# Patient Record
Sex: Male | Born: 1935 | Race: White | Hispanic: No | Smoking: Former smoker
Health system: Southern US, Community
[De-identification: ages and names within clinical notes are randomized; demographics above are authoritative.]

## PROBLEM LIST (undated history)

## (undated) DIAGNOSIS — R0600 Dyspnea, unspecified: Secondary | ICD-10-CM

## (undated) DIAGNOSIS — R234 Changes in skin texture: Secondary | ICD-10-CM

## (undated) DIAGNOSIS — E7800 Pure hypercholesterolemia, unspecified: Secondary | ICD-10-CM

## (undated) DIAGNOSIS — F40240 Claustrophobia: Secondary | ICD-10-CM

## (undated) DIAGNOSIS — I219 Acute myocardial infarction, unspecified: Secondary | ICD-10-CM

## (undated) DIAGNOSIS — H269 Unspecified cataract: Secondary | ICD-10-CM

## (undated) DIAGNOSIS — I10 Essential (primary) hypertension: Secondary | ICD-10-CM

## (undated) DIAGNOSIS — H9319 Tinnitus, unspecified ear: Secondary | ICD-10-CM

## (undated) DIAGNOSIS — K219 Gastro-esophageal reflux disease without esophagitis: Secondary | ICD-10-CM

## (undated) DIAGNOSIS — F32A Depression, unspecified: Secondary | ICD-10-CM

## (undated) DIAGNOSIS — M1990 Unspecified osteoarthritis, unspecified site: Secondary | ICD-10-CM

## (undated) DIAGNOSIS — I351 Nonrheumatic aortic (valve) insufficiency: Secondary | ICD-10-CM

## (undated) DIAGNOSIS — I2510 Atherosclerotic heart disease of native coronary artery without angina pectoris: Secondary | ICD-10-CM

## (undated) DIAGNOSIS — F329 Major depressive disorder, single episode, unspecified: Secondary | ICD-10-CM

## (undated) DIAGNOSIS — E210 Primary hyperparathyroidism: Secondary | ICD-10-CM

## (undated) DIAGNOSIS — E039 Hypothyroidism, unspecified: Secondary | ICD-10-CM

## (undated) DIAGNOSIS — Z87442 Personal history of urinary calculi: Secondary | ICD-10-CM

## (undated) DIAGNOSIS — K8020 Calculus of gallbladder without cholecystitis without obstruction: Secondary | ICD-10-CM

## (undated) DIAGNOSIS — F419 Anxiety disorder, unspecified: Secondary | ICD-10-CM

## (undated) DIAGNOSIS — N400 Enlarged prostate without lower urinary tract symptoms: Secondary | ICD-10-CM

## (undated) HISTORY — PX: OTHER SURGICAL HISTORY: SHX169

## (undated) HISTORY — PX: CORONARY ANGIOPLASTY: SHX604

## (undated) HISTORY — PX: TONSILLECTOMY: SUR1361

## (undated) HISTORY — PX: EYE SURGERY: SHX253

---

## 2015-08-29 DIAGNOSIS — E039 Hypothyroidism, unspecified: Secondary | ICD-10-CM | POA: Diagnosis not present

## 2015-08-29 DIAGNOSIS — Z9181 History of falling: Secondary | ICD-10-CM | POA: Diagnosis not present

## 2015-08-29 DIAGNOSIS — R079 Chest pain, unspecified: Secondary | ICD-10-CM | POA: Diagnosis not present

## 2015-08-29 DIAGNOSIS — I10 Essential (primary) hypertension: Secondary | ICD-10-CM | POA: Diagnosis not present

## 2015-08-29 DIAGNOSIS — Z0000 Encounter for general adult medical examination without abnormal findings: Secondary | ICD-10-CM | POA: Diagnosis not present

## 2015-09-10 DIAGNOSIS — R079 Chest pain, unspecified: Secondary | ICD-10-CM | POA: Diagnosis not present

## 2015-09-16 DIAGNOSIS — Z23 Encounter for immunization: Secondary | ICD-10-CM | POA: Diagnosis not present

## 2015-09-16 DIAGNOSIS — R141 Gas pain: Secondary | ICD-10-CM | POA: Diagnosis not present

## 2016-01-07 DIAGNOSIS — M1712 Unilateral primary osteoarthritis, left knee: Secondary | ICD-10-CM | POA: Diagnosis not present

## 2016-02-05 DIAGNOSIS — M1712 Unilateral primary osteoarthritis, left knee: Secondary | ICD-10-CM | POA: Diagnosis not present

## 2016-02-10 DIAGNOSIS — H04203 Unspecified epiphora, bilateral lacrimal glands: Secondary | ICD-10-CM | POA: Diagnosis not present

## 2016-02-10 DIAGNOSIS — H35373 Puckering of macula, bilateral: Secondary | ICD-10-CM | POA: Diagnosis not present

## 2016-03-04 DIAGNOSIS — H04123 Dry eye syndrome of bilateral lacrimal glands: Secondary | ICD-10-CM | POA: Diagnosis not present

## 2016-03-17 DIAGNOSIS — L309 Dermatitis, unspecified: Secondary | ICD-10-CM | POA: Diagnosis not present

## 2016-03-25 DIAGNOSIS — L309 Dermatitis, unspecified: Secondary | ICD-10-CM | POA: Diagnosis not present

## 2016-04-14 DIAGNOSIS — H0289 Other specified disorders of eyelid: Secondary | ICD-10-CM | POA: Diagnosis not present

## 2016-04-14 DIAGNOSIS — L309 Dermatitis, unspecified: Secondary | ICD-10-CM | POA: Diagnosis not present

## 2016-04-14 DIAGNOSIS — H04223 Epiphora due to insufficient drainage, bilateral lacrimal glands: Secondary | ICD-10-CM | POA: Diagnosis not present

## 2016-04-14 DIAGNOSIS — H02105 Unspecified ectropion of left lower eyelid: Secondary | ICD-10-CM | POA: Diagnosis not present

## 2016-04-14 DIAGNOSIS — H02102 Unspecified ectropion of right lower eyelid: Secondary | ICD-10-CM | POA: Diagnosis not present

## 2016-04-14 DIAGNOSIS — H02223 Mechanical lagophthalmos right eye, unspecified eyelid: Secondary | ICD-10-CM | POA: Diagnosis not present

## 2016-04-30 DIAGNOSIS — E039 Hypothyroidism, unspecified: Secondary | ICD-10-CM | POA: Diagnosis not present

## 2016-04-30 DIAGNOSIS — R5383 Other fatigue: Secondary | ICD-10-CM | POA: Diagnosis not present

## 2016-04-30 DIAGNOSIS — E785 Hyperlipidemia, unspecified: Secondary | ICD-10-CM | POA: Diagnosis not present

## 2016-04-30 DIAGNOSIS — Z79899 Other long term (current) drug therapy: Secondary | ICD-10-CM | POA: Diagnosis not present

## 2016-04-30 DIAGNOSIS — Z0000 Encounter for general adult medical examination without abnormal findings: Secondary | ICD-10-CM | POA: Diagnosis not present

## 2016-04-30 DIAGNOSIS — D510 Vitamin B12 deficiency anemia due to intrinsic factor deficiency: Secondary | ICD-10-CM | POA: Diagnosis not present

## 2016-05-08 DIAGNOSIS — H02102 Unspecified ectropion of right lower eyelid: Secondary | ICD-10-CM | POA: Diagnosis not present

## 2016-05-08 DIAGNOSIS — H02105 Unspecified ectropion of left lower eyelid: Secondary | ICD-10-CM | POA: Diagnosis not present

## 2016-05-14 DIAGNOSIS — I10 Essential (primary) hypertension: Secondary | ICD-10-CM | POA: Diagnosis not present

## 2016-05-21 DIAGNOSIS — I10 Essential (primary) hypertension: Secondary | ICD-10-CM | POA: Diagnosis not present

## 2016-05-28 DIAGNOSIS — I10 Essential (primary) hypertension: Secondary | ICD-10-CM | POA: Diagnosis not present

## 2016-05-28 DIAGNOSIS — N451 Epididymitis: Secondary | ICD-10-CM | POA: Diagnosis not present

## 2016-05-28 DIAGNOSIS — N289 Disorder of kidney and ureter, unspecified: Secondary | ICD-10-CM | POA: Diagnosis not present

## 2016-05-29 DIAGNOSIS — R1011 Right upper quadrant pain: Secondary | ICD-10-CM | POA: Diagnosis not present

## 2016-05-29 DIAGNOSIS — K760 Fatty (change of) liver, not elsewhere classified: Secondary | ICD-10-CM | POA: Diagnosis not present

## 2016-05-29 DIAGNOSIS — R1031 Right lower quadrant pain: Secondary | ICD-10-CM | POA: Diagnosis not present

## 2016-05-29 DIAGNOSIS — R1013 Epigastric pain: Secondary | ICD-10-CM | POA: Diagnosis not present

## 2016-05-31 DIAGNOSIS — Z87891 Personal history of nicotine dependence: Secondary | ICD-10-CM | POA: Diagnosis not present

## 2016-05-31 DIAGNOSIS — K219 Gastro-esophageal reflux disease without esophagitis: Secondary | ICD-10-CM

## 2016-05-31 DIAGNOSIS — R0602 Shortness of breath: Secondary | ICD-10-CM | POA: Diagnosis not present

## 2016-05-31 DIAGNOSIS — R109 Unspecified abdominal pain: Secondary | ICD-10-CM | POA: Diagnosis not present

## 2016-05-31 DIAGNOSIS — I10 Essential (primary) hypertension: Secondary | ICD-10-CM | POA: Diagnosis not present

## 2016-05-31 DIAGNOSIS — R072 Precordial pain: Secondary | ICD-10-CM | POA: Diagnosis not present

## 2016-05-31 DIAGNOSIS — Z2821 Immunization not carried out because of patient refusal: Secondary | ICD-10-CM | POA: Diagnosis not present

## 2016-05-31 DIAGNOSIS — R7989 Other specified abnormal findings of blood chemistry: Secondary | ICD-10-CM | POA: Diagnosis not present

## 2016-05-31 DIAGNOSIS — E7800 Pure hypercholesterolemia, unspecified: Secondary | ICD-10-CM | POA: Diagnosis not present

## 2016-05-31 DIAGNOSIS — R079 Chest pain, unspecified: Secondary | ICD-10-CM | POA: Diagnosis not present

## 2016-05-31 DIAGNOSIS — Z7982 Long term (current) use of aspirin: Secondary | ICD-10-CM | POA: Diagnosis not present

## 2016-05-31 DIAGNOSIS — Z8679 Personal history of other diseases of the circulatory system: Secondary | ICD-10-CM | POA: Diagnosis not present

## 2016-05-31 DIAGNOSIS — K760 Fatty (change of) liver, not elsewhere classified: Secondary | ICD-10-CM | POA: Diagnosis not present

## 2016-05-31 DIAGNOSIS — R001 Bradycardia, unspecified: Secondary | ICD-10-CM | POA: Diagnosis not present

## 2016-05-31 DIAGNOSIS — E039 Hypothyroidism, unspecified: Secondary | ICD-10-CM | POA: Diagnosis not present

## 2016-05-31 DIAGNOSIS — Z79899 Other long term (current) drug therapy: Secondary | ICD-10-CM | POA: Diagnosis not present

## 2016-06-01 DIAGNOSIS — Z79899 Other long term (current) drug therapy: Secondary | ICD-10-CM | POA: Diagnosis not present

## 2016-06-01 DIAGNOSIS — M1990 Unspecified osteoarthritis, unspecified site: Secondary | ICD-10-CM | POA: Diagnosis not present

## 2016-06-01 DIAGNOSIS — Z87891 Personal history of nicotine dependence: Secondary | ICD-10-CM | POA: Diagnosis not present

## 2016-06-01 DIAGNOSIS — E7800 Pure hypercholesterolemia, unspecified: Secondary | ICD-10-CM | POA: Diagnosis not present

## 2016-06-01 DIAGNOSIS — I25118 Atherosclerotic heart disease of native coronary artery with other forms of angina pectoris: Secondary | ICD-10-CM | POA: Diagnosis not present

## 2016-06-01 DIAGNOSIS — I10 Essential (primary) hypertension: Secondary | ICD-10-CM | POA: Diagnosis not present

## 2016-06-01 DIAGNOSIS — R0602 Shortness of breath: Secondary | ICD-10-CM | POA: Diagnosis not present

## 2016-06-01 DIAGNOSIS — R001 Bradycardia, unspecified: Secondary | ICD-10-CM | POA: Diagnosis not present

## 2016-06-01 DIAGNOSIS — E039 Hypothyroidism, unspecified: Secondary | ICD-10-CM | POA: Diagnosis not present

## 2016-06-01 DIAGNOSIS — K219 Gastro-esophageal reflux disease without esophagitis: Secondary | ICD-10-CM | POA: Diagnosis not present

## 2016-06-01 DIAGNOSIS — I200 Unstable angina: Secondary | ICD-10-CM | POA: Diagnosis not present

## 2016-06-01 DIAGNOSIS — Z2821 Immunization not carried out because of patient refusal: Secondary | ICD-10-CM | POA: Diagnosis not present

## 2016-06-01 DIAGNOSIS — I25119 Atherosclerotic heart disease of native coronary artery with unspecified angina pectoris: Secondary | ICD-10-CM | POA: Diagnosis not present

## 2016-06-01 DIAGNOSIS — Z8679 Personal history of other diseases of the circulatory system: Secondary | ICD-10-CM | POA: Diagnosis not present

## 2016-06-01 DIAGNOSIS — R079 Chest pain, unspecified: Secondary | ICD-10-CM | POA: Diagnosis not present

## 2016-06-01 DIAGNOSIS — K760 Fatty (change of) liver, not elsewhere classified: Secondary | ICD-10-CM | POA: Diagnosis not present

## 2016-06-01 DIAGNOSIS — R7989 Other specified abnormal findings of blood chemistry: Secondary | ICD-10-CM | POA: Diagnosis not present

## 2016-06-01 DIAGNOSIS — R109 Unspecified abdominal pain: Secondary | ICD-10-CM | POA: Diagnosis not present

## 2016-06-01 DIAGNOSIS — Z7982 Long term (current) use of aspirin: Secondary | ICD-10-CM | POA: Diagnosis not present

## 2016-06-05 DIAGNOSIS — G4700 Insomnia, unspecified: Secondary | ICD-10-CM | POA: Diagnosis not present

## 2016-06-05 DIAGNOSIS — I259 Chronic ischemic heart disease, unspecified: Secondary | ICD-10-CM | POA: Diagnosis not present

## 2016-06-05 DIAGNOSIS — N289 Disorder of kidney and ureter, unspecified: Secondary | ICD-10-CM | POA: Diagnosis not present

## 2016-06-05 DIAGNOSIS — K760 Fatty (change of) liver, not elsewhere classified: Secondary | ICD-10-CM | POA: Diagnosis not present

## 2016-06-13 HISTORY — PX: OTHER SURGICAL HISTORY: SHX169

## 2016-06-17 DIAGNOSIS — K219 Gastro-esophageal reflux disease without esophagitis: Secondary | ICD-10-CM | POA: Diagnosis not present

## 2016-06-18 DIAGNOSIS — H25811 Combined forms of age-related cataract, right eye: Secondary | ICD-10-CM | POA: Diagnosis not present

## 2016-06-19 DIAGNOSIS — R001 Bradycardia, unspecified: Secondary | ICD-10-CM | POA: Diagnosis not present

## 2016-06-25 DIAGNOSIS — I209 Angina pectoris, unspecified: Secondary | ICD-10-CM | POA: Diagnosis not present

## 2016-06-25 DIAGNOSIS — E782 Mixed hyperlipidemia: Secondary | ICD-10-CM | POA: Diagnosis not present

## 2016-06-25 DIAGNOSIS — I25119 Atherosclerotic heart disease of native coronary artery with unspecified angina pectoris: Secondary | ICD-10-CM | POA: Diagnosis not present

## 2016-06-25 DIAGNOSIS — I10 Essential (primary) hypertension: Secondary | ICD-10-CM | POA: Diagnosis not present

## 2016-06-25 DIAGNOSIS — I351 Nonrheumatic aortic (valve) insufficiency: Secondary | ICD-10-CM | POA: Diagnosis not present

## 2016-06-30 DIAGNOSIS — R001 Bradycardia, unspecified: Secondary | ICD-10-CM | POA: Diagnosis not present

## 2016-07-01 DIAGNOSIS — R001 Bradycardia, unspecified: Secondary | ICD-10-CM | POA: Diagnosis not present

## 2016-07-06 DIAGNOSIS — I10 Essential (primary) hypertension: Secondary | ICD-10-CM | POA: Diagnosis not present

## 2016-07-06 DIAGNOSIS — M47812 Spondylosis without myelopathy or radiculopathy, cervical region: Secondary | ICD-10-CM | POA: Diagnosis not present

## 2016-08-06 DIAGNOSIS — I10 Essential (primary) hypertension: Secondary | ICD-10-CM | POA: Diagnosis not present

## 2016-08-06 DIAGNOSIS — I25119 Atherosclerotic heart disease of native coronary artery with unspecified angina pectoris: Secondary | ICD-10-CM | POA: Diagnosis not present

## 2016-08-06 DIAGNOSIS — G4733 Obstructive sleep apnea (adult) (pediatric): Secondary | ICD-10-CM | POA: Diagnosis not present

## 2016-08-06 DIAGNOSIS — E782 Mixed hyperlipidemia: Secondary | ICD-10-CM | POA: Diagnosis not present

## 2016-08-06 DIAGNOSIS — I351 Nonrheumatic aortic (valve) insufficiency: Secondary | ICD-10-CM | POA: Diagnosis not present

## 2016-08-10 DIAGNOSIS — H2511 Age-related nuclear cataract, right eye: Secondary | ICD-10-CM | POA: Diagnosis not present

## 2016-08-10 DIAGNOSIS — H25811 Combined forms of age-related cataract, right eye: Secondary | ICD-10-CM | POA: Diagnosis not present

## 2016-08-26 DIAGNOSIS — K760 Fatty (change of) liver, not elsewhere classified: Secondary | ICD-10-CM | POA: Diagnosis not present

## 2016-08-31 DIAGNOSIS — Z23 Encounter for immunization: Secondary | ICD-10-CM | POA: Diagnosis not present

## 2016-08-31 DIAGNOSIS — E785 Hyperlipidemia, unspecified: Secondary | ICD-10-CM | POA: Diagnosis not present

## 2016-08-31 DIAGNOSIS — Z0000 Encounter for general adult medical examination without abnormal findings: Secondary | ICD-10-CM | POA: Diagnosis not present

## 2016-08-31 DIAGNOSIS — N289 Disorder of kidney and ureter, unspecified: Secondary | ICD-10-CM | POA: Diagnosis not present

## 2016-09-08 DIAGNOSIS — R011 Cardiac murmur, unspecified: Secondary | ICD-10-CM | POA: Diagnosis not present

## 2016-09-11 DIAGNOSIS — H04123 Dry eye syndrome of bilateral lacrimal glands: Secondary | ICD-10-CM | POA: Diagnosis not present

## 2016-09-15 DIAGNOSIS — I351 Nonrheumatic aortic (valve) insufficiency: Secondary | ICD-10-CM | POA: Diagnosis not present

## 2016-09-15 DIAGNOSIS — G4733 Obstructive sleep apnea (adult) (pediatric): Secondary | ICD-10-CM | POA: Diagnosis not present

## 2016-09-15 DIAGNOSIS — I25119 Atherosclerotic heart disease of native coronary artery with unspecified angina pectoris: Secondary | ICD-10-CM | POA: Diagnosis not present

## 2016-09-15 DIAGNOSIS — E782 Mixed hyperlipidemia: Secondary | ICD-10-CM | POA: Diagnosis not present

## 2016-09-15 DIAGNOSIS — I10 Essential (primary) hypertension: Secondary | ICD-10-CM | POA: Diagnosis not present

## 2016-09-16 DIAGNOSIS — K219 Gastro-esophageal reflux disease without esophagitis: Secondary | ICD-10-CM | POA: Diagnosis not present

## 2016-09-18 DIAGNOSIS — I10 Essential (primary) hypertension: Secondary | ICD-10-CM | POA: Diagnosis not present

## 2016-09-18 DIAGNOSIS — G4733 Obstructive sleep apnea (adult) (pediatric): Secondary | ICD-10-CM | POA: Diagnosis not present

## 2016-09-18 DIAGNOSIS — I25119 Atherosclerotic heart disease of native coronary artery with unspecified angina pectoris: Secondary | ICD-10-CM | POA: Diagnosis not present

## 2016-09-18 DIAGNOSIS — I351 Nonrheumatic aortic (valve) insufficiency: Secondary | ICD-10-CM | POA: Diagnosis not present

## 2016-09-18 DIAGNOSIS — E782 Mixed hyperlipidemia: Secondary | ICD-10-CM | POA: Diagnosis not present

## 2016-09-24 DIAGNOSIS — I25119 Atherosclerotic heart disease of native coronary artery with unspecified angina pectoris: Secondary | ICD-10-CM | POA: Diagnosis not present

## 2016-10-12 DIAGNOSIS — I25119 Atherosclerotic heart disease of native coronary artery with unspecified angina pectoris: Secondary | ICD-10-CM | POA: Diagnosis not present

## 2016-10-12 DIAGNOSIS — I351 Nonrheumatic aortic (valve) insufficiency: Secondary | ICD-10-CM | POA: Diagnosis not present

## 2016-10-12 DIAGNOSIS — E782 Mixed hyperlipidemia: Secondary | ICD-10-CM | POA: Diagnosis not present

## 2016-10-12 DIAGNOSIS — I209 Angina pectoris, unspecified: Secondary | ICD-10-CM | POA: Diagnosis not present

## 2016-10-12 DIAGNOSIS — I10 Essential (primary) hypertension: Secondary | ICD-10-CM | POA: Diagnosis not present

## 2016-10-21 DIAGNOSIS — R001 Bradycardia, unspecified: Secondary | ICD-10-CM | POA: Diagnosis not present

## 2016-10-23 DIAGNOSIS — I209 Angina pectoris, unspecified: Secondary | ICD-10-CM | POA: Diagnosis not present

## 2016-10-23 DIAGNOSIS — I25119 Atherosclerotic heart disease of native coronary artery with unspecified angina pectoris: Secondary | ICD-10-CM | POA: Diagnosis not present

## 2016-10-23 DIAGNOSIS — E782 Mixed hyperlipidemia: Secondary | ICD-10-CM | POA: Diagnosis not present

## 2016-10-23 DIAGNOSIS — I10 Essential (primary) hypertension: Secondary | ICD-10-CM | POA: Diagnosis not present

## 2016-10-23 DIAGNOSIS — I351 Nonrheumatic aortic (valve) insufficiency: Secondary | ICD-10-CM | POA: Diagnosis not present

## 2016-11-04 DIAGNOSIS — H04223 Epiphora due to insufficient drainage, bilateral lacrimal glands: Secondary | ICD-10-CM | POA: Diagnosis not present

## 2016-11-04 DIAGNOSIS — H04551 Acquired stenosis of right nasolacrimal duct: Secondary | ICD-10-CM | POA: Diagnosis not present

## 2016-11-25 DIAGNOSIS — I25119 Atherosclerotic heart disease of native coronary artery with unspecified angina pectoris: Secondary | ICD-10-CM | POA: Diagnosis not present

## 2016-11-25 DIAGNOSIS — I10 Essential (primary) hypertension: Secondary | ICD-10-CM | POA: Diagnosis not present

## 2016-11-25 DIAGNOSIS — I351 Nonrheumatic aortic (valve) insufficiency: Secondary | ICD-10-CM | POA: Diagnosis not present

## 2016-11-25 DIAGNOSIS — E782 Mixed hyperlipidemia: Secondary | ICD-10-CM | POA: Diagnosis not present

## 2016-12-01 DIAGNOSIS — I10 Essential (primary) hypertension: Secondary | ICD-10-CM | POA: Diagnosis not present

## 2016-12-01 DIAGNOSIS — I25119 Atherosclerotic heart disease of native coronary artery with unspecified angina pectoris: Secondary | ICD-10-CM | POA: Diagnosis not present

## 2016-12-01 DIAGNOSIS — E782 Mixed hyperlipidemia: Secondary | ICD-10-CM | POA: Diagnosis not present

## 2016-12-01 DIAGNOSIS — I351 Nonrheumatic aortic (valve) insufficiency: Secondary | ICD-10-CM | POA: Diagnosis not present

## 2016-12-22 DIAGNOSIS — G4733 Obstructive sleep apnea (adult) (pediatric): Secondary | ICD-10-CM | POA: Diagnosis not present

## 2016-12-22 DIAGNOSIS — R001 Bradycardia, unspecified: Secondary | ICD-10-CM | POA: Diagnosis not present

## 2016-12-22 DIAGNOSIS — I351 Nonrheumatic aortic (valve) insufficiency: Secondary | ICD-10-CM | POA: Diagnosis not present

## 2016-12-22 DIAGNOSIS — I25119 Atherosclerotic heart disease of native coronary artery with unspecified angina pectoris: Secondary | ICD-10-CM | POA: Diagnosis not present

## 2016-12-22 DIAGNOSIS — I10 Essential (primary) hypertension: Secondary | ICD-10-CM | POA: Diagnosis not present

## 2017-01-07 DIAGNOSIS — C4441 Basal cell carcinoma of skin of scalp and neck: Secondary | ICD-10-CM | POA: Diagnosis not present

## 2017-01-07 DIAGNOSIS — L570 Actinic keratosis: Secondary | ICD-10-CM | POA: Diagnosis not present

## 2017-01-07 DIAGNOSIS — L820 Inflamed seborrheic keratosis: Secondary | ICD-10-CM | POA: Diagnosis not present

## 2017-01-25 DIAGNOSIS — K219 Gastro-esophageal reflux disease without esophagitis: Secondary | ICD-10-CM | POA: Diagnosis not present

## 2017-01-26 DIAGNOSIS — H04551 Acquired stenosis of right nasolacrimal duct: Secondary | ICD-10-CM | POA: Diagnosis not present

## 2017-01-26 DIAGNOSIS — H04221 Epiphora due to insufficient drainage, right lacrimal gland: Secondary | ICD-10-CM | POA: Diagnosis not present

## 2017-01-26 DIAGNOSIS — Z01818 Encounter for other preprocedural examination: Secondary | ICD-10-CM | POA: Diagnosis not present

## 2017-01-26 DIAGNOSIS — H0489 Other disorders of lacrimal system: Secondary | ICD-10-CM | POA: Diagnosis not present

## 2017-01-28 DIAGNOSIS — E782 Mixed hyperlipidemia: Secondary | ICD-10-CM | POA: Diagnosis not present

## 2017-01-28 DIAGNOSIS — I10 Essential (primary) hypertension: Secondary | ICD-10-CM | POA: Diagnosis not present

## 2017-01-28 DIAGNOSIS — G4733 Obstructive sleep apnea (adult) (pediatric): Secondary | ICD-10-CM | POA: Diagnosis not present

## 2017-01-28 DIAGNOSIS — I25119 Atherosclerotic heart disease of native coronary artery with unspecified angina pectoris: Secondary | ICD-10-CM | POA: Diagnosis not present

## 2017-02-10 DIAGNOSIS — Z7901 Long term (current) use of anticoagulants: Secondary | ICD-10-CM | POA: Diagnosis not present

## 2017-02-10 DIAGNOSIS — K760 Fatty (change of) liver, not elsewhere classified: Secondary | ICD-10-CM | POA: Diagnosis not present

## 2017-02-16 DIAGNOSIS — L400 Psoriasis vulgaris: Secondary | ICD-10-CM | POA: Diagnosis not present

## 2017-02-16 DIAGNOSIS — L821 Other seborrheic keratosis: Secondary | ICD-10-CM | POA: Diagnosis not present

## 2017-02-16 DIAGNOSIS — L578 Other skin changes due to chronic exposure to nonionizing radiation: Secondary | ICD-10-CM | POA: Diagnosis not present

## 2017-02-16 DIAGNOSIS — L570 Actinic keratosis: Secondary | ICD-10-CM | POA: Diagnosis not present

## 2017-02-16 DIAGNOSIS — C44319 Basal cell carcinoma of skin of other parts of face: Secondary | ICD-10-CM | POA: Diagnosis not present

## 2017-02-18 DIAGNOSIS — I25119 Atherosclerotic heart disease of native coronary artery with unspecified angina pectoris: Secondary | ICD-10-CM | POA: Diagnosis not present

## 2017-02-18 DIAGNOSIS — E039 Hypothyroidism, unspecified: Secondary | ICD-10-CM | POA: Diagnosis not present

## 2017-02-18 DIAGNOSIS — I10 Essential (primary) hypertension: Secondary | ICD-10-CM | POA: Diagnosis not present

## 2017-02-18 DIAGNOSIS — I340 Nonrheumatic mitral (valve) insufficiency: Secondary | ICD-10-CM | POA: Diagnosis not present

## 2017-02-18 DIAGNOSIS — E782 Mixed hyperlipidemia: Secondary | ICD-10-CM | POA: Diagnosis not present

## 2017-02-23 DIAGNOSIS — Z9181 History of falling: Secondary | ICD-10-CM | POA: Diagnosis not present

## 2017-02-23 DIAGNOSIS — I10 Essential (primary) hypertension: Secondary | ICD-10-CM | POA: Diagnosis not present

## 2017-02-23 DIAGNOSIS — N23 Unspecified renal colic: Secondary | ICD-10-CM | POA: Diagnosis not present

## 2017-02-23 DIAGNOSIS — Z23 Encounter for immunization: Secondary | ICD-10-CM | POA: Diagnosis not present

## 2017-02-23 DIAGNOSIS — K760 Fatty (change of) liver, not elsewhere classified: Secondary | ICD-10-CM | POA: Diagnosis not present

## 2017-03-04 DIAGNOSIS — N289 Disorder of kidney and ureter, unspecified: Secondary | ICD-10-CM | POA: Diagnosis not present

## 2017-03-22 DIAGNOSIS — K760 Fatty (change of) liver, not elsewhere classified: Secondary | ICD-10-CM | POA: Diagnosis not present

## 2017-04-26 DIAGNOSIS — K219 Gastro-esophageal reflux disease without esophagitis: Secondary | ICD-10-CM | POA: Diagnosis not present

## 2017-04-26 DIAGNOSIS — K760 Fatty (change of) liver, not elsewhere classified: Secondary | ICD-10-CM | POA: Diagnosis not present

## 2017-05-11 DIAGNOSIS — Z0000 Encounter for general adult medical examination without abnormal findings: Secondary | ICD-10-CM | POA: Diagnosis not present

## 2017-05-11 DIAGNOSIS — I259 Chronic ischemic heart disease, unspecified: Secondary | ICD-10-CM | POA: Diagnosis not present

## 2017-05-11 DIAGNOSIS — N189 Chronic kidney disease, unspecified: Secondary | ICD-10-CM | POA: Diagnosis not present

## 2017-05-21 DIAGNOSIS — I25119 Atherosclerotic heart disease of native coronary artery with unspecified angina pectoris: Secondary | ICD-10-CM | POA: Diagnosis not present

## 2017-05-21 DIAGNOSIS — I340 Nonrheumatic mitral (valve) insufficiency: Secondary | ICD-10-CM | POA: Diagnosis not present

## 2017-05-26 DIAGNOSIS — N189 Chronic kidney disease, unspecified: Secondary | ICD-10-CM | POA: Diagnosis not present

## 2017-05-31 DIAGNOSIS — C44319 Basal cell carcinoma of skin of other parts of face: Secondary | ICD-10-CM | POA: Diagnosis not present

## 2017-05-31 DIAGNOSIS — I781 Nevus, non-neoplastic: Secondary | ICD-10-CM | POA: Diagnosis not present

## 2017-05-31 DIAGNOSIS — L820 Inflamed seborrheic keratosis: Secondary | ICD-10-CM | POA: Diagnosis not present

## 2017-06-03 DIAGNOSIS — G4733 Obstructive sleep apnea (adult) (pediatric): Secondary | ICD-10-CM | POA: Diagnosis not present

## 2017-06-03 DIAGNOSIS — E782 Mixed hyperlipidemia: Secondary | ICD-10-CM | POA: Diagnosis not present

## 2017-06-03 DIAGNOSIS — I25119 Atherosclerotic heart disease of native coronary artery with unspecified angina pectoris: Secondary | ICD-10-CM | POA: Diagnosis not present

## 2017-06-03 DIAGNOSIS — I340 Nonrheumatic mitral (valve) insufficiency: Secondary | ICD-10-CM | POA: Diagnosis not present

## 2017-06-03 DIAGNOSIS — I10 Essential (primary) hypertension: Secondary | ICD-10-CM | POA: Diagnosis not present

## 2017-06-14 DIAGNOSIS — N200 Calculus of kidney: Secondary | ICD-10-CM | POA: Diagnosis not present

## 2017-06-14 DIAGNOSIS — I2510 Atherosclerotic heart disease of native coronary artery without angina pectoris: Secondary | ICD-10-CM | POA: Diagnosis not present

## 2017-06-14 DIAGNOSIS — N400 Enlarged prostate without lower urinary tract symptoms: Secondary | ICD-10-CM | POA: Diagnosis not present

## 2017-06-14 DIAGNOSIS — I340 Nonrheumatic mitral (valve) insufficiency: Secondary | ICD-10-CM | POA: Diagnosis not present

## 2017-06-14 DIAGNOSIS — N183 Chronic kidney disease, stage 3 (moderate): Secondary | ICD-10-CM | POA: Diagnosis not present

## 2017-06-22 DIAGNOSIS — N400 Enlarged prostate without lower urinary tract symptoms: Secondary | ICD-10-CM | POA: Diagnosis not present

## 2017-06-22 DIAGNOSIS — K7581 Nonalcoholic steatohepatitis (NASH): Secondary | ICD-10-CM | POA: Diagnosis not present

## 2017-06-22 DIAGNOSIS — I2510 Atherosclerotic heart disease of native coronary artery without angina pectoris: Secondary | ICD-10-CM | POA: Diagnosis not present

## 2017-06-22 DIAGNOSIS — N183 Chronic kidney disease, stage 3 (moderate): Secondary | ICD-10-CM | POA: Diagnosis not present

## 2017-06-22 DIAGNOSIS — Z87442 Personal history of urinary calculi: Secondary | ICD-10-CM | POA: Diagnosis not present

## 2017-06-30 DIAGNOSIS — N183 Chronic kidney disease, stage 3 (moderate): Secondary | ICD-10-CM | POA: Diagnosis not present

## 2017-07-05 DIAGNOSIS — H04221 Epiphora due to insufficient drainage, right lacrimal gland: Secondary | ICD-10-CM | POA: Diagnosis not present

## 2017-07-07 DIAGNOSIS — N390 Urinary tract infection, site not specified: Secondary | ICD-10-CM | POA: Diagnosis not present

## 2017-07-27 DIAGNOSIS — Z01818 Encounter for other preprocedural examination: Secondary | ICD-10-CM | POA: Diagnosis not present

## 2017-07-27 DIAGNOSIS — H04551 Acquired stenosis of right nasolacrimal duct: Secondary | ICD-10-CM | POA: Diagnosis not present

## 2017-08-06 DIAGNOSIS — E210 Primary hyperparathyroidism: Secondary | ICD-10-CM | POA: Diagnosis not present

## 2017-08-09 ENCOUNTER — Other Ambulatory Visit (HOSPITAL_COMMUNITY): Payer: Self-pay | Admitting: Surgery

## 2017-08-09 DIAGNOSIS — E210 Primary hyperparathyroidism: Secondary | ICD-10-CM | POA: Diagnosis not present

## 2017-08-19 ENCOUNTER — Encounter (HOSPITAL_COMMUNITY)
Admission: RE | Admit: 2017-08-19 | Discharge: 2017-08-19 | Disposition: A | Discharge status: Home / Self Care | Payer: Medicare Other | Source: Ambulatory Visit | Attending: Surgery | Admitting: Surgery

## 2017-08-19 DIAGNOSIS — E210 Primary hyperparathyroidism: Secondary | ICD-10-CM | POA: Insufficient documentation

## 2017-08-19 MED ORDER — TECHNETIUM TC 99M SESTAMIBI GENERIC - CARDIOLITE
25.0000 | Freq: Once | INTRAVENOUS | Status: AC | PRN
  Administered 2017-08-19: 25 via INTRAVENOUS

## 2017-08-25 ENCOUNTER — Ambulatory Visit: Payer: Self-pay | Admitting: Surgery

## 2017-08-30 ENCOUNTER — Other Ambulatory Visit: Payer: Self-pay | Admitting: Pharmacist

## 2017-08-30 NOTE — Patient Outreach (Signed)
Incoming call from Ryan Mendoza in response to the [facility] Medication Adherence Campaign. Ryan Mendoza reports that he takes his atorvastatin once daily as directed. Denies any missed doses or barriers to medication adherence. Counsel patient on medication adherence. Also counsel patient on benefits of using mail order pharmacy for cost savings. Patient verbalizes understanding.  Ryan Mendoza denies any medication questions/concerns at this time.  [name], PharmD, [place] [facility] [phone]

## 2017-09-02 DIAGNOSIS — E039 Hypothyroidism, unspecified: Secondary | ICD-10-CM | POA: Diagnosis not present

## 2017-09-02 DIAGNOSIS — Z79899 Other long term (current) drug therapy: Secondary | ICD-10-CM | POA: Diagnosis not present

## 2017-09-02 DIAGNOSIS — Z23 Encounter for immunization: Secondary | ICD-10-CM | POA: Diagnosis not present

## 2017-09-02 DIAGNOSIS — N189 Chronic kidney disease, unspecified: Secondary | ICD-10-CM | POA: Diagnosis not present

## 2017-09-02 DIAGNOSIS — Z0000 Encounter for general adult medical examination without abnormal findings: Secondary | ICD-10-CM | POA: Diagnosis not present

## 2017-09-20 DIAGNOSIS — I2510 Atherosclerotic heart disease of native coronary artery without angina pectoris: Secondary | ICD-10-CM | POA: Diagnosis not present

## 2017-09-20 DIAGNOSIS — N200 Calculus of kidney: Secondary | ICD-10-CM | POA: Diagnosis not present

## 2017-09-20 DIAGNOSIS — I129 Hypertensive chronic kidney disease with stage 1 through stage 4 chronic kidney disease, or unspecified chronic kidney disease: Secondary | ICD-10-CM | POA: Diagnosis not present

## 2017-09-20 DIAGNOSIS — E210 Primary hyperparathyroidism: Secondary | ICD-10-CM | POA: Diagnosis not present

## 2017-09-20 DIAGNOSIS — N183 Chronic kidney disease, stage 3 (moderate): Secondary | ICD-10-CM | POA: Diagnosis not present

## 2017-09-22 NOTE — Patient Instructions (Signed)
Ryan Mendoza  09/22/2017   Your procedure is scheduled on: 09-28-17   Report to [facility] Main  Entrance Take [place] Elevators to 3rd floor to  [place] at 5:30 AM.   Call this number if you have problems the morning of surgery [phone]    Remember: ONLY 1 PERSON MAY GO WITH YOU TO SHORT STAY TO GET  READY MORNING OF [place].  Do not eat food or drink liquids :After Midnight.     Take these medicines the morning of surgery with A SIP OF WATER: Levothyroxine (Synthroid)                                You may not have any metal on your body including hair pins and              Piercings  Do not wear jewelry, make-up, lotions, powders or perfumes, deodorant             Men may shave face and neck.   Do not bring valuables to the hospital. [place].  Contacts, dentures or bridgework may not be worn into surgery.  Leave suitcase in the car. After surgery it may be brought to your room.                 Please read over the following fact sheets you were given: _____________________________________________________________________             [facility] - Preparing for Surgery Before surgery, you can play an important role.  Because skin is not sterile, your skin needs to be as free of germs as possible.  You can reduce the number of germs on your skin by washing with CHG (chlorahexidine gluconate) soap before surgery.  CHG is an antiseptic cleaner which kills germs and bonds with the skin to continue killing germs even after washing. Please DO NOT use if you have an allergy to CHG or antibacterial soaps.  If your skin becomes reddened/irritated stop using the CHG and inform your nurse when you arrive at Short Stay. Do not shave (including legs and underarms) for at least 48 hours prior to the first CHG shower.  You may shave your face/neck. Please follow these instructions carefully:  1.   Shower with CHG Soap the night before surgery and the  morning of Surgery.  2.  If you choose to wash your hair, wash your hair first as usual with your  normal  shampoo.  3.  After you shampoo, rinse your hair and body thoroughly to remove the  shampoo.                           4.  Use CHG as you would any other liquid soap.  You can apply chg directly  to the skin and wash                       Gently with a scrungie or clean washcloth.  5.  Apply the CHG Soap to your body ONLY FROM THE NECK DOWN.   Do not use on face/ open  Wound or open sores. Avoid contact with eyes, ears mouth and genitals (private parts).                       Wash face,  Genitals (private parts) with your normal soap.             6.  Wash thoroughly, paying special attention to the area where your surgery  will be performed.  7.  Thoroughly rinse your body with warm water from the neck down.  8.  DO NOT shower/wash with your normal soap after using and rinsing off  the CHG Soap.                9.  Pat yourself dry with a clean towel.            10.  Wear clean pajamas.            11.  Place clean sheets on your bed the night of your first shower and do not  sleep with pets. Day of Surgery : Do not apply any lotions/deodorants the morning of surgery.  Please wear clean clothes to the hospital/surgery center.  FAILURE TO FOLLOW THESE INSTRUCTIONS MAY RESULT IN THE CANCELLATION OF YOUR SURGERY PATIENT SIGNATURE_________________________________  NURSE SIGNATURE__________________________________  ________________________________________________________________________

## 2017-09-23 ENCOUNTER — Encounter (HOSPITAL_COMMUNITY)
Admission: RE | Admit: 2017-09-23 | Discharge: 2017-09-23 | Disposition: A | Discharge status: Home / Self Care | Payer: Medicare Other | Source: Ambulatory Visit | Attending: Surgery | Admitting: Surgery

## 2017-09-23 DIAGNOSIS — E210 Primary hyperparathyroidism: Secondary | ICD-10-CM | POA: Insufficient documentation

## 2017-09-23 DIAGNOSIS — Z01812 Encounter for preprocedural laboratory examination: Secondary | ICD-10-CM | POA: Insufficient documentation

## 2017-09-23 DIAGNOSIS — I10 Essential (primary) hypertension: Secondary | ICD-10-CM | POA: Insufficient documentation

## 2017-09-24 ENCOUNTER — Encounter (HOSPITAL_COMMUNITY): Payer: Self-pay

## 2017-09-24 ENCOUNTER — Encounter (HOSPITAL_COMMUNITY)
Admission: RE | Admit: 2017-09-24 | Discharge: 2017-09-24 | Disposition: A | Discharge status: Home / Self Care | Payer: Medicare Other | Source: Ambulatory Visit | Attending: Surgery | Admitting: Surgery

## 2017-09-24 DIAGNOSIS — Z01812 Encounter for preprocedural laboratory examination: Secondary | ICD-10-CM | POA: Diagnosis not present

## 2017-09-24 DIAGNOSIS — I10 Essential (primary) hypertension: Secondary | ICD-10-CM | POA: Diagnosis not present

## 2017-09-24 DIAGNOSIS — E210 Primary hyperparathyroidism: Secondary | ICD-10-CM | POA: Diagnosis not present

## 2017-09-24 HISTORY — DX: Pure hypercholesterolemia, unspecified: E78.00

## 2017-09-24 HISTORY — DX: Atherosclerotic heart disease of native coronary artery without angina pectoris: I25.10

## 2017-09-24 HISTORY — DX: Gastro-esophageal reflux disease without esophagitis: K21.9

## 2017-09-24 HISTORY — DX: Personal history of urinary calculi: Z87.442

## 2017-09-24 HISTORY — DX: Benign prostatic hyperplasia without lower urinary tract symptoms: N40.0

## 2017-09-24 HISTORY — DX: Calculus of gallbladder without cholecystitis without obstruction: K80.20

## 2017-09-24 HISTORY — DX: Unspecified osteoarthritis, unspecified site: M19.90

## 2017-09-24 HISTORY — DX: Essential (primary) hypertension: I10

## 2017-09-24 HISTORY — DX: Changes in skin texture: R23.4

## 2017-09-24 HISTORY — DX: Anxiety disorder, unspecified: F41.9

## 2017-09-24 HISTORY — DX: Unspecified cataract: H26.9

## 2017-09-24 HISTORY — DX: Hypothyroidism, unspecified: E03.9

## 2017-09-24 HISTORY — DX: Primary hyperparathyroidism: E21.0

## 2017-09-24 HISTORY — DX: Major depressive disorder, single episode, unspecified: F32.9

## 2017-09-24 HISTORY — DX: Tinnitus, unspecified ear: H93.19

## 2017-09-24 HISTORY — DX: Depression, unspecified: F32.A

## 2017-09-24 HISTORY — DX: Nonrheumatic aortic (valve) insufficiency: I35.1

## 2017-09-24 LAB — BASIC METABOLIC PANEL
Anion gap: 6 (ref 5–15)
BUN: 24 mg/dL — AB (ref 6–20)
CHLORIDE: 107 mmol/L (ref 101–111)
CO2: 27 mmol/L (ref 22–32)
CREATININE: 1.25 mg/dL — AB (ref 0.61–1.24)
Calcium: 10.3 mg/dL (ref 8.9–10.3)
GFR calc Af Amer: >60 mL/min (ref >60)
GFR calc non Af Amer: 53 mL/min — ABNORMAL LOW (ref >60)
GLUCOSE: 111 mg/dL — AB (ref 65–99)
POTASSIUM: 4.3 mmol/L (ref 3.5–5.1)
Sodium: 140 mmol/L (ref 135–145)

## 2017-09-24 LAB — CBC
HEMATOCRIT: 42.5 % (ref 39.0–52.0)
Hemoglobin: 14.4 g/dL (ref 13.0–17.0)
MCH: 29.4 pg (ref 26.0–34.0)
MCHC: 33.9 g/dL (ref 30.0–36.0)
MCV: 86.9 fL (ref 78.0–100.0)
Platelets: 164 10*3/uL (ref 150–400)
RBC: 4.89 MIL/uL (ref 4.22–5.81)
RDW: 13.9 % (ref 11.5–15.5)
WBC: 7.5 10*3/uL (ref 4.0–10.5)

## 2017-09-24 NOTE — Patient Instructions (Signed)
Ryan Mendoza  09/24/2017   Your procedure is scheduled on: Tuesday 09-28-17  Report to [facility] Main  Entrance Take [place]  elevators to 3rd floor to  [place] at 530 AM.   Call this number if you have problems the morning of surgery [phone]  Remember: ONLY 1 PERSON MAY GO WITH YOU TO SHORT STAY TO GET  READY MORNING OF [place].  Do not eat food or drink liquids :After Midnight.     Take these medicines the morning of surgery with A SIP OF WATER: clonidine, atorvastatin, eye drop if needed                               You may not have any metal on your body including hair pins and              piercings  Do not wear jewelry, make-up, lotions, powders or perfumes, deodorant             Do not wear nail polish.  Do not shave  48 hours prior to surgery.              Men may shave face and neck.   Do not bring valuables to the hospital. [place].  Contacts, dentures or bridgework may not be worn into surgery.  Leave suitcase in the car. After surgery it may be brought to your room.     Patients discharged the day of surgery will not be allowed to drive home.  Name and phone number of your driver: daughter cindy ellington cell [phone]  Special Instructions: N/A              Please read over the following fact sheets you were given: _____________________________________________________________________             [facility] - Preparing for Surgery Before surgery, you can play an important role.  Because skin is not sterile, your skin needs to be as free of germs as possible.  You can reduce the number of germs on your skin by washing with CHG (chlorahexidine gluconate) soap before surgery.  CHG is an antiseptic cleaner which kills germs and bonds with the skin to continue killing germs even after washing. Please DO NOT use if you have an allergy to CHG or antibacterial soaps.  If your  skin becomes reddened/irritated stop using the CHG and inform your nurse when you arrive at Short Stay. Do not shave (including legs and underarms) for at least 48 hours prior to the first CHG shower.  You may shave your face/neck. Please follow these instructions carefully:  1.  Shower with CHG Soap the night before surgery and the  morning of Surgery.  2.  If you choose to wash your hair, wash your hair first as usual with your  normal  shampoo.  3.  After you shampoo, rinse your hair and body thoroughly to remove the  shampoo.                           4.  Use CHG as you would any other liquid soap.  You can apply chg directly  to the skin and wash  Gently with a scrungie or clean washcloth.  5.  Apply the CHG Soap to your body ONLY FROM THE NECK DOWN.   Do not use on face/ open                           Wound or open sores. Avoid contact with eyes, ears mouth and genitals (private parts).                       Wash face,  Genitals (private parts) with your normal soap.             6.  Wash thoroughly, paying special attention to the area where your surgery  will be performed.  7.  Thoroughly rinse your body with warm water from the neck down.  8.  DO NOT shower/wash with your normal soap after using and rinsing off  the CHG Soap.                9.  Pat yourself dry with a clean towel.            10.  Wear clean pajamas.            11.  Place clean sheets on your bed the night of your first shower and do not  sleep with pets. Day of Surgery : Do not apply any lotions/deodorants the morning of surgery.  Please wear clean clothes to the hospital/surgery center.  FAILURE TO FOLLOW THESE INSTRUCTIONS MAY RESULT IN THE CANCELLATION OF YOUR SURGERY PATIENT SIGNATURE_________________________________  NURSE SIGNATURE__________________________________  ________________________________________________________________________

## 2017-09-24 NOTE — Progress Notes (Signed)
bmet results routed to dr gerkin epic inbasket by epic

## 2017-09-24 NOTE — Progress Notes (Signed)
   09/24/17 0948  OBSTRUCTIVE SLEEP APNEA  Have you ever been diagnosed with sleep apnea through a sleep study? No  Do you snore loudly (loud enough to be heard through closed doors)?  1  Do you often feel tired, fatigued, or sleepy during the daytime (such as falling asleep during driving or talking to someone)? 1  Has anyone observed you stop breathing during your sleep? 0  Do you have, or are you being treated for high blood pressure? 1  BMI more than 35 kg/m2? 0  Age > 50 (1-yes) 1  Neck circumference greater than:Male 16 inches or larger, Male 17inches or larger? 0  Male Gender (Yes=1) 1  Obstructive Sleep Apnea Score 5  Score 5 or greater  Results sent to PCP

## 2017-09-24 NOTE — Progress Notes (Addendum)
Received lov dr Donnetta Hutching 06-03-17 cardiology and placed on patient chart, unable to get [facility] records only lov dr Rutherford Guys note

## 2017-09-27 ENCOUNTER — Encounter (HOSPITAL_COMMUNITY): Payer: Self-pay | Admitting: Surgery

## 2017-09-27 DIAGNOSIS — E210 Primary hyperparathyroidism: Secondary | ICD-10-CM | POA: Diagnosis present

## 2017-09-27 HISTORY — DX: Primary hyperparathyroidism: E21.0

## 2017-09-27 NOTE — H&P (Addendum)
General Surgery [facility]  Ryan Mendoza DOB: December 07, 1934 Married / Language: English / Race: White Male   History of Present Illness   The patient is a 83 year old male who presents with primary hyperparathyroidism.  CC: primary hyperparathyroidism  Patient is referred by Dr. Jamal Maes for evaluation of suspected primary hyperparathyroidism. Patient's primary care physician is Dr. Lovette Cliche. Patient was noted on laboratory studies to have an elevated serum calcium level of 10.8. Subsequent laboratory showed an elevated intact PTH level of 84. Patient was being evaluated by nephrology for decreased renal function. Patient complains of chronic fatigue. He has had at least one episode of nephrolithiasis. He denies any bone or joint pain. He denies urinary frequency. Patient does have hypothyroidism and takes levothyroxine 50 g daily. There is no other family history of endocrine disease or endocrine neoplasms. Patient has had no prior surgery on the head or neck. He presents today accompanied by his daughter and his wife for further evaluation and recommendations. Patient has had no imaging studies.   Past Surgical History Cataract Surgery  Right. Coronary Artery Bypass Graft  Knee Surgery  Bilateral. Shoulder Surgery  Bilateral. TURP   Diagnostic Studies History Colonoscopy  1-5 years ago  Allergies No Known Allergies 08/06/2017 Allergies Reconciled   Medication History  Levothyroxine Sodium (50MCG Tablet, Oral) Active. CloNIDine HCl ER (0.1MG  Tablet ER 12HR, Oral) Active. Aspirin (81MG  Tablet DR, Oral) Active. Atorvastatin Calcium (40MG  Tablet, Oral) Active. Plavix (75MG  Tablet, Oral) Active. Nitroglycerin (0.4MG  Tab Sublingual, Sublingual) Active. Zantac (300MG  Tablet, Oral) Active. Norvasc (5MG  Tablet, Oral) Active. Medications Reconciled  Social History Alcohol use  Remotely quit alcohol use. Caffeine use   Coffee. Illicit drug use  Prefer to discuss with provider. Tobacco use  Former smoker.  Family History  Heart Disease  Brother, Father, Mother. Heart disease in male family member before age 24   Other Problems ( Gastric Ulcer  Heart murmur  Hepatitis  Hypercholesterolemia  Inguinal Hernia  Kidney Stone  Thyroid Disease     Review of Systems  General Present- Fatigue. Not Present- Appetite Loss, Chills, Fever, Night Sweats, Weight Gain and Weight Loss. Skin Not Present- Change in Wart/Mole, Dryness, Hives, Jaundice, New Lesions, Non-Healing Wounds, Rash and Ulcer. HEENT Present- Hearing Loss and Ringing in the Ears. Not Present- Earache, Hoarseness, Nose Bleed, Oral Ulcers, Seasonal Allergies, Sinus Pain, Sore Throat, Visual Disturbances, Wears glasses/contact lenses and Yellow Eyes. Respiratory Not Present- Bloody sputum, Chronic Cough, Difficulty Breathing, Snoring and Wheezing. Cardiovascular Present- Leg Cramps and Rapid Heart Rate. Not Present- Chest Pain, Difficulty Breathing Lying Down, Palpitations, Shortness of Breath and Swelling of Extremities. Gastrointestinal Present- Constipation. Not Present- Abdominal Pain, Bloating, Bloody Stool, Change in Bowel Habits, Chronic diarrhea, Difficulty Swallowing, Excessive gas, Gets full quickly at meals, Hemorrhoids, Indigestion, Nausea, Rectal Pain and Vomiting. Male Genitourinary Not Present- Blood in Urine, Change in Urinary Stream, Frequency, Impotence, Nocturia, Painful Urination, Urgency and Urine Leakage. Musculoskeletal Not Present- Back Pain, Joint Pain, Joint Stiffness, Muscle Pain, Muscle Weakness and Swelling of Extremities. Psychiatric Not Present- Anxiety, Bipolar, Change in Sleep Pattern, Depression, Fearful and Frequent crying. Hematology Not Present- Blood Thinners, Easy Bruising, Excessive bleeding, Gland problems, HIV and Persistent Infections.  Vitals Weight: 200 lb Height: 68in Height was reported by  patient. Body Surface Area: 2.04 m Body Mass Index: 30.41 kg/m  Temp.: 97.64F(Temporal)  Pulse: 74 (Regular)  BP: 132/70 (Sitting, Left Arm, Standard)  Physical Exam The physical exam findings are as  follows: Note:CONSTITUTIONAL See vital signs recorded above  GENERAL APPEARANCE Development: normal Nutritional status: normal Gross deformities: none  SKIN Rash, lesions, ulcers: none Induration, erythema: none Nodules: none palpable  EYES Conjunctiva and lids: normal Pupils: equal and reactive Iris: normal bilaterally  EARS, NOSE, MOUTH, THROAT External ears: no lesion or deformity External nose: no lesion or deformity Hearing: grossly normal Lips: no lesion or deformity Dentition: normal for age Oral mucosa: moist  NECK Symmetric: yes Trachea: midline Thyroid: no palpable nodules in the thyroid bed  CHEST Respiratory effort: normal Retraction or accessory muscle use: no Breath sounds: normal bilaterally Rales, rhonchi, wheeze: none  CARDIOVASCULAR Auscultation: regular rhythm, normal rate Murmurs: none Pulses: carotid and radial pulse 2+ palpable Lower extremity edema: none Lower extremity varicosities: none  MUSCULOSKELETAL Station and gait: normal Digits and nails: no clubbing or cyanosis Muscle strength: grossly normal all extremities Range of motion: grossly normal all extremities Deformity: none  LYMPHATIC Cervical: none palpable Supraclavicular: none palpable  PSYCHIATRIC Oriented to person, place, and time: yes Mood and affect: normal for situation Judgment and insight: appropriate for situation    Assessment & Plan  PRIMARY HYPERPARATHYROIDISM (E21.0)  Pt Education - Pamphlet Given - The Parathyroid Surgery Book: discussed with patient and provided information. Follow Up - Call [facility] office after tests / studies doneto discuss further plans  Patient presents on referral from his nephrologist for evaluation of suspected  primary hyperparathyroidism. He is accompanied by his wife and daughter. They are provided with written literature on parathyroid surgery to review at home.  Patient has elevated calcium and intact PTH levels. He has decreased renal function and complaints of chronic fatigue. Suspicion is raised of possible primary hyperparathyroidism.  I am going to obtain a 25-hydroxy vitamin D level. We will also order a 24-hour urine collection for calcium. Patient will be scheduled for a nuclear medicine parathyroid scan in the near future.  We will contact the patient with the results of these studies. Patient may require additional studies such as thyroid ultrasound or 4D CT scan of the neck. We will make that determination following the results of the above studies.  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  Plan minimally invasive out-patient surgery.  The risks and benefits of the procedure have been discussed at length with the patient.  The patient understands the proposed procedure, potential alternative treatments, and the course of recovery to be expected.  All of the patient's questions have been answered at this time.  The patient wishes to proceed with surgery.  Armandina Gemma, [place] [facility] Office: [phone]

## 2017-09-28 ENCOUNTER — Encounter (HOSPITAL_COMMUNITY)
Admission: RE | Disposition: A | Discharge status: Home / Self Care | Payer: Self-pay | Source: Ambulatory Visit | Attending: Surgery

## 2017-09-28 ENCOUNTER — Encounter (HOSPITAL_COMMUNITY): Payer: Self-pay | Admitting: *Deleted

## 2017-09-28 ENCOUNTER — Observation Stay (HOSPITAL_COMMUNITY)
Admission: RE | Admit: 2017-09-28 | Discharge: 2017-09-29 | Disposition: A | Discharge status: Home / Self Care | Payer: Medicare Other | Source: Ambulatory Visit | Attending: Surgery | Admitting: Surgery

## 2017-09-28 ENCOUNTER — Other Ambulatory Visit: Payer: Self-pay

## 2017-09-28 ENCOUNTER — Ambulatory Visit (HOSPITAL_COMMUNITY): Payer: Medicare Other | Admitting: Anesthesiology

## 2017-09-28 DIAGNOSIS — K759 Inflammatory liver disease, unspecified: Secondary | ICD-10-CM | POA: Insufficient documentation

## 2017-09-28 DIAGNOSIS — Z951 Presence of aortocoronary bypass graft: Secondary | ICD-10-CM | POA: Insufficient documentation

## 2017-09-28 DIAGNOSIS — E210 Primary hyperparathyroidism: Secondary | ICD-10-CM | POA: Diagnosis present

## 2017-09-28 DIAGNOSIS — D351 Benign neoplasm of parathyroid gland: Principal | ICD-10-CM | POA: Insufficient documentation

## 2017-09-28 DIAGNOSIS — Z87442 Personal history of urinary calculi: Secondary | ICD-10-CM | POA: Diagnosis not present

## 2017-09-28 DIAGNOSIS — I2510 Atherosclerotic heart disease of native coronary artery without angina pectoris: Secondary | ICD-10-CM | POA: Diagnosis not present

## 2017-09-28 DIAGNOSIS — E7800 Pure hypercholesterolemia, unspecified: Secondary | ICD-10-CM | POA: Insufficient documentation

## 2017-09-28 DIAGNOSIS — Z87891 Personal history of nicotine dependence: Secondary | ICD-10-CM | POA: Diagnosis not present

## 2017-09-28 DIAGNOSIS — Z79899 Other long term (current) drug therapy: Secondary | ICD-10-CM | POA: Insufficient documentation

## 2017-09-28 DIAGNOSIS — Z7982 Long term (current) use of aspirin: Secondary | ICD-10-CM | POA: Insufficient documentation

## 2017-09-28 DIAGNOSIS — R5382 Chronic fatigue, unspecified: Secondary | ICD-10-CM | POA: Insufficient documentation

## 2017-09-28 DIAGNOSIS — Z9841 Cataract extraction status, right eye: Secondary | ICD-10-CM | POA: Diagnosis not present

## 2017-09-28 DIAGNOSIS — K259 Gastric ulcer, unspecified as acute or chronic, without hemorrhage or perforation: Secondary | ICD-10-CM | POA: Insufficient documentation

## 2017-09-28 DIAGNOSIS — E041 Nontoxic single thyroid nodule: Secondary | ICD-10-CM | POA: Insufficient documentation

## 2017-09-28 DIAGNOSIS — I10 Essential (primary) hypertension: Secondary | ICD-10-CM | POA: Diagnosis not present

## 2017-09-28 HISTORY — PX: PARATHYROIDECTOMY: SHX19

## 2017-09-28 SURGERY — PARATHYROIDECTOMY
Anesthesia: General | Site: Neck | Laterality: Right

## 2017-09-28 MED ORDER — AMLODIPINE BESYLATE 5 MG PO TABS
5.0000 mg | ORAL_TABLET | Freq: Every day | ORAL | Status: DC
  Administered 2017-09-28: 5 mg via ORAL
  Filled 2017-09-28: qty 1

## 2017-09-28 MED ORDER — EPHEDRINE SULFATE-NACL 50-0.9 MG/10ML-% IV SOSY
PREFILLED_SYRINGE | INTRAVENOUS | Status: DC | PRN
Start: 2017-09-28 — End: 2017-09-28
  Administered 2017-09-28: 15 mg via INTRAVENOUS
  Administered 2017-09-28: 10 mg via INTRAVENOUS

## 2017-09-28 MED ORDER — LACTATED RINGERS IV SOLN
INTRAVENOUS | Status: DC | PRN
  Administered 2017-09-28 (×2): via INTRAVENOUS

## 2017-09-28 MED ORDER — ROCURONIUM BROMIDE 10 MG/ML (PF) SYRINGE
PREFILLED_SYRINGE | INTRAVENOUS | Status: DC | PRN
  Administered 2017-09-28: 10 mg via INTRAVENOUS
  Administered 2017-09-28: 40 mg via INTRAVENOUS

## 2017-09-28 MED ORDER — ONDANSETRON 4 MG PO TBDP: 4.0000 mg | ORAL_TABLET | Freq: Four times a day (QID) | ORAL | Status: DC | PRN

## 2017-09-28 MED ORDER — FENTANYL CITRATE (PF) 100 MCG/2ML IJ SOLN
INTRAMUSCULAR | Status: DC | PRN
  Administered 2017-09-28 (×2): 50 ug via INTRAVENOUS

## 2017-09-28 MED ORDER — CHLORHEXIDINE GLUCONATE CLOTH 2 % EX PADS: 6.0000 | MEDICATED_PAD | Freq: Once | CUTANEOUS | Status: DC

## 2017-09-28 MED ORDER — LIDOCAINE 2% (20 MG/ML) 5 ML SYRINGE
INTRAMUSCULAR | Status: AC
  Filled 2017-09-28: qty 5

## 2017-09-28 MED ORDER — GLYCOPYRROLATE 0.2 MG/ML IV SOSY
PREFILLED_SYRINGE | INTRAVENOUS | Status: AC
  Filled 2017-09-28: qty 5

## 2017-09-28 MED ORDER — PROPOFOL 10 MG/ML IV BOLUS
INTRAVENOUS | Status: AC
Start: 2017-09-28 — End: ?
  Filled 2017-09-28: qty 20

## 2017-09-28 MED ORDER — BUPIVACAINE HCL (PF) 0.25 % IJ SOLN
INTRAMUSCULAR | Status: AC
  Filled 2017-09-28: qty 30

## 2017-09-28 MED ORDER — SUGAMMADEX SODIUM 200 MG/2ML IV SOLN
INTRAVENOUS | Status: DC | PRN
  Administered 2017-09-28: 200 mg via INTRAVENOUS

## 2017-09-28 MED ORDER — SODIUM CHLORIDE 0.9 % IR SOLN
Status: DC | PRN
  Administered 2017-09-28: 1000 mL

## 2017-09-28 MED ORDER — ONDANSETRON HCL 4 MG/2ML IJ SOLN: 4.0000 mg | Freq: Once | INTRAMUSCULAR | Status: DC | PRN

## 2017-09-28 MED ORDER — FENTANYL CITRATE (PF) 100 MCG/2ML IJ SOLN
INTRAMUSCULAR | Status: AC
  Filled 2017-09-28: qty 2

## 2017-09-28 MED ORDER — ONDANSETRON HCL 4 MG/2ML IJ SOLN
INTRAMUSCULAR | Status: AC
  Filled 2017-09-28: qty 2

## 2017-09-28 MED ORDER — HYDROMORPHONE HCL-NACL 0.5-0.9 MG/ML-% IV SOSY
PREFILLED_SYRINGE | INTRAVENOUS | Status: AC
  Filled 2017-09-28: qty 1

## 2017-09-28 MED ORDER — ONDANSETRON HCL 4 MG/2ML IJ SOLN: 4.0000 mg | Freq: Four times a day (QID) | INTRAMUSCULAR | Status: DC | PRN

## 2017-09-28 MED ORDER — DEXAMETHASONE SODIUM PHOSPHATE 10 MG/ML IJ SOLN
INTRAMUSCULAR | Status: DC | PRN
  Administered 2017-09-28: 10 mg via INTRAVENOUS

## 2017-09-28 MED ORDER — CEFAZOLIN SODIUM-DEXTROSE 2-4 GM/100ML-% IV SOLN
INTRAVENOUS | Status: AC
  Filled 2017-09-28: qty 100

## 2017-09-28 MED ORDER — ACETAMINOPHEN 500 MG PO TABS
1000.0000 mg | ORAL_TABLET | Freq: Four times a day (QID) | ORAL | Status: DC
  Administered 2017-09-28 – 2017-09-29 (×4): 1000 mg via ORAL
  Filled 2017-09-28 (×3): qty 2

## 2017-09-28 MED ORDER — HYDROMORPHONE HCL-NACL 0.5-0.9 MG/ML-% IV SOSY
0.2500 mg | PREFILLED_SYRINGE | INTRAVENOUS | Status: DC | PRN
  Administered 2017-09-28 (×6): 0.25 mg via INTRAVENOUS

## 2017-09-28 MED ORDER — ONDANSETRON HCL 4 MG/2ML IJ SOLN
INTRAMUSCULAR | Status: DC | PRN
  Administered 2017-09-28: 4 mg via INTRAVENOUS

## 2017-09-28 MED ORDER — NITROGLYCERIN 0.4 MG SL SUBL: 0.4000 mg | SUBLINGUAL_TABLET | SUBLINGUAL | Status: DC | PRN

## 2017-09-28 MED ORDER — LIDOCAINE 2% (20 MG/ML) 5 ML SYRINGE
INTRAMUSCULAR | Status: DC | PRN
  Administered 2017-09-28: 100 mg via INTRAVENOUS

## 2017-09-28 MED ORDER — DEXAMETHASONE SODIUM PHOSPHATE 10 MG/ML IJ SOLN
INTRAMUSCULAR | Status: AC
  Filled 2017-09-28: qty 1

## 2017-09-28 MED ORDER — POLYVINYL ALCOHOL 1.4 % OP SOLN
1.0000 [drp] | Freq: Every day | OPHTHALMIC | Status: DC
  Filled 2017-09-28: qty 15

## 2017-09-28 MED ORDER — CEFAZOLIN SODIUM-DEXTROSE 2-4 GM/100ML-% IV SOLN
2.0000 g | INTRAVENOUS | Status: AC
  Administered 2017-09-28: 2 g via INTRAVENOUS

## 2017-09-28 MED ORDER — KCL IN DEXTROSE-NACL 20-5-0.45 MEQ/L-%-% IV SOLN
INTRAVENOUS | Status: DC
  Administered 2017-09-28 (×2): via INTRAVENOUS
  Filled 2017-09-28 (×2): qty 1000

## 2017-09-28 MED ORDER — FAMOTIDINE 20 MG PO TABS
20.0000 mg | ORAL_TABLET | Freq: Every day | ORAL | Status: DC
  Administered 2017-09-28 – 2017-09-29 (×2): 20 mg via ORAL
  Filled 2017-09-28 (×2): qty 1

## 2017-09-28 MED ORDER — TRAZODONE HCL 50 MG PO TABS
50.0000 mg | ORAL_TABLET | Freq: Every evening | ORAL | Status: DC | PRN
  Administered 2017-09-28: 50 mg via ORAL
  Filled 2017-09-28: qty 1

## 2017-09-28 MED ORDER — HYDROCODONE-ACETAMINOPHEN 5-325 MG PO TABS
1.0000 | ORAL_TABLET | ORAL | Status: DC | PRN
  Administered 2017-09-28: 1 via ORAL
  Filled 2017-09-28: qty 1

## 2017-09-28 MED ORDER — LEVOTHYROXINE SODIUM 50 MCG PO TABS
50.0000 ug | ORAL_TABLET | Freq: Every day | ORAL | Status: DC
  Administered 2017-09-28: 50 ug via ORAL
  Filled 2017-09-28: qty 1

## 2017-09-28 MED ORDER — ROCURONIUM BROMIDE 50 MG/5ML IV SOSY
PREFILLED_SYRINGE | INTRAVENOUS | Status: AC
  Filled 2017-09-28: qty 5

## 2017-09-28 MED ORDER — CLONIDINE HCL 0.1 MG PO TABS
0.1000 mg | ORAL_TABLET | Freq: Two times a day (BID) | ORAL | Status: DC
  Administered 2017-09-28 – 2017-09-29 (×2): 0.1 mg via ORAL
  Filled 2017-09-28 (×2): qty 1

## 2017-09-28 MED ORDER — GLYCOPYRROLATE 0.2 MG/ML IJ SOLN
INTRAMUSCULAR | Status: DC | PRN
  Administered 2017-09-28: 0.2 mg via INTRAVENOUS

## 2017-09-28 MED ORDER — SUCCINYLCHOLINE CHLORIDE 200 MG/10ML IV SOSY
PREFILLED_SYRINGE | INTRAVENOUS | Status: AC
  Filled 2017-09-28: qty 10

## 2017-09-28 MED ORDER — HYDROMORPHONE HCL 1 MG/ML IJ SOLN: 1.0000 mg | INTRAMUSCULAR | Status: DC | PRN

## 2017-09-28 MED ORDER — SUCCINYLCHOLINE CHLORIDE 200 MG/10ML IV SOSY
PREFILLED_SYRINGE | INTRAVENOUS | Status: DC | PRN
  Administered 2017-09-28: 100 mg via INTRAVENOUS

## 2017-09-28 MED ORDER — MEPERIDINE HCL 50 MG/ML IJ SOLN: 6.2500 mg | INTRAMUSCULAR | Status: DC | PRN

## 2017-09-28 MED ORDER — PROPOFOL 10 MG/ML IV BOLUS
INTRAVENOUS | Status: DC | PRN
  Administered 2017-09-28: 120 mg via INTRAVENOUS

## 2017-09-28 MED ORDER — HYDROMORPHONE HCL-NACL 0.5-0.9 MG/ML-% IV SOSY
PREFILLED_SYRINGE | INTRAVENOUS | Status: AC
  Filled 2017-09-28: qty 2

## 2017-09-28 SURGICAL SUPPLY — 43 items
ATTRACTOMAT 16X20 MAGNETIC DRP (DRAPES) ×3 IMPLANT
BENZOIN TINCTURE PRP APPL 2/3 (GAUZE/BANDAGES/DRESSINGS) ×3 IMPLANT
BLADE HEX COATED 2.75 (ELECTRODE) ×3 IMPLANT
BLADE SURG 15 STRL LF DISP TIS (BLADE) ×1 IMPLANT
BLADE SURG 15 STRL SS (BLADE) ×2
CHLORAPREP W/TINT 26ML (MISCELLANEOUS) ×6 IMPLANT
CLIP VESOCCLUDE MED 6/CT (CLIP) ×3 IMPLANT
CLIP VESOCCLUDE SM WIDE 6/CT (CLIP) ×6 IMPLANT
CLOSURE WOUND 1/2 X4 (GAUZE/BANDAGES/DRESSINGS) ×1
CONT SPEC 4OZ CLIKSEAL STRL BL (MISCELLANEOUS) ×3 IMPLANT
DERMABOND ADVANCED (GAUZE/BANDAGES/DRESSINGS)
DERMABOND ADVANCED .7 DNX12 (GAUZE/BANDAGES/DRESSINGS) IMPLANT
DRAPE LAPAROTOMY T 98X78 PEDS (DRAPES) ×3 IMPLANT
ELECT PENCIL ROCKER SW 15FT (MISCELLANEOUS) ×3 IMPLANT
ELECT REM PT RETURN 15FT ADLT (MISCELLANEOUS) ×3 IMPLANT
GAUZE SPONGE 4X4 12PLY STRL (GAUZE/BANDAGES/DRESSINGS) ×3 IMPLANT
GAUZE SPONGE 4X4 16PLY XRAY LF (GAUZE/BANDAGES/DRESSINGS) ×3 IMPLANT
GLOVE BIO SURGEON STRL SZ7 (GLOVE) ×6 IMPLANT
GLOVE BIOGEL PI IND STRL 7.5 (GLOVE) ×2 IMPLANT
GLOVE BIOGEL PI INDICATOR 7.5 (GLOVE) ×4
GLOVE SURG ORTHO 8.0 STRL STRW (GLOVE) ×3 IMPLANT
GOWN STRL REUS W/TWL XL LVL3 (GOWN DISPOSABLE) ×9 IMPLANT
HEMOSTAT SURGICEL 2X4 FIBR (HEMOSTASIS) ×3 IMPLANT
ILLUMINATOR WAVEGUIDE N/F (MISCELLANEOUS) ×3 IMPLANT
KIT BASIN OR (CUSTOM PROCEDURE TRAY) ×3 IMPLANT
LIGHT WAVEGUIDE WIDE FLAT (MISCELLANEOUS) IMPLANT
NEEDLE HYPO 25X1 1.5 SAFETY (NEEDLE) ×3 IMPLANT
PACK BASIC VI WITH GOWN DISP (CUSTOM PROCEDURE TRAY) ×3 IMPLANT
POWDER SURGICEL 3.0 GRAM (HEMOSTASIS) IMPLANT
STAPLER VISISTAT 35W (STAPLE) ×3 IMPLANT
STRIP CLOSURE SKIN 1/2X4 (GAUZE/BANDAGES/DRESSINGS) ×2 IMPLANT
SUT MNCRL AB 4-0 PS2 18 (SUTURE) ×3 IMPLANT
SUT SILK 2 0 (SUTURE)
SUT SILK 2-0 18XBRD TIE 12 (SUTURE) IMPLANT
SUT SILK 3 0 (SUTURE)
SUT SILK 3-0 18XBRD TIE 12 (SUTURE) IMPLANT
SUT VIC AB 3-0 SH 18 (SUTURE) ×6 IMPLANT
SYR BULB IRRIGATION 50ML (SYRINGE) ×3 IMPLANT
SYR CONTROL 10ML LL (SYRINGE) ×3 IMPLANT
TAPE CLOTH SURG 4X10 WHT LF (GAUZE/BANDAGES/DRESSINGS) ×3 IMPLANT
TOWEL OR 17X26 10 PK STRL BLUE (TOWEL DISPOSABLE) ×3 IMPLANT
TOWEL OR NON WOVEN STRL DISP B (DISPOSABLE) ×3 IMPLANT
YANKAUER SUCT BULB TIP 10FT TU (MISCELLANEOUS) ×3 IMPLANT

## 2017-09-28 NOTE — Interval H&P Note (Signed)
History and Physical Interval Note:  09/28/2017 7:16 AM  Ryan Mendoza  has presented today for surgery, with the diagnosis of PRIMARY HYPERPARATHYROIDISM   The various methods of treatment have been discussed with the patient and family. After consideration of risks, benefits and other options for treatment, the patient has consented to    Procedure(s): RIGHT INFERIOR PARATHYROIDECTOMY (Right) as a surgical intervention .    The patient's history has been reviewed, patient examined, no change in status, stable for surgery.  I have reviewed the patient's chart and labs.  Questions were answered to the patient's satisfaction.    Armandina Gemma, [place] [facility] Office: [place]

## 2017-09-28 NOTE — Op Note (Signed)
NAME:  Ryan Mendoza, Ryan Mendoza NO.:  MEDICAL RECORD NO.:  [id number]  LOCATION:                                 FACILITY:  PHYSICIAN:  Earnstine Regal, MD           DATE OF BIRTH:  DATE OF PROCEDURE:  09/28/2017                              OPERATIVE REPORT   PREOPERATIVE DIAGNOSIS:  Primary hyperparathyroidism.  POSTOPERATIVE DIAGNOSIS:  Primary hyperparathyroidism.  PROCEDURE:  1. Neck exploration  2. Right superior parathyroidectomy  3. Right inferior parathyroidectomy  SURGEON:  Earnstine Regal, MD, FACS  ANESTHESIA:  General.  ESTIMATED BLOOD LOSS:  Minimal.  PREPARATION:  ChloraPrep.  COMPLICATIONS:  None.  INDICATIONS:  The patient is an 83 year old male referred by Dr. Jamal Maes for suspected primary hyperparathyroidism.  The patient had laboratory studies showing an elevated serum calcium level of 10.8. Intact PTH level was elevated at 84.  The patient had decreased renal function.  He complains of chronic fatigue.  He has had 1 episode of nephrolithiasis.  The patient underwent nuclear medicine parathyroid scanning, which localized a parathyroid adenoma to the right inferior position.  The patient now comes to Surgery for parathyroidectomy.  BODY OF REPORT:  Procedure was done in OR #4 at the [facility].  The patient was brought to the operating room, placed in a supine position on the operating room table.  Following administration of general anesthesia, the patient was positioned and then prepped and draped in the usual aseptic fashion.  After ascertaining that an adequate level of anesthesia had been achieved, a right inferior neck incision was made with a #15 blade.  Dissection was carried through subcutaneous tissues and platysma.  Hemostasis was achieved with the electrocautery.  Skin flaps were elevated circumferentially and a Weitlaner retractor placed for exposure.  Strap muscles were incised in the midline and  reflected laterally exposing the right thyroid lobe.  Right lobe was gently mobilized.  There was a nodular mass at the inferior pole of the right thyroid lobe, which is somewhat separate from the thyroid parenchyma.  This has the appearance of an enlarged parathyroid gland.  It was gently dissected out. However, it is somewhat intimately oppose to the thyroid gland.  It was separated using the electrocautery for hemostasis.  The entire mass was excised and submitted to Pathology.  Frozen section biopsy confirmed benign thyroid nodule.  Further dissection in the right neck fails to reveal an enlarged parathyroid gland in the right inferior position.  Sestamibi scan was again reviewed.  Decision was made to extend the incision and do a 4- gland exploration.  Incision was extended across the midline.  Skin flaps were elevated cephalad and caudad.  A [place] self-retaining retractor was placed for exposure.  Strap muscles were incised fully in the midline.  Strap muscles were reflected to the right and the right thyroid lobe was fully mobilized and rolled anteriorly.  Exploration posteriorly reveals the inferior thyroid artery.  There appears to be a normal, but slightly enlarged parathyroid gland in the inferior position.  Further dissection just above the artery reveals an enlarged parathyroid  gland measuring approximately 1.2 cm in greatest dimension.  It was gently dissected out.  Vascular structures are divided between small Ligaclips and the gland was excised.  It was submitted to pathology labeled as right parathyroid adenoma.  Frozen section biopsy confirmed parathyroid adenoma.  The inferior parathyroid on the right had also been submitted for frozen section and confirmed normal parathyroid tissue.  Dry pack was placed in the right neck.  Strap muscles on the left were reflected laterally.  Left lobe was exposed.  At the inferior pole of the left lobe of the thyroid, it was  a normal parathyroid gland.  This was left in situ.  Further exploration reveals the inferior thyroid artery.  There appears to be a small, but normal parathyroid gland just posterior to the inferior thyroid artery. This was also left in situ.  Neck was irrigated with warm saline and good hemostasis was noted throughout the operative field.  Fibrillar was placed throughout the operative field.  Strap muscles were reapproximated in the midline with interrupted 3-0 Vicryl sutures.  Platysma was closed with interrupted 3- 0 Vicryl sutures.  Skin was closed with a running 4-0 Monocryl subcuticular suture.  Wound was washed and dried and Steri-Strips were applied.  Sterile dressings were applied.  The patient was awakened from anesthesia and brought to the recovery room.  The patient tolerated the procedure well.   Armandina Gemma, [place] [facility] Office: [phone]    TMG/MEDQ  D:  09/28/2017  T:  09/28/2017  Job:  [id number]  cc:   Elzie Rings. Lorrene Reid, M.D. Fax: [phone]  Lovette Cliche II, M.D. Fax: [place], [place] [phone] [address] [place] [address]

## 2017-09-28 NOTE — Transfer of Care (Signed)
Immediate Anesthesia Transfer of Care Note  Patient: Ryan Mendoza  Procedure(s) Performed: RIGHT INFERIOR PARATHYROIDECTOMY (Right Neck)  Patient Location: PACU  Anesthesia Type:General  Level of Consciousness: sedated  Airway & Oxygen Therapy: Patient Spontanous Breathing and Patient connected to face mask oxygen  Post-op Assessment: Report given to RN and Post -op Vital signs reviewed and stable  Post vital signs: Reviewed and stable  Last Vitals:  Vitals:   09/28/17 0548  BP: (!) 154/76  Pulse: (!) 48  Resp: 18  Temp: 36.7 C  SpO2: 99%    Last Pain:  Vitals:   09/28/17 0548  TempSrc: Oral         Complications: No apparent anesthesia complications

## 2017-09-28 NOTE — Anesthesia Procedure Notes (Signed)
Procedure Name: Intubation Date/Time: 09/28/2017 7:30 AM Performed by: Lind Covert Pre-anesthesia Checklist: Patient identified, Emergency Drugs available, Suction available, Patient being monitored and Timeout performed Patient Re-evaluated:Patient Re-evaluated prior to induction Oxygen Delivery Method: Circle system utilized Preoxygenation: Pre-oxygenation with 100% oxygen Induction Type: IV induction Laryngoscope Size: Mac and 4 Grade View: Grade I Tube type: Oral Tube size: 7.5 mm Number of attempts: 1 Airway Equipment and Method: Stylet Placement Confirmation: positive ETCO2,  ETT inserted through vocal cords under direct vision and breath sounds checked- equal and bilateral Secured at: 22 cm Tube secured with: Tape Dental Injury: Teeth and Oropharynx as per pre-operative assessment

## 2017-09-28 NOTE — Brief Op Note (Signed)
09/28/2017  9:39 AM  PATIENT:  Ryan Mendoza  83 y.o. male  PRE-OPERATIVE DIAGNOSIS:  PRIMARY HYPERPARATHYROIDISM   POST-OPERATIVE DIAGNOSIS:  PRIMARY HYPERPARATHYROIDISM   PROCEDURE:  Neck exploration with right superior parathyroidectomy, right inferior parathyroidectomy  SURGEON:  Surgeon(s) and Role:    Armandina Gemma, MD - Primary  ANESTHESIA:   general  EBL:  Total I/O In: 1000 [I.V.:1000] Out: 10 [Blood:10]  BLOOD ADMINISTERED:none  DRAINS: none   LOCAL MEDICATIONS USED:  NONE  SPECIMEN:  Excision  DISPOSITION OF SPECIMEN:  PATHOLOGY  COUNTS:  YES  TOURNIQUET:  * No tourniquets in log *  DICTATION: .Other Dictation: Dictation Number [id number]  PLAN OF CARE: Admit for overnight observation  PATIENT DISPOSITION:  PACU - hemodynamically stable.   Delay start of Pharmacological VTE agent (>24hrs) due to surgical blood loss or risk of bleeding: yes  Armandina Gemma, MD [facility] Office: [phone]

## 2017-09-28 NOTE — Anesthesia Postprocedure Evaluation (Signed)
Anesthesia Post Note  Patient: MEKHI SONN  Procedure(s) Performed: RIGHT INFERIOR PARATHYROIDECTOMY (Right Neck)     Patient location during evaluation: PACU Anesthesia Type: General Level of consciousness: awake and alert Pain management: pain level controlled Vital Signs Assessment: post-procedure vital signs reviewed and stable Respiratory status: spontaneous breathing, nonlabored ventilation, respiratory function stable and patient connected to nasal cannula oxygen Cardiovascular status: blood pressure returned to baseline and stable Postop Assessment: no apparent nausea or vomiting Anesthetic complications: no    Last Vitals:  Vitals:   09/28/17 1156 09/28/17 1304  BP: (!) 160/75 140/69  Pulse: 88 85  Resp: 16 16  Temp: 36.7 C 36.4 C  SpO2: 96% 94%    Last Pain:  Vitals:   09/28/17 1304  TempSrc: Oral  PainSc:                  Bao Bazen DAVID

## 2017-09-28 NOTE — Anesthesia Preprocedure Evaluation (Signed)
Anesthesia Evaluation  Patient identified by MRN, date of birth, ID band Patient awake    Reviewed: Allergy & Precautions, NPO status , Patient's Chart, lab work & pertinent test results  Airway Mallampati: I  TM Distance: >3 FB Neck ROM: Full    Dental   Pulmonary former smoker,    Pulmonary exam normal        Cardiovascular hypertension, Pt. on medications + CAD  Normal cardiovascular exam     Neuro/Psych Anxiety Depression    GI/Hepatic GERD  Medicated and Controlled,  Endo/Other    Renal/GU      Musculoskeletal   Abdominal   Peds  Hematology   Anesthesia Other Findings   Reproductive/Obstetrics                             Anesthesia Physical Anesthesia Plan  ASA: II  Anesthesia Plan: General   Post-op Pain Management:    Induction: Intravenous  PONV Risk Score and Plan: 2 and Ondansetron and Dexamethasone  Airway Management Planned: Oral ETT  Additional Equipment:   Intra-op Plan:   Post-operative Plan: Extubation in OR  Informed Consent: I have reviewed the patients History and Physical, chart, labs and discussed the procedure including the risks, benefits and alternatives for the proposed anesthesia with the patient or authorized representative who has indicated his/her understanding and acceptance.     Plan Discussed with: CRNA and Surgeon  Anesthesia Plan Comments:         Anesthesia Quick Evaluation

## 2017-09-29 ENCOUNTER — Encounter (HOSPITAL_COMMUNITY): Payer: Self-pay | Admitting: Surgery

## 2017-09-29 DIAGNOSIS — Z87891 Personal history of nicotine dependence: Secondary | ICD-10-CM | POA: Diagnosis not present

## 2017-09-29 DIAGNOSIS — R5382 Chronic fatigue, unspecified: Secondary | ICD-10-CM | POA: Diagnosis not present

## 2017-09-29 DIAGNOSIS — Z87442 Personal history of urinary calculi: Secondary | ICD-10-CM | POA: Diagnosis not present

## 2017-09-29 DIAGNOSIS — Z7982 Long term (current) use of aspirin: Secondary | ICD-10-CM | POA: Diagnosis not present

## 2017-09-29 DIAGNOSIS — D351 Benign neoplasm of parathyroid gland: Secondary | ICD-10-CM | POA: Diagnosis not present

## 2017-09-29 DIAGNOSIS — K259 Gastric ulcer, unspecified as acute or chronic, without hemorrhage or perforation: Secondary | ICD-10-CM | POA: Diagnosis not present

## 2017-09-29 DIAGNOSIS — E210 Primary hyperparathyroidism: Secondary | ICD-10-CM | POA: Diagnosis not present

## 2017-09-29 DIAGNOSIS — K759 Inflammatory liver disease, unspecified: Secondary | ICD-10-CM | POA: Diagnosis not present

## 2017-09-29 DIAGNOSIS — Z79899 Other long term (current) drug therapy: Secondary | ICD-10-CM | POA: Diagnosis not present

## 2017-09-29 DIAGNOSIS — Z951 Presence of aortocoronary bypass graft: Secondary | ICD-10-CM | POA: Diagnosis not present

## 2017-09-29 DIAGNOSIS — E041 Nontoxic single thyroid nodule: Secondary | ICD-10-CM | POA: Diagnosis not present

## 2017-09-29 DIAGNOSIS — Z9841 Cataract extraction status, right eye: Secondary | ICD-10-CM | POA: Diagnosis not present

## 2017-09-29 DIAGNOSIS — E7800 Pure hypercholesterolemia, unspecified: Secondary | ICD-10-CM | POA: Diagnosis not present

## 2017-09-29 LAB — BASIC METABOLIC PANEL
Anion gap: 8 (ref 5–15)
BUN: 24 mg/dL — ABNORMAL HIGH (ref 6–20)
CALCIUM: 9 mg/dL (ref 8.9–10.3)
CO2: 25 mmol/L (ref 22–32)
CREATININE: 1.56 mg/dL — AB (ref 0.61–1.24)
Chloride: 104 mmol/L (ref 101–111)
GFR calc non Af Amer: 40 mL/min — ABNORMAL LOW (ref >60)
GFR, EST AFRICAN AMERICAN: 47 mL/min — AB (ref >60)
GLUCOSE: 160 mg/dL — AB (ref 65–99)
Potassium: 4.6 mmol/L (ref 3.5–5.1)
Sodium: 137 mmol/L (ref 135–145)

## 2017-09-29 MED ORDER — HYDROCODONE-ACETAMINOPHEN 5-325 MG PO TABS: 1.0000 | ORAL_TABLET | ORAL | 0 refills | Status: DC | PRN

## 2017-09-29 NOTE — Care Management Obs Status (Signed)
[place] NOTIFICATION   Patient Details  Name: Ryan Mendoza MRN: [id number] Date of Birth: 01-20-1934   Medicare Observation Status Notification Given:  Yes    Guadalupe Maple, RN 09/29/2017, 9:29 AM

## 2017-09-29 NOTE — Progress Notes (Signed)
Discharge instructions reviewed with patient and wife. All questions answered. Patient escorted to vehicle with belongings with nurse tech.

## 2017-09-29 NOTE — Discharge Summary (Signed)
Physician Discharge Summary [facility]  Patient ID: Ryan Mendoza MRN: [id number] DOB/AGE: [id number] 81 y.o.  Admit date: 09/28/2017 Discharge date: 09/29/2017  Admission Diagnoses:  Primary hyperparathyroidism  Discharge Diagnoses:  Principal Problem:   Hyperparathyroidism, primary [facility])   Discharged Condition: good  Hospital Course: Patient was admitted for observation following parathyroid surgery.  Post op course was uncomplicated.  Pain was well controlled.  Tolerated diet.  Post op calcium level on morning following surgery was 9.0 mg/dl.  Patient was prepared for discharge home on POD#1.  Consults: None  Treatments: surgery: neck exploration with parathyroidectomy  Discharge Exam: Blood pressure (!) 152/78, pulse 63, temperature 97.8 F (36.6 C), temperature source Oral, resp. rate 18, SpO2 96 %. HEENT - clear Neck - wound dry and intact; minimal STS; voice normal Chest - clear bilaterally Cor - RRR   Disposition: Home  Discharge Instructions    Diet - low sodium heart healthy    Complete by:  As directed    Discharge instructions    Complete by:  As directed    [facility]  THYROID & PARATHYROID SURGERY:  POST-OP INSTRUCTIONS  Always review your discharge instruction sheet from the facility where your surgery was performed.  A prescription for pain medication may be given to you upon discharge.  Take your pain medication as prescribed.  If narcotic pain medicine is not needed, then you may take acetaminophen (Tylenol) or ibuprofen (Advil) as needed.  Take your usually prescribed medications unless otherwise directed.  If you need a refill on your pain medication, please contact our office during regular business hours.  Prescriptions will not be processed by our office after 5 pm or on weekends.  Start with a light diet upon arrival home, such as soup and crackers or toast.  Be sure to drink plenty of fluids daily.   Resume your normal diet the day after surgery.  Most patients will experience some swelling and bruising on the chest and neck area.  Ice packs will help.  Swelling and bruising can take several days to resolve.   It is common to experience some constipation after surgery.  Increasing fluid intake and taking a stool softener (Colace) will usually help or prevent this problem.  A mild laxative (Milk of Magnesia or Miralax) should be taken according to package directions if there has been no bowel movement after 48 hours.  You have steri-strips and a gauze dressing over your incision.  You may remove the gauze bandage on the second day after surgery, and you may shower at that time.  Leave your steri-strips (small skin tapes) in place directly over the incision.  These strips should remain on the skin for 5-7 days and then be removed.  You may get them wet in the shower and pat them dry.  You may resume regular (light) daily activities beginning the next day - such as daily self-care, walking, climbing stairs - gradually increasing activities as tolerated.  You may have sexual intercourse when it is comfortable.  Refrain from any heavy lifting or straining until approved by your doctor.  You may drive when you no longer are taking prescription pain medication, you can comfortably wear a seatbelt, and you can safely maneuver your car and apply brakes.  You should see your doctor in the office for a follow-up appointment approximately three weeks after your surgery.  Make sure that you call for this appointment within a day or two after you  arrive home to insure a convenient appointment time.  WHEN TO CALL YOUR DOCTOR: -- Fever greater than 101.5 -- Inability to urinate -- Nausea and/or vomiting - persistent -- Extreme swelling or bruising -- Continued bleeding from incision -- Increased pain, redness, or drainage from the incision -- Difficulty swallowing or breathing -- Muscle cramping or  spasms -- Numbness or tingling in hands or around lips  The clinic staff is available to answer your questions during regular business hours.  Please don't hesitate to call and ask to speak to one of the nurses if you have concerns.  Earnstine Regal, MD, [facility] Office: [phone]  Website: [email or web address]   Ice pack    Complete by:  As directed    Increase activity slowly    Complete by:  As directed    Remove dressing in 24 hours    Complete by:  As directed      Allergies as of 09/29/2017   No Known Allergies     Medication List    TAKE these medications   aluminum-magnesium hydroxide-simethicone 200-200-20 MG/5ML Susp Commonly known as:  MAALOX Take 30 mLs by mouth at bedtime.   amLODipine 5 MG tablet Commonly known as:  NORVASC Take 5 mg by mouth at bedtime.   aspirin EC 81 MG tablet Take 81 mg by mouth at bedtime.   atorvastatin 40 MG tablet Commonly known as:  LIPITOR Take 40 mg by mouth daily.   cloNIDine 0.1 MG tablet Commonly known as:  CATAPRES Take 0.1 mg by mouth 2 (two) times daily with a meal.   clopidogrel 75 MG tablet Commonly known as:  PLAVIX Take 75 mg by mouth at bedtime.   HYDROcodone-acetaminophen 5-325 MG tablet Commonly known as:  NORCO/VICODIN Take 1-2 tablets by mouth every 4 (four) hours as needed for moderate pain.   levothyroxine 50 MCG tablet Commonly known as:  SYNTHROID, LEVOTHROID Take 50 mcg by mouth at bedtime.   LUBRICANT EYE DROPS OP Place 2-4 drops into both eyes daily.   nitroGLYCERIN 0.4 MG SL tablet Commonly known as:  NITROSTAT Place 0.4 mg under the tongue every 5 (five) minutes as needed for chest pain.   ranitidine 300 MG tablet Commonly known as:  ZANTAC Take 300 mg by mouth at bedtime.   traZODone 50 MG tablet Commonly known as:  DESYREL Take 50 mg by mouth at bedtime as needed for sleep.      Follow-up Information     Armandina Gemma, MD. Schedule an appointment as soon as possible for a visit in 3 week(s).   Specialty:  General Surgery Contact information: [address] [place] [address] [phone]           Earnstine Regal, MD, [facility] Office: [phone]   Signed: Earnstine Regal 09/29/2017, 7:57 AM

## 2017-10-06 DIAGNOSIS — L570 Actinic keratosis: Secondary | ICD-10-CM | POA: Diagnosis not present

## 2017-10-06 DIAGNOSIS — D1801 Hemangioma of skin and subcutaneous tissue: Secondary | ICD-10-CM | POA: Diagnosis not present

## 2017-10-06 DIAGNOSIS — L821 Other seborrheic keratosis: Secondary | ICD-10-CM | POA: Diagnosis not present

## 2017-10-06 DIAGNOSIS — L820 Inflamed seborrheic keratosis: Secondary | ICD-10-CM | POA: Diagnosis not present

## 2017-10-06 DIAGNOSIS — D485 Neoplasm of uncertain behavior of skin: Secondary | ICD-10-CM | POA: Diagnosis not present

## 2017-10-12 DIAGNOSIS — E210 Primary hyperparathyroidism: Secondary | ICD-10-CM | POA: Diagnosis not present

## 2017-10-30 DIAGNOSIS — R079 Chest pain, unspecified: Secondary | ICD-10-CM | POA: Diagnosis not present

## 2017-10-30 DIAGNOSIS — Z23 Encounter for immunization: Secondary | ICD-10-CM | POA: Diagnosis not present

## 2017-10-30 DIAGNOSIS — K219 Gastro-esophageal reflux disease without esophagitis: Secondary | ICD-10-CM | POA: Diagnosis not present

## 2017-10-30 DIAGNOSIS — E039 Hypothyroidism, unspecified: Secondary | ICD-10-CM | POA: Diagnosis not present

## 2017-10-30 DIAGNOSIS — Z7982 Long term (current) use of aspirin: Secondary | ICD-10-CM | POA: Diagnosis not present

## 2017-10-30 DIAGNOSIS — I952 Hypotension due to drugs: Secondary | ICD-10-CM | POA: Diagnosis not present

## 2017-10-30 DIAGNOSIS — E785 Hyperlipidemia, unspecified: Secondary | ICD-10-CM | POA: Diagnosis not present

## 2017-10-30 DIAGNOSIS — Z7902 Long term (current) use of antithrombotics/antiplatelets: Secondary | ICD-10-CM | POA: Diagnosis not present

## 2017-10-30 DIAGNOSIS — R0602 Shortness of breath: Secondary | ICD-10-CM | POA: Diagnosis not present

## 2017-10-30 DIAGNOSIS — I200 Unstable angina: Secondary | ICD-10-CM | POA: Diagnosis not present

## 2017-10-30 DIAGNOSIS — N183 Chronic kidney disease, stage 3 (moderate): Secondary | ICD-10-CM | POA: Diagnosis not present

## 2017-10-30 DIAGNOSIS — I129 Hypertensive chronic kidney disease with stage 1 through stage 4 chronic kidney disease, or unspecified chronic kidney disease: Secondary | ICD-10-CM | POA: Diagnosis not present

## 2017-10-30 DIAGNOSIS — Z955 Presence of coronary angioplasty implant and graft: Secondary | ICD-10-CM | POA: Diagnosis not present

## 2017-10-30 DIAGNOSIS — E782 Mixed hyperlipidemia: Secondary | ICD-10-CM | POA: Diagnosis not present

## 2017-10-30 DIAGNOSIS — I959 Hypotension, unspecified: Secondary | ICD-10-CM | POA: Diagnosis not present

## 2017-10-30 DIAGNOSIS — R001 Bradycardia, unspecified: Secondary | ICD-10-CM | POA: Diagnosis not present

## 2017-10-30 DIAGNOSIS — R202 Paresthesia of skin: Secondary | ICD-10-CM | POA: Diagnosis not present

## 2017-10-30 DIAGNOSIS — I25110 Atherosclerotic heart disease of native coronary artery with unstable angina pectoris: Secondary | ICD-10-CM | POA: Diagnosis not present

## 2017-10-30 DIAGNOSIS — T463X5A Adverse effect of coronary vasodilators, initial encounter: Secondary | ICD-10-CM | POA: Diagnosis not present

## 2017-10-30 DIAGNOSIS — M1990 Unspecified osteoarthritis, unspecified site: Secondary | ICD-10-CM | POA: Diagnosis not present

## 2017-10-31 DIAGNOSIS — E039 Hypothyroidism, unspecified: Secondary | ICD-10-CM | POA: Diagnosis not present

## 2017-10-31 DIAGNOSIS — E782 Mixed hyperlipidemia: Secondary | ICD-10-CM | POA: Diagnosis not present

## 2017-10-31 DIAGNOSIS — R079 Chest pain, unspecified: Secondary | ICD-10-CM | POA: Diagnosis not present

## 2017-10-31 DIAGNOSIS — I959 Hypotension, unspecified: Secondary | ICD-10-CM | POA: Diagnosis not present

## 2017-10-31 DIAGNOSIS — I25110 Atherosclerotic heart disease of native coronary artery with unstable angina pectoris: Secondary | ICD-10-CM | POA: Diagnosis not present

## 2017-10-31 DIAGNOSIS — N183 Chronic kidney disease, stage 3 (moderate): Secondary | ICD-10-CM | POA: Diagnosis not present

## 2017-11-01 DIAGNOSIS — E782 Mixed hyperlipidemia: Secondary | ICD-10-CM | POA: Diagnosis not present

## 2017-11-01 DIAGNOSIS — N183 Chronic kidney disease, stage 3 (moderate): Secondary | ICD-10-CM | POA: Diagnosis not present

## 2017-11-01 DIAGNOSIS — I25110 Atherosclerotic heart disease of native coronary artery with unstable angina pectoris: Secondary | ICD-10-CM | POA: Diagnosis not present

## 2017-11-01 DIAGNOSIS — I959 Hypotension, unspecified: Secondary | ICD-10-CM | POA: Diagnosis not present

## 2017-11-01 DIAGNOSIS — E039 Hypothyroidism, unspecified: Secondary | ICD-10-CM | POA: Diagnosis not present

## 2017-11-01 DIAGNOSIS — R079 Chest pain, unspecified: Secondary | ICD-10-CM | POA: Diagnosis not present

## 2017-11-09 DIAGNOSIS — I259 Chronic ischemic heart disease, unspecified: Secondary | ICD-10-CM | POA: Diagnosis not present

## 2017-11-09 DIAGNOSIS — Z1339 Encounter for screening examination for other mental health and behavioral disorders: Secondary | ICD-10-CM | POA: Diagnosis not present

## 2017-11-09 DIAGNOSIS — I10 Essential (primary) hypertension: Secondary | ICD-10-CM | POA: Diagnosis not present

## 2017-11-09 DIAGNOSIS — Z1331 Encounter for screening for depression: Secondary | ICD-10-CM | POA: Diagnosis not present

## 2017-11-24 DIAGNOSIS — N183 Chronic kidney disease, stage 3 (moderate): Secondary | ICD-10-CM | POA: Diagnosis not present

## 2017-11-24 DIAGNOSIS — N200 Calculus of kidney: Secondary | ICD-10-CM | POA: Diagnosis not present

## 2017-11-24 DIAGNOSIS — I2510 Atherosclerotic heart disease of native coronary artery without angina pectoris: Secondary | ICD-10-CM | POA: Diagnosis not present

## 2017-11-24 DIAGNOSIS — E210 Primary hyperparathyroidism: Secondary | ICD-10-CM | POA: Diagnosis not present

## 2017-11-24 DIAGNOSIS — I129 Hypertensive chronic kidney disease with stage 1 through stage 4 chronic kidney disease, or unspecified chronic kidney disease: Secondary | ICD-10-CM | POA: Diagnosis not present

## 2017-12-21 DIAGNOSIS — E785 Hyperlipidemia, unspecified: Secondary | ICD-10-CM | POA: Diagnosis not present

## 2017-12-21 DIAGNOSIS — I340 Nonrheumatic mitral (valve) insufficiency: Secondary | ICD-10-CM | POA: Diagnosis not present

## 2017-12-21 DIAGNOSIS — G4733 Obstructive sleep apnea (adult) (pediatric): Secondary | ICD-10-CM | POA: Diagnosis not present

## 2017-12-21 DIAGNOSIS — N189 Chronic kidney disease, unspecified: Secondary | ICD-10-CM | POA: Diagnosis not present

## 2017-12-21 DIAGNOSIS — I129 Hypertensive chronic kidney disease with stage 1 through stage 4 chronic kidney disease, or unspecified chronic kidney disease: Secondary | ICD-10-CM | POA: Diagnosis not present

## 2017-12-21 DIAGNOSIS — Z955 Presence of coronary angioplasty implant and graft: Secondary | ICD-10-CM

## 2017-12-21 HISTORY — DX: Presence of coronary angioplasty implant and graft: Z95.5

## 2018-02-11 DIAGNOSIS — H35371 Puckering of macula, right eye: Secondary | ICD-10-CM | POA: Diagnosis not present

## 2018-02-11 DIAGNOSIS — H2512 Age-related nuclear cataract, left eye: Secondary | ICD-10-CM | POA: Diagnosis not present

## 2018-02-17 DIAGNOSIS — J069 Acute upper respiratory infection, unspecified: Secondary | ICD-10-CM | POA: Diagnosis not present

## 2018-02-17 DIAGNOSIS — R0602 Shortness of breath: Secondary | ICD-10-CM | POA: Diagnosis not present

## 2018-02-17 DIAGNOSIS — J209 Acute bronchitis, unspecified: Secondary | ICD-10-CM | POA: Diagnosis not present

## 2018-03-25 DIAGNOSIS — H2512 Age-related nuclear cataract, left eye: Secondary | ICD-10-CM | POA: Diagnosis not present

## 2018-03-29 ENCOUNTER — Encounter: Payer: Self-pay | Admitting: *Deleted

## 2018-04-12 ENCOUNTER — Other Ambulatory Visit: Payer: Self-pay

## 2018-04-12 ENCOUNTER — Ambulatory Visit: Payer: Medicare Other | Admitting: Anesthesiology

## 2018-04-12 ENCOUNTER — Ambulatory Visit
Admission: RE | Admit: 2018-04-12 | Discharge: 2018-04-12 | Disposition: A | Discharge status: Home / Self Care | Payer: Medicare Other | Source: Ambulatory Visit | Attending: Ophthalmology | Admitting: Ophthalmology

## 2018-04-12 ENCOUNTER — Encounter
Admission: RE | Disposition: A | Discharge status: Home / Self Care | Payer: Self-pay | Source: Ambulatory Visit | Attending: Ophthalmology

## 2018-04-12 DIAGNOSIS — Z79899 Other long term (current) drug therapy: Secondary | ICD-10-CM | POA: Insufficient documentation

## 2018-04-12 DIAGNOSIS — F40240 Claustrophobia: Secondary | ICD-10-CM | POA: Insufficient documentation

## 2018-04-12 DIAGNOSIS — I10 Essential (primary) hypertension: Secondary | ICD-10-CM | POA: Insufficient documentation

## 2018-04-12 DIAGNOSIS — Z7982 Long term (current) use of aspirin: Secondary | ICD-10-CM | POA: Diagnosis not present

## 2018-04-12 DIAGNOSIS — E039 Hypothyroidism, unspecified: Secondary | ICD-10-CM | POA: Insufficient documentation

## 2018-04-12 DIAGNOSIS — H2512 Age-related nuclear cataract, left eye: Secondary | ICD-10-CM | POA: Insufficient documentation

## 2018-04-12 DIAGNOSIS — I2510 Atherosclerotic heart disease of native coronary artery without angina pectoris: Secondary | ICD-10-CM | POA: Diagnosis not present

## 2018-04-12 DIAGNOSIS — E7800 Pure hypercholesterolemia, unspecified: Secondary | ICD-10-CM | POA: Insufficient documentation

## 2018-04-12 DIAGNOSIS — Z955 Presence of coronary angioplasty implant and graft: Secondary | ICD-10-CM | POA: Diagnosis not present

## 2018-04-12 DIAGNOSIS — F419 Anxiety disorder, unspecified: Secondary | ICD-10-CM | POA: Insufficient documentation

## 2018-04-12 DIAGNOSIS — K219 Gastro-esophageal reflux disease without esophagitis: Secondary | ICD-10-CM | POA: Diagnosis not present

## 2018-04-12 DIAGNOSIS — I252 Old myocardial infarction: Secondary | ICD-10-CM | POA: Insufficient documentation

## 2018-04-12 DIAGNOSIS — F329 Major depressive disorder, single episode, unspecified: Secondary | ICD-10-CM | POA: Insufficient documentation

## 2018-04-12 HISTORY — DX: Acute myocardial infarction, unspecified: I21.9

## 2018-04-12 HISTORY — DX: Dyspnea, unspecified: R06.00

## 2018-04-12 HISTORY — PX: CATARACT EXTRACTION W/PHACO: SHX586

## 2018-04-12 HISTORY — DX: Claustrophobia: F40.240

## 2018-04-12 SURGERY — PHACOEMULSIFICATION, CATARACT, WITH IOL INSERTION
Anesthesia: Monitor Anesthesia Care | Site: Eye | Laterality: Left | Wound class: "Clean "

## 2018-04-12 MED ORDER — NA CHONDROIT SULF-NA HYALURON 40-17 MG/ML IO SOLN
INTRAOCULAR | Status: DC | PRN
  Administered 2018-04-12: 1 mL via INTRAOCULAR

## 2018-04-12 MED ORDER — EPINEPHRINE PF 1 MG/ML IJ SOLN
INTRAMUSCULAR | Status: AC
  Filled 2018-04-12: qty 2

## 2018-04-12 MED ORDER — ARMC OPHTHALMIC DILATING DROPS
1.0000 "application " | OPHTHALMIC | Status: AC
  Administered 2018-04-12 (×3): 1 via OPHTHALMIC

## 2018-04-12 MED ORDER — CARBACHOL 0.01 % IO SOLN
INTRAOCULAR | Status: DC | PRN
Start: 2018-04-12 — End: 2018-04-12
  Administered 2018-04-12: 0.5 mL via INTRAOCULAR

## 2018-04-12 MED ORDER — POVIDONE-IODINE 5 % OP SOLN
OPHTHALMIC | Status: AC
  Filled 2018-04-12: qty 30

## 2018-04-12 MED ORDER — LIDOCAINE HCL (PF) 4 % IJ SOLN
INTRAOCULAR | Status: DC | PRN
  Administered 2018-04-12: 4 mL via OPHTHALMIC

## 2018-04-12 MED ORDER — LIDOCAINE HCL (PF) 4 % IJ SOLN
INTRAMUSCULAR | Status: AC
  Filled 2018-04-12: qty 5

## 2018-04-12 MED ORDER — MOXIFLOXACIN HCL 0.5 % OP SOLN
OPHTHALMIC | Status: DC | PRN
  Administered 2018-04-12: 0.2 mL via OPHTHALMIC

## 2018-04-12 MED ORDER — SODIUM CHLORIDE 0.9 % IV SOLN
INTRAVENOUS | Status: DC
  Administered 2018-04-12: 12:00:00 via INTRAVENOUS

## 2018-04-12 MED ORDER — ARMC OPHTHALMIC DILATING DROPS
OPHTHALMIC | Status: AC
  Administered 2018-04-12: 1 via OPHTHALMIC
  Filled 2018-04-12: qty 0.4

## 2018-04-12 MED ORDER — NA CHONDROIT SULF-NA HYALURON 40-17 MG/ML IO SOLN
INTRAOCULAR | Status: AC
  Filled 2018-04-12: qty 1

## 2018-04-12 MED ORDER — MOXIFLOXACIN HCL 0.5 % OP SOLN: 1.0000 [drp] | OPHTHALMIC | Status: DC | PRN

## 2018-04-12 MED ORDER — EPINEPHRINE PF 1 MG/ML IJ SOLN
INTRAOCULAR | Status: DC | PRN
  Administered 2018-04-12: 13:00:00 via OPHTHALMIC

## 2018-04-12 MED ORDER — MIDAZOLAM HCL 2 MG/2ML IJ SOLN
INTRAMUSCULAR | Status: AC
  Filled 2018-04-12: qty 2

## 2018-04-12 MED ORDER — MOXIFLOXACIN HCL 0.5 % OP SOLN
OPHTHALMIC | Status: AC
  Filled 2018-04-12: qty 3

## 2018-04-12 MED ORDER — MIDAZOLAM HCL 2 MG/2ML IJ SOLN
INTRAMUSCULAR | Status: DC | PRN
  Administered 2018-04-12 (×2): 1 mg via INTRAVENOUS

## 2018-04-12 MED ORDER — POVIDONE-IODINE 5 % OP SOLN
OPHTHALMIC | Status: DC | PRN
  Administered 2018-04-12: 1 via OPHTHALMIC

## 2018-04-12 SURGICAL SUPPLY — 16 items
GLOVE BIO SURGEON STRL SZ8 (GLOVE) ×3 IMPLANT
GLOVE BIOGEL M 6.5 STRL (GLOVE) ×3 IMPLANT
GLOVE SURG LX 8.0 MICRO (GLOVE) ×2
GLOVE SURG LX STRL 8.0 MICRO (GLOVE) ×1 IMPLANT
GOWN STRL REUS W/ TWL LRG LVL3 (GOWN DISPOSABLE) ×2 IMPLANT
GOWN STRL REUS W/TWL LRG LVL3 (GOWN DISPOSABLE) ×4
LABEL CATARACT MEDS ST (LABEL) ×3 IMPLANT
LENS IOL ACRYSOF IQ 20.5 (Intraocular Lens) ×2 IMPLANT
PACK CATARACT (MISCELLANEOUS) ×3 IMPLANT
PACK CATARACT BRASINGTON LX (MISCELLANEOUS) ×3 IMPLANT
PACK EYE AFTER SURG (MISCELLANEOUS) ×3 IMPLANT
SOL BSS BAG (MISCELLANEOUS) ×3
SOLUTION BSS BAG (MISCELLANEOUS) ×1 IMPLANT
SYR 5ML LL (SYRINGE) ×3 IMPLANT
WATER STERILE IRR 250ML POUR (IV SOLUTION) ×3 IMPLANT
WIPE NON LINTING 3.25X3.25 (MISCELLANEOUS) ×3 IMPLANT

## 2018-04-12 NOTE — Op Note (Signed)
PREOPERATIVE DIAGNOSIS:  Nuclear sclerotic cataract of the left eye.   POSTOPERATIVE DIAGNOSIS:  Nuclear sclerotic cataract of the left eye.   OPERATIVE PROCEDURE: Procedure(s): CATARACT EXTRACTION PHACO AND INTRAOCULAR LENS PLACEMENT (IOC)   SURGEON:  Birder Robson, MD.   ANESTHESIA:  Anesthesiologist: Alphonsus Sias, MD CRNA: Carron Curie, CRNA  1.      Managed anesthesia care. 2.     0.68ml of Shugarcaine was instilled following the paracentesis   COMPLICATIONS:  None.   TECHNIQUE:   Stop and chop   DESCRIPTION OF PROCEDURE:  The patient was examined and consented in the preoperative holding area where the aforementioned topical anesthesia was applied to the left eye and then brought back to the Operating Room where the left eye was prepped and draped in the usual sterile ophthalmic fashion and a lid speculum was placed. A paracentesis was created with the side port blade and the anterior chamber was filled with viscoelastic. A near clear corneal incision was performed with the steel keratome. A continuous curvilinear capsulorrhexis was performed with a cystotome followed by the capsulorrhexis forceps. Hydrodissection and hydrodelineation were carried out with BSS on a blunt cannula. The lens was removed in a stop and chop  technique and the remaining cortical material was removed with the irrigation-aspiration handpiece. The capsular bag was inflated with viscoelastic and the Technis [id number] lens was placed in the capsular bag without complication. The remaining viscoelastic was removed from the eye with the irrigation-aspiration handpiece. The wounds were hydrated. The anterior chamber was flushed with Miostat and the eye was inflated to physiologic pressure. 0.62ml Vigamox was placed in the anterior chamber. The wounds were found to be water tight. The eye was dressed with Vigamox. The patient was given protective glasses to wear throughout the day and a shield with which to sleep  tonight. The patient was also given drops with which to begin a drop regimen today and will follow-up with me in one day. Implant Name Type Inv. Item Serial No. Manufacturer Lot No. LRB No. Used  LENS IOL ACRYSOF IQ 20.5 - [id number] Intraocular Lens LENS IOL ACRYSOF IQ 20.5 [id number] [employer]CON  Left 1    Procedure(s) with comments: CATARACT EXTRACTION PHACO AND INTRAOCULAR LENS PLACEMENT (IOC) (Left) - [place] 00:39.5 AP% 16.7 CDE 6.62 Fluid Pack Lot # [id number]  Electronically signed: Birder Robson 04/12/2018 12:57 PM

## 2018-04-12 NOTE — Transfer of Care (Signed)
Immediate Anesthesia Transfer of Care Note  Patient: Ryan Mendoza  Procedure(s) Performed: CATARACT EXTRACTION PHACO AND INTRAOCULAR LENS PLACEMENT (IOC) (Left Eye)  Patient Location: PACU  Anesthesia Type:MAC  Level of Consciousness: awake  Airway & Oxygen Therapy: Patient Spontanous Breathing  Post-op Assessment: Report given to RN  Post vital signs: stable  Last Vitals:  Vitals Value Taken Time  BP    Temp    Pulse    Resp    SpO2      Last Pain:  Vitals:   04/12/18 1137  TempSrc: Oral  PainSc: 0-No pain         Complications: No apparent anesthesia complications

## 2018-04-12 NOTE — Anesthesia Preprocedure Evaluation (Signed)
Anesthesia Evaluation  Patient identified by MRN, date of birth, ID band Patient awake    Reviewed: Allergy & Precautions, H&P , NPO status , reviewed documented beta blocker date and time   Airway Mallampati: II  TM Distance: >3 FB     Dental  (+) Chipped   Pulmonary shortness of breath, former smoker,    Pulmonary exam normal        Cardiovascular hypertension, + CAD and + Past MI  Normal cardiovascular exam     Neuro/Psych PSYCHIATRIC DISORDERS Anxiety Depression    GI/Hepatic GERD  Medicated and Controlled,  Endo/Other  Hypothyroidism   Renal/GU      Musculoskeletal  (+) Arthritis ,   Abdominal   Peds  Hematology   Anesthesia Other Findings   Reproductive/Obstetrics                             Anesthesia Physical Anesthesia Plan  ASA: III  Anesthesia Plan: MAC   Post-op Pain Management:    Induction:   PONV Risk Score and Plan: TIVA  Airway Management Planned:   Additional Equipment:   Intra-op Plan:   Post-operative Plan:   Informed Consent: I have reviewed the patients History and Physical, chart, labs and discussed the procedure including the risks, benefits and alternatives for the proposed anesthesia with the patient or authorized representative who has indicated his/her understanding and acceptance.   Dental Advisory Given  Plan Discussed with: CRNA  Anesthesia Plan Comments:         Anesthesia Quick Evaluation

## 2018-04-12 NOTE — Anesthesia Postprocedure Evaluation (Signed)
Anesthesia Post Note  Patient: Ryan Mendoza  Procedure(s) Performed: CATARACT EXTRACTION PHACO AND INTRAOCULAR LENS PLACEMENT ([place]) (Left Eye)  Patient location during evaluation: PACU Anesthesia Type: MAC Level of consciousness: awake Pain management: pain level controlled Vital Signs Assessment: post-procedure vital signs reviewed and stable Respiratory status: spontaneous breathing Cardiovascular status: blood pressure returned to baseline Postop Assessment: no apparent nausea or vomiting Anesthetic complications: no     Last Vitals:  Vitals:   04/12/18 1137  BP: (!) 150/66  Pulse: (!) 52  Resp: 18  Temp: (!) 36.4 C  SpO2: 100%    Last Pain:  Vitals:   04/12/18 1137  TempSrc: Oral  PainSc: 0-No pain                 Carron Curie

## 2018-04-12 NOTE — Discharge Instructions (Signed)
FOLLOW DR. PORFILIO'S POSTOP EYE DROP INSTRUCTION SHEET AS REVIEWED.  Eye Surgery Discharge Instructions  Expect mild scratchy sensation or mild soreness. DO NOT RUB YOUR EYE!  The day of surgery:  Minimal physical activity, but bed rest is not required  No reading, computer work, or close hand work  No bending, lifting, or straining.  May watch TV  For 24 hours:  No driving, legal decisions, or alcoholic beverages  Safety precautions  Eat anything you prefer: It is better to start with liquids, then soup then solid foods.  _____ Eye patch should be worn until postoperative exam tomorrow.  ____ Solar shield eyeglasses should be worn for comfort in the sunlight/patch while sleeping  Resume all regular medications including aspirin or Coumadin if these were discontinued prior to surgery. You may shower, bathe, shave, or wash your hair. Tylenol may be taken for mild discomfort.  Call your doctor if you experience significant pain, nausea, or vomiting, fever > 101 or other signs of infection. [phone] or [phone] Specific instructions:  Follow-up Information    Birder Robson, MD Follow up.   Specialty:  Ophthalmology Why:  Wednesday 04/13/18 @ 10:35 am Contact information: [place] [address] [phone]

## 2018-04-12 NOTE — H&P (Signed)
All labs reviewed. Abnormal studies sent to patients PCP when indicated.  Previous H&P reviewed, patient examined, there are NO CHANGES.  Ryan Mendoza Porfilio4/30/201912:28 PM

## 2018-04-12 NOTE — Anesthesia Post-op Follow-up Note (Signed)
Anesthesia QCDR form completed.        

## 2018-05-18 DIAGNOSIS — K219 Gastro-esophageal reflux disease without esophagitis: Secondary | ICD-10-CM | POA: Diagnosis not present

## 2018-06-09 DIAGNOSIS — N183 Chronic kidney disease, stage 3 (moderate): Secondary | ICD-10-CM | POA: Diagnosis not present

## 2018-06-09 DIAGNOSIS — I129 Hypertensive chronic kidney disease with stage 1 through stage 4 chronic kidney disease, or unspecified chronic kidney disease: Secondary | ICD-10-CM | POA: Diagnosis not present

## 2018-06-09 DIAGNOSIS — E210 Primary hyperparathyroidism: Secondary | ICD-10-CM | POA: Diagnosis not present

## 2018-06-09 DIAGNOSIS — I2510 Atherosclerotic heart disease of native coronary artery without angina pectoris: Secondary | ICD-10-CM | POA: Diagnosis not present

## 2018-06-09 DIAGNOSIS — N200 Calculus of kidney: Secondary | ICD-10-CM | POA: Diagnosis not present

## 2018-06-20 IMAGING — NM NM PARATHYROID W/ SPECT
5 series · 20 of 20 positions shown · non-contrast
Comparison: None

Correlation: CT chest 05/31/2016

CLINICAL DATA: Primary hyperparathyroidism, hypercalcemia

EXAM:
NM PARATHYROID SCINTIGRAPHY AND SPECT IMAGING
TECHNIQUE: Following intravenous administration of radiopharmaceutical, early
and 2-hour delayed planar images were obtained in the anterior
projection. Delayed triplanar SPECT images were also obtained at 2
hours.
RADIOPHARMACEUTICALS:  26 mCi Dc-QQm Sestamibi IV

[Series 1: spect - (id)_(id)_tra · 8.3mm · 8.28mm/px · 6 of 64 frames shown]
[frame 6/64]
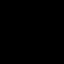
[frame 16/64]
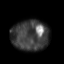
[frame 27/64]
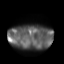
[frame 38/64]
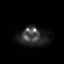
[frame 48/64]
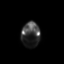
[frame 59/64]
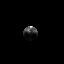

[Series 1: spect - (id)_(id)_cor · 8.3mm · 8.28mm/px · 6 of 64 frames shown]
[frame 6/64]
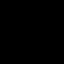
[frame 16/64]
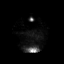
[frame 27/64]
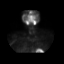
[frame 38/64]
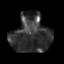
[frame 48/64]
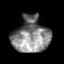
[frame 59/64]
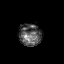

[Series 1: 15 min ant · 4.14mm/px · 1 of 1 slices shown]
[im 1/1]
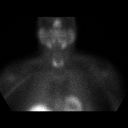

[Series 2: 2 hr ant · 4.14mm/px · 1 of 1 slices shown]
[im 1/1]
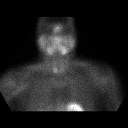

[Series 3: spect parathyroid · 8.28mm/px · 6 of 64 frames shown]
[frame 6/64]
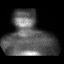
[frame 16/64]
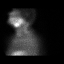
[frame 27/64]
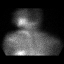
[frame 38/64]
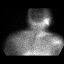
[frame 48/64]
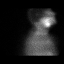
[frame 59/64]
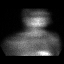

[20 of 20 positions shown; findings below may reference images not displayed]

FINDINGS: Planar imaging: Initial planar image demonstrates asymmetric tracer
distribution in RIGHT greater than LEFT thyroid lobes. Normal
washout of tracer from thyroid tissue with a focal area of abnormal
sestamibi retention at the inferior RIGHT thyroid lobe suspicious
for parathyroid adenoma.

SPECT imaging: Focal area of abnormal sestamibi retention at the
expected position of the RIGHT inferior parathyroid gland highly
suspicious for a parathyroid adenoma. No additional abnormal tracer
retention is seen at the remaining parathyroid glands. No ectopic
localization of sestamibi in the upper thorax or neck.
IMPRESSION: Planar and SPECT images highly suspicious for a RIGHT inferior
parathyroid adenoma.

## 2018-07-18 DIAGNOSIS — R079 Chest pain, unspecified: Secondary | ICD-10-CM | POA: Diagnosis not present

## 2018-07-18 DIAGNOSIS — R0789 Other chest pain: Secondary | ICD-10-CM | POA: Diagnosis not present

## 2018-07-19 DIAGNOSIS — I491 Atrial premature depolarization: Secondary | ICD-10-CM | POA: Diagnosis not present

## 2018-07-26 DIAGNOSIS — I340 Nonrheumatic mitral (valve) insufficiency: Secondary | ICD-10-CM | POA: Diagnosis not present

## 2018-07-26 DIAGNOSIS — I10 Essential (primary) hypertension: Secondary | ICD-10-CM | POA: Diagnosis not present

## 2018-07-26 DIAGNOSIS — Z955 Presence of coronary angioplasty implant and graft: Secondary | ICD-10-CM | POA: Diagnosis not present

## 2018-07-26 DIAGNOSIS — G4733 Obstructive sleep apnea (adult) (pediatric): Secondary | ICD-10-CM | POA: Diagnosis not present

## 2018-07-26 DIAGNOSIS — E785 Hyperlipidemia, unspecified: Secondary | ICD-10-CM | POA: Diagnosis not present

## 2018-07-28 DIAGNOSIS — R002 Palpitations: Secondary | ICD-10-CM | POA: Diagnosis not present

## 2018-07-28 DIAGNOSIS — I340 Nonrheumatic mitral (valve) insufficiency: Secondary | ICD-10-CM | POA: Diagnosis not present

## 2018-09-05 DIAGNOSIS — Z23 Encounter for immunization: Secondary | ICD-10-CM | POA: Diagnosis not present

## 2018-11-24 DIAGNOSIS — L820 Inflamed seborrheic keratosis: Secondary | ICD-10-CM | POA: Diagnosis not present

## 2018-11-24 DIAGNOSIS — L111 Transient acantholytic dermatosis [Grover]: Secondary | ICD-10-CM | POA: Diagnosis not present

## 2018-11-24 DIAGNOSIS — I8312 Varicose veins of left lower extremity with inflammation: Secondary | ICD-10-CM | POA: Diagnosis not present

## 2018-11-24 DIAGNOSIS — D225 Melanocytic nevi of trunk: Secondary | ICD-10-CM | POA: Diagnosis not present

## 2018-11-24 DIAGNOSIS — L821 Other seborrheic keratosis: Secondary | ICD-10-CM | POA: Diagnosis not present

## 2018-11-24 DIAGNOSIS — I8311 Varicose veins of right lower extremity with inflammation: Secondary | ICD-10-CM | POA: Diagnosis not present

## 2018-11-24 DIAGNOSIS — L570 Actinic keratosis: Secondary | ICD-10-CM | POA: Diagnosis not present

## 2018-11-26 DIAGNOSIS — J019 Acute sinusitis, unspecified: Secondary | ICD-10-CM | POA: Diagnosis not present

## 2018-11-30 DIAGNOSIS — E039 Hypothyroidism, unspecified: Secondary | ICD-10-CM | POA: Diagnosis not present

## 2018-11-30 DIAGNOSIS — N189 Chronic kidney disease, unspecified: Secondary | ICD-10-CM | POA: Diagnosis not present

## 2018-11-30 DIAGNOSIS — Z79899 Other long term (current) drug therapy: Secondary | ICD-10-CM | POA: Diagnosis not present

## 2018-11-30 DIAGNOSIS — D510 Vitamin B12 deficiency anemia due to intrinsic factor deficiency: Secondary | ICD-10-CM | POA: Diagnosis not present

## 2018-11-30 DIAGNOSIS — Z0000 Encounter for general adult medical examination without abnormal findings: Secondary | ICD-10-CM | POA: Diagnosis not present

## 2018-12-02 ENCOUNTER — Ambulatory Visit: Payer: Medicare Other | Admitting: Cardiology

## 2018-12-02 ENCOUNTER — Telehealth: Payer: Self-pay | Admitting: Emergency Medicine

## 2018-12-02 NOTE — Telephone Encounter (Signed)
Patient wasn't aware of appointment today. He is unable to come today due to his wife being sick. Patient has been rescheduled to next Thursday 12/26. Dr. Agustin Cree aware

## 2018-12-08 ENCOUNTER — Encounter: Payer: Self-pay | Admitting: Cardiology

## 2018-12-08 ENCOUNTER — Ambulatory Visit (INDEPENDENT_AMBULATORY_CARE_PROVIDER_SITE_OTHER): Payer: Medicare Other | Admitting: Cardiology

## 2018-12-08 DIAGNOSIS — G4733 Obstructive sleep apnea (adult) (pediatric): Secondary | ICD-10-CM | POA: Insufficient documentation

## 2018-12-08 DIAGNOSIS — I2510 Atherosclerotic heart disease of native coronary artery without angina pectoris: Secondary | ICD-10-CM

## 2018-12-08 DIAGNOSIS — R0609 Other forms of dyspnea: Secondary | ICD-10-CM

## 2018-12-08 DIAGNOSIS — R0600 Dyspnea, unspecified: Secondary | ICD-10-CM | POA: Insufficient documentation

## 2018-12-08 DIAGNOSIS — I25119 Atherosclerotic heart disease of native coronary artery with unspecified angina pectoris: Secondary | ICD-10-CM | POA: Insufficient documentation

## 2018-12-08 DIAGNOSIS — I340 Nonrheumatic mitral (valve) insufficiency: Secondary | ICD-10-CM | POA: Insufficient documentation

## 2018-12-08 HISTORY — DX: Obstructive sleep apnea (adult) (pediatric): G47.33

## 2018-12-08 HISTORY — DX: Atherosclerotic heart disease of native coronary artery with unspecified angina pectoris: I25.119

## 2018-12-08 HISTORY — DX: Nonrheumatic mitral (valve) insufficiency: I34.0

## 2018-12-08 MED ORDER — NITROGLYCERIN 0.4 MG SL SUBL: 0.4000 mg | SUBLINGUAL_TABLET | SUBLINGUAL | 11 refills | Status: DC | PRN

## 2018-12-08 NOTE — Progress Notes (Signed)
Cardiology Office Note:    Date:  12/08/2018   ID:  Ryan Mendoza, DOB July 23, 1934, MRN [id number]  PCP:  Angelina Sheriff, MD  Cardiologist:  Jenne Campus, MD    Referring MD: Angelina Sheriff, MD   Chief Complaint  Patient presents with  . Follow-up  I have shortness of breath  History of Present Illness:    Ryan Mendoza is a 84 y.o. male with coronary disease status post PTCA and stenting of LAD and circumflex 2 years ago comes to our office complaining of having shortness of breath he Diette have similar complaint in the summer however now got worse he end up having bronchitis treated with antibiotic getting better but still short of breath.  It is summertime he had echocardiogram done which showed preserved left ventricular ejection fraction he did have mild to moderate mitral regurgitation.  He also got monitor which showed some PVCs and APCs but no sustained arrhythmia denies have any chest pain tightness squeezing pressure burning chest but shortness of breath with exertion bothers him a lot.  No proximal nocturnal dyspnea, did have some swelling of lower extremities but it resolved  Past Medical History:  Diagnosis Date  . Anxiety   . Aortic valve regurgitation   . Arthritis    hands and knees  . BPH (benign prostatic hyperplasia)   . Cataract, left eye   . Claustrophobia   . Coronary artery disease   . Depression    no current meds for  . Dyspnea    DOE  . Elevated cholesterol   . Gallstones   . GERD (gastroesophageal reflux disease)   . History of kidney stones   . Hypertension   . Hypothyroidism   . Myocardial infarction ([place])    SILENT  . Primary hyperparathyroidism ([place])   . 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 Tinnitus    both ears all the time    Past Surgical History:  Procedure Laterality Date  . CATARACT EXTRACTION W/PHACO Left 04/12/2018   Procedure: CATARACT EXTRACTION PHACO AND  INTRAOCULAR LENS PLACEMENT (IOC);  Surgeon: Birder Robson, MD;  Location: [facility] ORS;  Service: Ophthalmology;  Laterality: Left;  [place] 00:39.5 AP% 16.7 CDE 6.62 Fluid Pack Lot # [id number]  . CORONARY ANGIOPLASTY     2017  . EYE SURGERY Right    ioc for cataract  . PARATHYROIDECTOMY Right 09/28/2017   Procedure: RIGHT INFERIOR PARATHYROIDECTOMY;  Surgeon: Armandina Gemma, MD;  Location: [facility] ORS;  Service: General;  Laterality: Right;  . right eye  plug for tear duct surgery    . stents to heart x 2  06/2016    Current Medications: Current Meds  Medication Sig  . acetaminophen (TYLENOL) 500 MG tablet Take 1,000 mg by mouth every 6 (six) hours as needed for moderate pain or headache.  Marland Kitchen amLODipine (NORVASC) 10 MG tablet Take 10 mg by mouth at bedtime.   Marland Kitchen aspirin EC 81 MG tablet Take 81 mg by mouth at bedtime.   Marland Kitchen atorvastatin (LIPITOR) 40 MG tablet Take 40 mg by mouth at bedtime.   . cloNIDine (CATAPRES) 0.1 MG tablet Take 0.1 mg by mouth 2 (two) times daily with a meal.   . famotidine (PEPCID) 40 MG tablet Take 40 mg by mouth 2 (two) times daily.  Marland Kitchen levothyroxine (SYNTHROID, LEVOTHROID) 50 MCG tablet Take 50 mcg by mouth at bedtime.   . Multiple Vitamins-Minerals (  CENTRUM SILVER PO) Take 1 tablet by mouth daily.  . nitroGLYCERIN (NITROSTAT) 0.4 MG SL tablet Place 0.4 mg under the tongue every 5 (five) minutes as needed for chest pain.  . traZODone (DESYREL) 50 MG tablet Take 50 mg by mouth at bedtime as needed for sleep.     Allergies:   Patient has no known allergies.   Social History   Socioeconomic History  . Marital status: Married    Spouse name: Not on file  . Number of children: Not on file  . Years of education: Not on file  . Highest education level: Not on file  Occupational History  . Not on file  Social Needs  . Financial resource strain: Not on file  . Food insecurity:    Worry: Not on file    Inability: Not on file  . Transportation needs:    Medical: Not on  file    Non-medical: Not on file  Tobacco Use  . Smoking status: Former Smoker    Packs/day: 1.00    Years: 10.00    Pack years: 10.00    Types: Cigarettes  . Smokeless tobacco: Never Used  . Tobacco comment: quit 1974  Substance and Sexual Activity  . Alcohol use: No  . Drug use: No  . Sexual activity: Not on file  Lifestyle  . Physical activity:    Days per week: Not on file    Minutes per session: Not on file  . Stress: Not on file  Relationships  . Social connections:    Talks on phone: Not on file    Gets together: Not on file    Attends religious service: Not on file    Active member of club or organization: Not on file    Attends meetings of clubs or organizations: Not on file    Relationship status: Not on file  Other Topics Concern  . Not on file  Social History Narrative  . Not on file     Family History: The patient's family history includes Heart attack in his father and mother; Heart disease in his father and mother. ROS:   Please see the history of present illness.    All 14 point review of systems negative except as described per history of present illness  EKGs/Labs/Other Studies Reviewed:      Recent Labs: No results found for requested labs within last 8760 hours.  Recent Lipid Panel No results found for: CHOL, TRIG, HDL, CHOLHDL, VLDL, LDLCALC, LDLDIRECT  Physical Exam:    VS:  BP (!) 150/60   Pulse 66   Ht 5\' 8"  (1.727 m)   Wt 194 lb 6.4 oz (88.2 kg)   SpO2 96%   BMI 29.56 kg/m     Wt Readings from Last 3 Encounters:  12/08/18 194 lb 6.4 oz (88.2 kg)  04/12/18 190 lb (86.2 kg)  09/24/17 196 lb 6.4 oz (89.1 kg)     GEN:  Well nourished, well developed in no acute distress HEENT: Normal NECK: No JVD; No carotid bruits LYMPHATICS: No lymphadenopathy CARDIAC: RRR, holosystolic murmur grade 2/6 best heard left border of sternum and apex radiating towards axilla, no rubs, no gallops RESPIRATORY:  Clear to auscultation without rales,  wheezing or rhonchi  ABDOMEN: Soft, non-tender, non-distended MUSCULOSKELETAL:  No edema; No deformity  SKIN: Warm and dry LOWER EXTREMITIES: no swelling NEUROLOGIC:  Alert and oriented x 3 PSYCHIATRIC:  Normal affect   ASSESSMENT:    1. Nonrheumatic mitral valve regurgitation  2. Dyspnea on exertion   3. Coronary artery disease involving native coronary artery of native heart without angina pectoris   4. Obstructive sleep apnea    PLAN:    In order of problems listed above:  1. Coronary artery disease stable he does have some shortness of breath echocardiogram done in summer showed mild to moderate mitral regurgitation therefore he does not justify degree of shortness of breath that he has.  I will ask him to have a stress test to make sure there is no inducible ischemia since he may have angina equivalent. 2. Mitral regurgitation if stress test will be negative we will may consider have a better look at his mitral valve either by doing transthoracic may begin transesophageal echocardiogram. 3. Obstructive sleep apnea management per medicine team. 4. Dyspnea on exertion work-up as above.  I will ask him to have pulse oximetry today with exercise.   Medication Adjustments/Labs and Tests Ordered: Current medicines are reviewed at length with the patient today.  Concerns regarding medicines are outlined above.  No orders of the defined types were placed in this encounter.  Medication changes: No orders of the defined types were placed in this encounter.   Signed, Park Liter, MD, [facility] 12/08/2018 3:53 PM    [place]

## 2018-12-08 NOTE — Progress Notes (Signed)
Patient ambulated with the pulse oximetry. His Heart rate went from 74 to 80 back to 74 and oxygen saturation stayed at 98 % the whole time.

## 2018-12-08 NOTE — Patient Instructions (Signed)
Medication Instructions:  Your physician has recommended you make the following change in your medication:   Take as needed for chest pain: Nitroglycerin 0.4 mg sublingual (under your tongue) as needed for chest pain. If experiencing chest pain, stop what you are doing and sit down. Take 1 nitroglycerin and wait 5 minutes. If chest pain continues, take another nitroglycerin and wait 5 minutes. If chest pain does not subside, take 1 more nitroglycerin and dial 911. You make take a total of 3 nitroglycerin in a 15 minute time frame.   If you need a refill on your cardiac medications before your next appointment, please call your pharmacy.   Lab work: None.  If you have labs (blood work) drawn today and your tests are completely normal, you will receive your results only by: Marland Kitchen MyChart Message (if you have MyChart) OR . A paper copy in the mail If you have any lab test that is abnormal or we need to change your treatment, we will call you to review the results.  Testing/Procedures: Your physician has requested that you have a stress echocardiogram. For further information please visit [email or web address]. Please follow instruction sheet as given.    Follow-Up: At [facility], you and your health needs are our priority.  As part of our continuing mission to provide you with exceptional heart care, we have created designated Provider Care Teams.  These Care Teams include your primary Cardiologist (physician) and Advanced Practice Providers (APPs -  Physician Assistants and Nurse Practitioners) who all work together to provide you with the care you need, when you need it. You will need a follow up appointment in 1 months.  Please call our office 2 months in advance to schedule this appointment.  You may see No primary care provider on file. or another member of our [employer] Provider Team in [place]: Shirlee More, MD . Jyl Heinz, MD  Any Other Special Instructions Will Be Listed  Below (If Applicable).   Exercise Stress Echocardiogram  An exercise stress echocardiogram is a test to check how well your heart is working. This test uses sound waves (ultrasound) and a computer to make images of your heart before and after exercise. Ultrasound images that are taken before you exercise (your resting echocardiogram) will show how much blood is getting to your heart muscle and how well your heart muscle and heart valves are functioning. During the next part of this test, you will walk on a treadmill or ride a stationary bike to see how exercise affects your heart. While you exercise, the electrical activity of your heart will be monitored with an electrocardiogram (ECG). Your blood pressure will also be monitored. You may have this test if you:  Have chest pain or other symptoms of a heart problem.  Recently had a heart attack or heart surgery.  Have heart valve problems.  Have a condition that causes narrowing of the blood vessels that supply your heart (coronary artery disease).  Have a high risk of heart disease and are starting a new exercise program.  Have a high risk of heart disease and need to have major surgery. Tell a health care provider about:  Any allergies you have.  All medicines you are taking, including vitamins, herbs, eye drops, creams, and over-the-counter medicines.  Any problems you or family members have had with anesthetic medicines.  Any blood disorders you have.  Any surgeries you have had.  Any medical conditions you have.  Whether you are pregnant or may  be pregnant. What are the risks? Generally, this is a safe procedure. However, problems may occur, including:  Chest pain.  Dizziness or light-headedness.  Shortness of breath.  Increased or irregular heartbeat (palpitations).  Nausea or vomiting.  Heart attack (very rare). What happens before the procedure?  Follow instructions from your health care provider about  eating or drinking restrictions. You may be asked to avoid all forms of caffeine for 24 hours before your procedure, or as told by your health care provider.  Ask your health care provider about changing or stopping your regular medicines. This is especially important if you are taking diabetes medicines or blood thinners.  If you use an inhaler, bring it with you to the test.  Wear loose, comfortable clothing and walking shoes.  Do notuse any products that contain nicotine or tobacco, such as cigarettes and e-cigarettes, for 4 hours before the test or as told by your health care provider. If you need help quitting, ask your health care provider. What happens during the procedure?  You will take off your clothes from the waist up and put on a hospital gown.  A technician will place electrodes on your chest.  A blood pressure cuff will be placed on your arm.  You will lie down on a table for an ultrasound exam before you exercise. Gel will be rubbed on your chest, and a handheld device (transducer) will be pressed against your chest and moved over your heart.  Then, you will start exercising by walking on a treadmill or pedaling a stationary bicycle.  Your blood pressure and heart rhythm will be monitored while you exercise.  The exercise will gradually get harder or faster.  You will exercise until: ? Your heart reaches a target level. ? You are too tired to continue. ? You cannot continue because of chest pain, weakness, or dizziness.  You will have another ultrasound exam after you stop exercising. The procedure may vary among health care providers and hospitals. What happens after the procedure?  Your heart rate and blood pressure will be monitored until they return to your normal levels. Summary  An exercise stress echocardiogram is a test that uses ultrasound to check how well your heart works before and after exercise.  Before the test, follow instructions from your  health care provider about stopping medications, avoiding nicotine and tobacco, and avoiding certain foods and drinks.  During the test, your blood pressure and heart rhythm will be monitored while you exercise on a treadmill or stationary bicycle. This information is not intended to replace advice given to you by your health care provider. Make sure you discuss any questions you have with your health care provider. Document Released: 12/04/2004 Document Revised: 07/22/2016 Document Reviewed: 07/22/2016 Elsevier Interactive Patient Education  2019 [place].  Nitroglycerin sublingual tablets What is this medicine? NITROGLYCERIN (nye troe GLI ser in) is a type of vasodilator. It relaxes blood vessels, increasing the blood and oxygen supply to your heart. This medicine is used to relieve chest pain caused by angina. It is also used to prevent chest pain before activities like climbing stairs, going outdoors in cold weather, or sexual activity. This medicine may be used for other purposes; ask your health care provider or pharmacist if you have questions. COMMON BRAND NAME(S): Nitroquick, Nitrostat, Nitrotab What should I tell my health care provider before I take this medicine? They need to know if you have any of these conditions: -anemia -head injury, recent stroke, or bleeding in  the brain -liver disease -previous heart attack -an unusual or allergic reaction to nitroglycerin, other medicines, foods, dyes, or preservatives -pregnant or trying to get pregnant -breast-feeding How should I use this medicine? Take this medicine by mouth as needed. At the first sign of an angina attack (chest pain or tightness) place one tablet under your tongue. You can also take this medicine 5 to 10 minutes before an event likely to produce chest pain. Follow the directions on the prescription label. Let the tablet dissolve under the tongue. Do not swallow whole. Replace the dose if you accidentally swallow  it. It will help if your mouth is not dry. Saliva around the tablet will help it to dissolve more quickly. Do not eat or drink, smoke or chew tobacco while a tablet is dissolving. If you are not better within 5 minutes after taking ONE dose of nitroglycerin, call 9-1-1 immediately to seek emergency medical care. Do not take more than 3 nitroglycerin tablets over 15 minutes. If you take this medicine often to relieve symptoms of angina, your doctor or health care professional may provide you with different instructions to manage your symptoms. If symptoms do not go away after following these instructions, it is important to call 9-1-1 immediately. Do not take more than 3 nitroglycerin tablets over 15 minutes. Talk to your pediatrician regarding the use of this medicine in children. Special care may be needed. Overdosage: If you think you have taken too much of this medicine contact a poison control center or emergency room at once. NOTE: This medicine is only for you. Do not share this medicine with others. What if I miss a dose? This does not apply. This medicine is only used as needed. What may interact with this medicine? Do not take this medicine with any of the following medications: -certain migraine medicines like ergotamine and dihydroergotamine (DHE) -medicines used to treat erectile dysfunction like sildenafil, tadalafil, and vardenafil -riociguat This medicine may also interact with the following medications: -alteplase -aspirin -heparin -medicines for high blood pressure -medicines for mental depression -other medicines used to treat angina -phenothiazines like chlorpromazine, mesoridazine, prochlorperazine, thioridazine This list may not describe all possible interactions. Give your health care provider a list of all the medicines, herbs, non-prescription drugs, or dietary supplements you use. Also tell them if you smoke, drink alcohol, or use illegal drugs. Some items may interact  with your medicine. What should I watch for while using this medicine? Tell your doctor or health care professional if you feel your medicine is no longer working. Keep this medicine with you at all times. Sit or lie down when you take your medicine to prevent falling if you feel dizzy or faint after using it. Try to remain calm. This will help you to feel better faster. If you feel dizzy, take several deep breaths and lie down with your feet propped up, or bend forward with your head resting between your knees. You may get drowsy or dizzy. Do not drive, use machinery, or do anything that needs mental alertness until you know how this drug affects you. Do not stand or sit up quickly, especially if you are an older patient. This reduces the risk of dizzy or fainting spells. Alcohol can make you more drowsy and dizzy. Avoid alcoholic drinks. Do not treat yourself for coughs, colds, or pain while you are taking this medicine without asking your doctor or health care professional for advice. Some ingredients may increase your blood pressure. What side effects may  I notice from receiving this medicine? Side effects that you should report to your doctor or health care professional as soon as possible: -blurred vision -dry mouth -skin rash -sweating -the feeling of extreme pressure in the head -unusually weak or tired Side effects that usually do not require medical attention (report to your doctor or health care professional if they continue or are bothersome): -flushing of the face or neck -headache -irregular heartbeat, palpitations -nausea, vomiting This list may not describe all possible side effects. Call your doctor for medical advice about side effects. You may report side effects to FDA at 1-800-FDA-1088. Where should I keep my medicine? Keep out of the reach of children. Store at room temperature between 20 and 25 degrees C (68 and 77 degrees F). Store in [employer]. Protect from  light and moisture. Keep tightly closed. Throw away any unused medicine after the expiration date. NOTE: This sheet is a summary. It may not cover all possible information. If you have questions about this medicine, talk to your doctor, pharmacist, or health care provider.  2019 Elsevier/Gold Standard (2013-09-28 17:57:36)

## 2018-12-08 NOTE — Addendum Note (Signed)
Addended by: Ashok Norris on: 12/08/2018 04:19 PM   Modules accepted: Orders

## 2018-12-19 ENCOUNTER — Ambulatory Visit (INDEPENDENT_AMBULATORY_CARE_PROVIDER_SITE_OTHER): Payer: Medicare Other

## 2018-12-19 DIAGNOSIS — I340 Nonrheumatic mitral (valve) insufficiency: Secondary | ICD-10-CM | POA: Diagnosis not present

## 2018-12-19 DIAGNOSIS — I2510 Atherosclerotic heart disease of native coronary artery without angina pectoris: Secondary | ICD-10-CM | POA: Diagnosis not present

## 2018-12-19 DIAGNOSIS — R0609 Other forms of dyspnea: Secondary | ICD-10-CM | POA: Diagnosis not present

## 2018-12-19 NOTE — Progress Notes (Signed)
Complete stress echocardiogram with limited exam has been performed.  Jimmy Laurin Paulo RDCS, RVT

## 2019-01-06 ENCOUNTER — Encounter: Payer: Self-pay | Admitting: Cardiology

## 2019-01-06 DIAGNOSIS — Z87442 Personal history of urinary calculi: Secondary | ICD-10-CM | POA: Diagnosis not present

## 2019-01-06 DIAGNOSIS — E039 Hypothyroidism, unspecified: Secondary | ICD-10-CM | POA: Diagnosis not present

## 2019-01-06 DIAGNOSIS — I2510 Atherosclerotic heart disease of native coronary artery without angina pectoris: Secondary | ICD-10-CM | POA: Diagnosis not present

## 2019-01-06 DIAGNOSIS — R110 Nausea: Secondary | ICD-10-CM | POA: Diagnosis not present

## 2019-01-06 DIAGNOSIS — H9190 Unspecified hearing loss, unspecified ear: Secondary | ICD-10-CM | POA: Diagnosis not present

## 2019-01-06 DIAGNOSIS — Z955 Presence of coronary angioplasty implant and graft: Secondary | ICD-10-CM | POA: Diagnosis not present

## 2019-01-06 DIAGNOSIS — Z7982 Long term (current) use of aspirin: Secondary | ICD-10-CM | POA: Diagnosis not present

## 2019-01-06 DIAGNOSIS — R0602 Shortness of breath: Secondary | ICD-10-CM | POA: Diagnosis not present

## 2019-01-06 DIAGNOSIS — R079 Chest pain, unspecified: Secondary | ICD-10-CM | POA: Diagnosis not present

## 2019-01-06 DIAGNOSIS — M19041 Primary osteoarthritis, right hand: Secondary | ICD-10-CM | POA: Diagnosis not present

## 2019-01-06 DIAGNOSIS — E785 Hyperlipidemia, unspecified: Secondary | ICD-10-CM | POA: Diagnosis not present

## 2019-01-06 DIAGNOSIS — M19042 Primary osteoarthritis, left hand: Secondary | ICD-10-CM | POA: Diagnosis not present

## 2019-01-06 DIAGNOSIS — I252 Old myocardial infarction: Secondary | ICD-10-CM | POA: Diagnosis not present

## 2019-01-06 DIAGNOSIS — Z79899 Other long term (current) drug therapy: Secondary | ICD-10-CM | POA: Diagnosis not present

## 2019-01-06 DIAGNOSIS — R0789 Other chest pain: Secondary | ICD-10-CM | POA: Diagnosis not present

## 2019-01-06 DIAGNOSIS — M170 Bilateral primary osteoarthritis of knee: Secondary | ICD-10-CM | POA: Diagnosis not present

## 2019-01-06 DIAGNOSIS — I4891 Unspecified atrial fibrillation: Secondary | ICD-10-CM | POA: Diagnosis not present

## 2019-01-06 DIAGNOSIS — I10 Essential (primary) hypertension: Secondary | ICD-10-CM | POA: Diagnosis not present

## 2019-01-06 DIAGNOSIS — K219 Gastro-esophageal reflux disease without esophagitis: Secondary | ICD-10-CM | POA: Diagnosis not present

## 2019-01-10 DIAGNOSIS — K219 Gastro-esophageal reflux disease without esophagitis: Secondary | ICD-10-CM | POA: Diagnosis not present

## 2019-01-10 DIAGNOSIS — I10 Essential (primary) hypertension: Secondary | ICD-10-CM | POA: Diagnosis not present

## 2019-01-10 DIAGNOSIS — R1031 Right lower quadrant pain: Secondary | ICD-10-CM | POA: Diagnosis not present

## 2019-01-12 ENCOUNTER — Ambulatory Visit: Payer: Medicare Other | Admitting: Cardiology

## 2019-01-12 ENCOUNTER — Encounter: Payer: Self-pay | Admitting: Cardiology

## 2019-01-12 VITALS — BP 130/60 | HR 58 | Ht 68.0 in | Wt 190.0 lb

## 2019-01-12 DIAGNOSIS — R0609 Other forms of dyspnea: Secondary | ICD-10-CM

## 2019-01-12 DIAGNOSIS — G4733 Obstructive sleep apnea (adult) (pediatric): Secondary | ICD-10-CM | POA: Diagnosis not present

## 2019-01-12 DIAGNOSIS — I340 Nonrheumatic mitral (valve) insufficiency: Secondary | ICD-10-CM | POA: Diagnosis not present

## 2019-01-12 DIAGNOSIS — I2510 Atherosclerotic heart disease of native coronary artery without angina pectoris: Secondary | ICD-10-CM

## 2019-01-12 NOTE — Addendum Note (Signed)
Addended by: Aleatha Borer on: 01/12/2019 04:43 PM   Modules accepted: Orders

## 2019-01-12 NOTE — Patient Instructions (Signed)
Medication Instructions:  Your physician recommends that you continue on your current medications as directed. Please refer to the Current Medication list given to you today.  If you need a refill on your cardiac medications before your next appointment, please call your pharmacy.   Lab work: None ordered If you have labs (blood work) drawn today and your tests are completely normal, you will receive your results only by: Marland Kitchen MyChart Message (if you have MyChart) OR . A paper copy in the mail If you have any lab test that is abnormal or we need to change your treatment, we will call you to review the results.  Testing/Procedures: Your physician has requested that you have an echocardiogram. Echocardiography is a painless test that uses sound waves to create images of your heart. It provides your doctor with information about the size and shape of your heart and how well your heart's chambers and valves are working. This procedure takes approximately one hour. There are no restrictions for this procedure.  Follow-Up: At [facility], you and your health needs are our priority.  As part of our continuing mission to provide you with exceptional heart care, we have created designated Provider Care Teams.  These Care Teams include your primary Cardiologist (physician) and Advanced Practice Providers (APPs -  Physician Assistants and Nurse Practitioners) who all work together to provide you with the care you need, when you need it. You will need a follow up appointment in 1 months.   You may see Jenne Campus or another member of our [employer] Provider Team in [place]: Shirlee More, MD . Jyl Heinz, MD  Any Other Special Instructions Will Be Listed Below (If Applicable).

## 2019-01-12 NOTE — Progress Notes (Signed)
Cardiology Office Note:    Date:  01/12/2019   ID:  Ryan Mendoza, DOB August 20, 1934, MRN [id number]  PCP:  Angelina Sheriff, MD  Cardiologist:  Jenne Campus, MD    Referring MD: Angelina Sheriff, MD   Chief Complaint  Patient presents with  . Follow up on Stress test  . Extremity Weakness  I was in the hospital in [place]  History of Present Illness:    Ryan Mendoza is a 85 y.o. male with coronary artery disease, status post PTCA and stenting of LAD and circumflex in 2017.  Recently he ended up going to hospital in [place] because of chest pain that he developed in the middle of the night.  Cardiac catheterization was done and according to him cardiac catheterization was normal he was told to have a problem with the stomach and he was given some extra medication for his stomach which seems to be helping however he feels awful tired exhausted.  He said that his ability to exercise is fairly decent does get some shortness of breath with exertion recently I did stress test on him to rule out for ischemia he walked 7 minutes on the treadmill there was no evidence of ischemia.  Of course the question is about his mitral regurgitation he did have moderate regurgitation December of last year I think we must recheck his echocardiogram to recheck mitral valve regurgitation.  Past Medical History:  Diagnosis Date  . Anxiety   . Aortic valve regurgitation   . Arthritis    hands and knees  . BPH (benign prostatic hyperplasia)   . Cataract, left eye   . Claustrophobia   . Coronary artery disease   . Depression    no current meds for  . Dyspnea    DOE  . Elevated cholesterol   . Gallstones   . GERD (gastroesophageal reflux disease)   . History of kidney stones   . Hypertension   . Hypothyroidism   . Myocardial infarction ([place])    SILENT  . Primary hyperparathyroidism ([place])   . 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 Tinnitus    both  ears all the time    Past Surgical History:  Procedure Laterality Date  . CATARACT EXTRACTION W/PHACO Left 04/12/2018   Procedure: CATARACT EXTRACTION PHACO AND INTRAOCULAR LENS PLACEMENT (IOC);  Surgeon: Birder Robson, MD;  Location: [facility] ORS;  Service: Ophthalmology;  Laterality: Left;  [place] 00:39.5 AP% 16.7 CDE 6.62 Fluid Pack Lot # [id number]  . CORONARY ANGIOPLASTY     2017  . EYE SURGERY Right    ioc for cataract  . PARATHYROIDECTOMY Right 09/28/2017   Procedure: RIGHT INFERIOR PARATHYROIDECTOMY;  Surgeon: Armandina Gemma, MD;  Location: [facility];  Service: General;  Laterality: Right;  . right eye  plug for tear duct surgery    . stents to heart x 2  06/2016    Current Medications: Current Meds  Medication Sig  . acetaminophen (TYLENOL) 500 MG tablet Take 1,000 mg by mouth every 6 (six) hours as needed for moderate pain or headache.  Marland Kitchen amLODipine (NORVASC) 10 MG tablet Take 10 mg by mouth at bedtime.   Marland Kitchen aspirin EC 81 MG tablet Take 81 mg by mouth at bedtime.   Marland Kitchen atorvastatin (LIPITOR) 40 MG tablet Take 40 mg by mouth at bedtime.   . cloNIDine (CATAPRES) 0.1 MG tablet Take 0.1 mg by mouth 2 (two)  times daily with a meal.   . levothyroxine (SYNTHROID, LEVOTHROID) 50 MCG tablet Take 50 mcg by mouth at bedtime.   . Multiple Vitamins-Minerals (CENTRUM SILVER PO) Take 1 tablet by mouth daily.  . nitroGLYCERIN (NITROSTAT) 0.4 MG SL tablet Place 1 tablet (0.4 mg total) under the tongue every 5 (five) minutes as needed for chest pain.  . pantoprazole (PROTONIX) 40 MG tablet Take 40 mg by mouth 2 (two) times daily.     Allergies:   Patient has no known allergies.   Social History   Socioeconomic History  . Marital status: Married    Spouse name: Not on file  . Number of children: Not on file  . Years of education: Not on file  . Highest education level: Not on file  Occupational History  . Not on file  Social Needs  . Financial resource strain: Not on file  . Food insecurity:      Worry: Not on file    Inability: Not on file  . Transportation needs:    Medical: Not on file    Non-medical: Not on file  Tobacco Use  . Smoking status: Former Smoker    Packs/day: 1.00    Years: 10.00    Pack years: 10.00    Types: Cigarettes  . Smokeless tobacco: Never Used  . Tobacco comment: quit 1974  Substance and Sexual Activity  . Alcohol use: No  . Drug use: No  . Sexual activity: Not on file  Lifestyle  . Physical activity:    Days per week: Not on file    Minutes per session: Not on file  . Stress: Not on file  Relationships  . Social connections:    Talks on phone: Not on file    Gets together: Not on file    Attends religious service: Not on file    Active member of club or organization: Not on file    Attends meetings of clubs or organizations: Not on file    Relationship status: Not on file  Other Topics Concern  . Not on file  Social History Narrative  . Not on file     Family History: The patient's family history includes Heart attack in his father and mother; Heart disease in his father and mother. ROS:   Please see the history of present illness.    All 14 point review of systems negative except as described per history of present illness  EKGs/Labs/Other Studies Reviewed:      Recent Labs: No results found for requested labs within last 8760 hours.  Recent Lipid Panel No results found for: CHOL, TRIG, HDL, CHOLHDL, VLDL, LDLCALC, LDLDIRECT  Physical Exam:    VS:  BP 130/60   Pulse (!) 58   Ht 5\' 8"  (1.727 m)   Wt 190 lb (86.2 kg)   SpO2 97%   BMI 28.89 kg/m     Wt Readings from Last 3 Encounters:  01/12/19 190 lb (86.2 kg)  12/08/18 194 lb 6.4 oz (88.2 kg)  04/12/18 190 lb (86.2 kg)     GEN:  Well nourished, well developed in no acute distress HEENT: Normal NECK: No JVD; No carotid bruits LYMPHATICS: No lymphadenopathy CARDIAC: RRR, holosystolic murmur grade 2/6 to 3/6 best heard at the apex, no rubs, no  gallops RESPIRATORY:  Clear to auscultation without rales, wheezing or rhonchi  ABDOMEN: Soft, non-tender, non-distended MUSCULOSKELETAL:  No edema; No deformity  SKIN: Warm and dry LOWER EXTREMITIES: no swelling NEUROLOGIC:  Alert  and oriented x 3 PSYCHIATRIC:  Normal affect   ASSESSMENT:    1. Coronary artery disease involving native coronary artery of native heart without angina pectoris   2. Nonrheumatic mitral valve regurgitation   3. Obstructive sleep apnea   4. Dyspnea on exertion    PLAN:    In order of problems listed above:  1. Coronary artery disease status post recent cardiac catheterization which apparently showed nonobstructive lesions.  Will get copy of the report. 2. Nonrheumatic mitral regurgitation we will get echocardiogram to assess left ventricular ejection fraction and more importantly look at the mitral valve regurgitation. 3. Struct of sleep apnea.  Admit management to medicine team 4. With me on exertion Pap as outlined above   Medication Adjustments/Labs and Tests Ordered: Current medicines are reviewed at length with the patient today.  Concerns regarding medicines are outlined above.  No orders of the defined types were placed in this encounter.  Medication changes: No orders of the defined types were placed in this encounter.   Signed, Park Liter, MD, [facility] 01/12/2019 4:26 PM    [place]

## 2019-01-13 ENCOUNTER — Ambulatory Visit: Payer: Medicare Other | Admitting: Gastroenterology

## 2019-01-17 ENCOUNTER — Ambulatory Visit: Payer: Medicare Other | Admitting: Nurse Practitioner

## 2019-01-18 ENCOUNTER — Ambulatory Visit: Payer: Medicare Other | Admitting: Cardiology

## 2019-01-24 DIAGNOSIS — D1801 Hemangioma of skin and subcutaneous tissue: Secondary | ICD-10-CM | POA: Diagnosis not present

## 2019-01-24 DIAGNOSIS — L570 Actinic keratosis: Secondary | ICD-10-CM | POA: Diagnosis not present

## 2019-01-24 DIAGNOSIS — L821 Other seborrheic keratosis: Secondary | ICD-10-CM | POA: Diagnosis not present

## 2019-01-25 DIAGNOSIS — K219 Gastro-esophageal reflux disease without esophagitis: Secondary | ICD-10-CM | POA: Diagnosis not present

## 2019-01-26 ENCOUNTER — Ambulatory Visit (HOSPITAL_BASED_OUTPATIENT_CLINIC_OR_DEPARTMENT_OTHER)
Admission: RE | Admit: 2019-01-26 | Discharge: 2019-01-26 | Disposition: A | Discharge status: Home / Self Care | Payer: Medicare Other | Source: Ambulatory Visit | Attending: Cardiology | Admitting: Cardiology

## 2019-01-26 DIAGNOSIS — I340 Nonrheumatic mitral (valve) insufficiency: Secondary | ICD-10-CM | POA: Insufficient documentation

## 2019-01-26 NOTE — Progress Notes (Signed)
  Echocardiogram 2D Echocardiogram has been performed.  Ryan Mendoza T [name] 01/26/2019, 3:11 PM

## 2019-01-30 ENCOUNTER — Telehealth: Payer: Self-pay | Admitting: Cardiology

## 2019-01-30 NOTE — Telephone Encounter (Signed)
She has been informed  

## 2019-02-15 ENCOUNTER — Ambulatory Visit: Payer: Medicare Other | Admitting: Cardiology

## 2019-02-15 ENCOUNTER — Encounter: Payer: Self-pay | Admitting: Cardiology

## 2019-02-15 VITALS — BP 150/60 | HR 68 | Ht 68.0 in | Wt 189.2 lb

## 2019-02-15 DIAGNOSIS — I2510 Atherosclerotic heart disease of native coronary artery without angina pectoris: Secondary | ICD-10-CM

## 2019-02-15 DIAGNOSIS — G4730 Sleep apnea, unspecified: Secondary | ICD-10-CM

## 2019-02-15 DIAGNOSIS — R0609 Other forms of dyspnea: Secondary | ICD-10-CM

## 2019-02-15 DIAGNOSIS — I340 Nonrheumatic mitral (valve) insufficiency: Secondary | ICD-10-CM | POA: Diagnosis not present

## 2019-02-15 DIAGNOSIS — E210 Primary hyperparathyroidism: Secondary | ICD-10-CM | POA: Diagnosis not present

## 2019-02-15 MED ORDER — METOPROLOL SUCCINATE ER 25 MG PO TB24: 25.0000 mg | ORAL_TABLET | Freq: Every day | ORAL | 3 refills | Status: DC

## 2019-02-15 NOTE — Progress Notes (Signed)
Cardiology Office Note:    Date:  02/15/2019   ID:  Ryan Mendoza, DOB Dec 06, 1934, MRN [id number]  PCP:  Angelina Sheriff, MD  Cardiologist:  Jenne Campus, MD    Referring MD: Angelina Sheriff, MD   Chief Complaint  Patient presents with  . 1 month follow up  Feeling better but still not up to the park  History of Present Illness:    Ryan Mendoza is a 85 y.o. male with coronary artery disease recent cardiac catheterization showing no targets for intervention, moderate mitral regurgitation which appears to be nonrheumatic he comes today to my office for follow-up doing better but still complain of having weakness fatigue and shortness of breath.  He is scheduled to have gastroscopy by gastroenterologist and I think is an excellent idea.  He describes situation when he wakes up in the middle of the night his heart will be speeding up.  He does have diagnosis of obstructive sleep apnea in the chart.  Need to investigate this will be further.  Because that need to be well managed previously apparently he had some trial with a mask but was not able to tolerate it now with new equipment hopefully will be able to find something that will be useful for him.  Past Medical History:  Diagnosis Date  . Anxiety   . Aortic valve regurgitation   . Arthritis    hands and knees  . BPH (benign prostatic hyperplasia)   . Cataract, left eye   . Claustrophobia   . Coronary artery disease   . Depression    no current meds for  . Dyspnea    DOE  . Elevated cholesterol   . Gallstones   . GERD (gastroesophageal reflux disease)   . History of kidney stones   . Hypertension   . Hypothyroidism   . Myocardial infarction ([place])    SILENT  . Primary hyperparathyroidism ([place])   . 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 Tinnitus    both ears all the time    Past Surgical History:  Procedure Laterality Date  . CATARACT EXTRACTION W/PHACO Left  04/12/2018   Procedure: CATARACT EXTRACTION PHACO AND INTRAOCULAR LENS PLACEMENT (IOC);  Surgeon: Birder Robson, MD;  Location: [facility] ORS;  Service: Ophthalmology;  Laterality: Left;  [place] 00:39.5 AP% 16.7 CDE 6.62 Fluid Pack Lot # [id number]  . CORONARY ANGIOPLASTY     2017  . EYE SURGERY Right    ioc for cataract  . PARATHYROIDECTOMY Right 09/28/2017   Procedure: RIGHT INFERIOR PARATHYROIDECTOMY;  Surgeon: Armandina Gemma, MD;  Location: [facility];  Service: General;  Laterality: Right;  . right eye  plug for tear duct surgery    . stents to heart x 2  06/2016    Current Medications: Current Meds  Medication Sig  . acetaminophen (TYLENOL) 500 MG tablet Take 1,000 mg by mouth every 6 (six) hours as needed for moderate pain or headache.  Marland Kitchen amLODipine (NORVASC) 10 MG tablet Take 10 mg by mouth at bedtime.   Marland Kitchen aspirin EC 81 MG tablet Take 81 mg by mouth at bedtime.   Marland Kitchen atorvastatin (LIPITOR) 40 MG tablet Take 40 mg by mouth at bedtime.   . cloNIDine (CATAPRES) 0.1 MG tablet Take 0.1 mg by mouth 2 (two) times daily with a meal.   . levothyroxine (SYNTHROID, LEVOTHROID) 50 MCG tablet Take 50 mcg by mouth at bedtime.   Marland Kitchen  Multiple Vitamins-Minerals (CENTRUM SILVER PO) Take 1 tablet by mouth daily.  . nitroGLYCERIN (NITROSTAT) 0.4 MG SL tablet Place 1 tablet (0.4 mg total) under the tongue every 5 (five) minutes as needed for chest pain.     Allergies:   Patient has no known allergies.   Social History   Socioeconomic History  . Marital status: Married    Spouse name: Not on file  . Number of children: Not on file  . Years of education: Not on file  . Highest education level: Not on file  Occupational History  . Not on file  Social Needs  . Financial resource strain: Not on file  . Food insecurity:    Worry: Not on file    Inability: Not on file  . Transportation needs:    Medical: Not on file    Non-medical: Not on file  Tobacco Use  . Smoking status: Former Smoker    Packs/day:  1.00    Years: 10.00    Pack years: 10.00    Types: Cigarettes  . Smokeless tobacco: Never Used  . Tobacco comment: quit 1974  Substance and Sexual Activity  . Alcohol use: No  . Drug use: No  . Sexual activity: Not on file  Lifestyle  . Physical activity:    Days per week: Not on file    Minutes per session: Not on file  . Stress: Not on file  Relationships  . Social connections:    Talks on phone: Not on file    Gets together: Not on file    Attends religious service: Not on file    Active member of club or organization: Not on file    Attends meetings of clubs or organizations: Not on file    Relationship status: Not on file  Other Topics Concern  . Not on file  Social History Narrative  . Not on file     Family History: The patient's family history includes Heart attack in his father and mother; Heart disease in his father and mother. ROS:   Please see the history of present illness.    All 14 point review of systems negative except as described per history of present illness  EKGs/Labs/Other Studies Reviewed:      Recent Labs: No results found for requested labs within last 8760 hours.  Recent Lipid Panel No results found for: CHOL, TRIG, HDL, CHOLHDL, VLDL, LDLCALC, LDLDIRECT  Physical Exam:    VS:  BP (!) 150/60   Pulse 68   Ht 5\' 8"  (1.727 m)   Wt 189 lb 3.2 oz (85.8 kg)   SpO2 92%   BMI 28.77 kg/m     Wt Readings from Last 3 Encounters:  02/15/19 189 lb 3.2 oz (85.8 kg)  01/12/19 190 lb (86.2 kg)  12/08/18 194 lb 6.4 oz (88.2 kg)     GEN:  Well nourished, well developed in no acute distress HEENT: Normal NECK: No JVD; No carotid bruits LYMPHATICS: No lymphadenopathy CARDIAC: RRR, no murmurs, no rubs, no gallops RESPIRATORY:  Clear to auscultation without rales, wheezing or rhonchi  ABDOMEN: Soft, non-tender, non-distended MUSCULOSKELETAL:  No edema; No deformity  SKIN: Warm and dry LOWER EXTREMITIES: no swelling NEUROLOGIC:  Alert and  oriented x 3 PSYCHIATRIC:  Normal affect   ASSESSMENT:    1. Nonrheumatic mitral valve regurgitation   2. Coronary artery disease involving native coronary artery of native heart without angina pectoris   3. Hyperparathyroidism, primary ([place])   4. Dyspnea on  exertion    PLAN:    In order of problems listed above:  1. Nonrheumatic mitral valve regurgitation stable moderate.  We will continue monitoring. 2. Coronary artery disease I will do EKG today if EKG is fine I will try to add small dose of beta-blocker previously had some issue with her bradycardia hopefully this time will be able to give small dose of medication. 3. Dyspnea on exertion multifactorial.  So far things appears to be under control. 4. Hyperparathyroidism followed by primary care physician.   Medication Adjustments/Labs and Tests Ordered: Current medicines are reviewed at length with the patient today.  Concerns regarding medicines are outlined above.  No orders of the defined types were placed in this encounter.  Medication changes: No orders of the defined types were placed in this encounter.   Signed, Park Liter, MD, [facility] 02/15/2019 11:58 AM    [place]

## 2019-02-15 NOTE — Patient Instructions (Signed)
Medication Instructions:  Your physician has recommended you make the following change in your medication:  START taking metoprolol succinate 25 mg (1 tablet) daily   If you need a refill on your cardiac medications before your next appointment, please call your pharmacy.   Lab work: NONE If you have labs (blood work) drawn today and your tests are completely normal, you will receive your results only by: Marland Kitchen MyChart Message (if you have MyChart) OR . A paper copy in the mail If you have any lab test that is abnormal or we need to change your treatment, we will call you to review the results.  Testing/Procedures: An EKG was performed today.  Your physician has recommended that you have a sleep study. This test records several body functions during sleep, including: brain activity, eye movement, oxygen and carbon dioxide blood levels, heart rate and rhythm, breathing rate and rhythm, the flow of air through your mouth and nose, snoring, body muscle movements, and chest and belly movement.    Follow-Up: At [facility], you and your health needs are our priority.  As part of our continuing mission to provide you with exceptional heart care, we have created designated Provider Care Teams.  These Care Teams include your primary Cardiologist (physician) and Advanced Practice Providers (APPs -  Physician Assistants and Nurse Practitioners) who all work together to provide you with the care you need, when you need it. You will need a follow up appointment in 1 months.     Any Other Special Instructions Will Be Listed Below    Sleep Studies A sleep study (polysomnogram) is a series of tests done while you are sleeping. A sleep study records your brain waves, heart rate, breathing rate, oxygen level, and eye and leg movements. A sleep study helps your health care provider:  See how well you sleep.  Diagnose a sleep disorder.  Determine how severe your sleep disorder is.  Create a plan  to treat your sleep disorder. Your health care provider may recommend a sleep study if you:  Feel sleepy on most days.  Snore loudly while sleeping.  Have unusual behaviors while you sleep, such as walking.  Have brief periods in which you stop breathing during sleep (sleepapnea).  Fall asleep suddenly during the day (narcolepsy).  Have trouble falling asleep or staying asleep (insomnia).  Feel like you need to move your legs when trying to fall asleep (restless legs syndrome).  Move your legs by flexing and extending them regularly while asleep (periodic limb movement disorder).  Act out your dreams while you sleep (sleep behavior disorder).  Feel like you cannot move when you first wake up (sleep paralysis). What tests are part of a sleep study? Most sleep studies record the following during sleep:  Brain activity.  Eye movements.  Heart rate and rhythm.  Breathing rate and rhythm.  Blood-oxygen level.  Blood pressure.  Chest and belly movement as you breathe.  Arm and leg movements.  Snoring or other noises.  Body position. Where are sleep studies done? Sleep studies are done at sleep centers. A sleep center may be inside a hospital, office, or clinic. The room where you have the study may look like a hospital room or a hotel room. The health care providers doing the study may come in and out of the room during the study. Most of the time, they will be in another room monitoring your test as you sleep. How are sleep studies done? Most sleep studies  are done during a normal period of time for a full night of sleep. You will arrive at the study center in the evening and go home in the morning. Before the test  Bring your pajamas and toothbrush with you to the sleep study.  Do not have caffeine on the day of your sleep study.  Do not drink alcohol on the day of your sleep study.  Your health care provider will let you know if you should stop taking any of your  regular medicines before the test. During the test      Round, sticky patches with sensors attached to recording wires (electrodes) are placed on your scalp, face, chest, and limbs.  Wires from all the electrodes and sensors run from your bed to a computer. The wires can be taken off and put back on if you need to get out of bed to go to the bathroom.  A sensor is placed over your nose to measure airflow.  A finger clip is put on your finger or ear to measure your blood oxygen level (pulse oximetry).  A belt is placed around your belly and a belt is placed around your chest to measure breathing movements.  If you have signs of the sleep disorder called sleep apnea during your test, you may get a treatment mask to wear for the second half of the night. ? The mask provides positive airway pressure (PAP) to help you breathe better during sleep. This may greatly improve your sleep apnea. ? You will then have all tests done again with the mask in place to see if your measurements and recordings change. After the test  A medical doctor who specializes in sleep will evaluate the results of your sleep study and share them with you and your primary health care provider.  Based on your results, your medical history, and a physical exam, you may be diagnosed with a sleep disorder, such as: ? Sleep apnea. ? Restless legs syndrome. ? Sleep-related behavior disorder. ? Sleep-related movement disorders. ? Sleep-related seizure disorders.  Your health care team will help determine your treatment options based on your diagnosis. This may include: ? Improving your sleep habits (sleep hygiene). ? Wearing a continuous positive airway pressure (CPAP) or bi-level positive airway pressure (BPAP) mask. ? Wearing an oral device at night to improve breathing and reduce snoring. ? Taking medicines. Follow these instructions at home:  Take over-the-counter and prescription medicines only as told by your  health care provider.  If you are instructed to use a CPAP or BPAP mask, make sure you use it nightly as directed.  Make any lifestyle changes that your health care provider recommends.  If you were given a device to open your airway while you sleep, use it only as told by your health care provider.  Do not use any tobacco products, such as cigarettes, chewing tobacco, and e-cigarettes. If you need help quitting, ask your health care provider.  Keep all follow-up visits as told by your health care provider. This is important. Summary  A sleep study (polysomnogram) is a series of tests done while you are sleeping. It shows how well you sleep.  Most sleep studies are done over one full night of sleep. You will arrive at the study center in the evening and go home in the morning.  If you have signs of the sleep disorder called sleep apnea during your test, you may get a treatment mask to wear for the second half of  the night.  A medical doctor who specializes in sleep will evaluate the results of your sleep study and share them with your primary health care provider. This information is not intended to replace advice given to you by your health care provider. Make sure you discuss any questions you have with your health care provider. Document Released: 06/06/2003 Document Revised: 12/28/2017 Document Reviewed: 12/28/2017 Elsevier Interactive Patient Education  2019 Elsevier Inc. Metoprolol extended-release capsules What is this medicine? METOPROLOL (me TOE proe lole) is a beta-blocker. Beta-blockers reduce the workload on the heart and help it to beat more regularly. This medicine is used to treat high blood pressure and to prevent chest pain. It is also used after a heart attack to prevent an additional heart attack from occurring. This medicine may be used for other purposes; ask your health care provider or pharmacist if you have questions. COMMON BRAND NAME(S): KAPSPARGO What should I  tell my health care provider before I take this medicine? They need to know if you have any of these conditions: -diabetes -heart disease -liver disease -lung or breathing disease, like asthma -pheochromocytoma -thyroid disease -an unusual or allergic reaction to metoprolol, other beta-blockers, medicines, foods, dyes, or preservatives -pregnant or trying to get pregnant -breast-feeding How should I use this medicine? Take this medicine by mouth. The capsules can be swallowed whole or opened carefully and the contents sprinkled over a small amount (teaspoonful) of soft food, such as applesauce, pudding, or yogurt. This mixture must be swallowed within 60 minutes and not stored for future use. Do not chew this medicine. You can take it with or without food. If it upsets your stomach, take it with food. Take your medicine at regular intervals. Do not take it more often than directed. Do not stop taking except on your doctor's advice. Talk to your pediatrician regarding the use of this medicine in children. While this drug may be prescribed for children as young as 6 for selected conditions, precautions do apply. Overdosage: If you think you have taken too much of this medicine contact a poison control center or emergency room at once. NOTE: This medicine is only for you. Do not share this medicine with others. What if I miss a dose? If you miss a dose, take it as soon as you can. If it is almost time for your next dose, take only that dose. Do not take double or extra doses. What may interact with this medicine? This medicine may interact with the following medications: -certain medicines for blood pressure, heart disease, irregular heart beat -epinephrine -fluoxetine -MAOIs like Carbex, Eldepryl, Marplan, Nardil, and Parnate -paroxetine -reserpine This list may not describe all possible interactions. Give your health care provider a list of all the medicines, herbs, non-prescription drugs,  or dietary supplements you use. Also tell them if you smoke, drink alcohol, or use illegal drugs. Some items may interact with your medicine. What should I watch for while using this medicine? You may get drowsy or dizzy. Do not drive, use machinery, or do anything that needs mental alertness until you know how this medicine affects you. Do not stand or sit up quickly, especially if you are an older patient. This reduces the risk of dizzy or fainting spells. Alcohol may interfere with the effect of this medicine. Avoid alcoholic drinks. Visit your doctor or health care professional for regular checks on your progress. Check your blood pressure as directed. Ask your doctor or health care professional what your blood pressure should  be and when you should contact him or her. Do not treat yourself for coughs, colds, or pain while you are using this medicine without asking your doctor or health care professional for advice. Some ingredients may increase your blood pressure. What side effects may I notice from receiving this medicine? Side effects that you should report to your doctor or health care professional as soon as possible: -allergic reactions like skin rash, itching or hives, swelling of the face, lips, or tongue -cold hands or feet -signs and symptoms of low blood pressure like dizziness; feeling faint or lightheaded, falls; unusually weak or tired -signs of worsening heart failure like breathing problems, swelling in your legs and feet -suicidal thoughts or other mood changes -unusually slow heartbeat Side effects that usually do not require medical attention (report these to your doctor or health care professional if they continue or are bothersome): -anxious -change in sex drive or performance -diarrhea -headache -trouble sleeping -tiredness -upset stomach This list may not describe all possible side effects. Call your doctor for medical advice about side effects. You may report side  effects to FDA at 1-800-FDA-1088. Where should I keep my medicine? Keep out of the reach of children. Store at room temperature between 15 and 30 degrees C (59 and 86 degrees F). Throw away any unused medicine after the expiration date. NOTE: This sheet is a summary. It may not cover all possible information. If you have questions about this medicine, talk to your doctor, pharmacist, or health care provider.  2019 Elsevier/Gold Standard (2017-06-23 09:19:37)

## 2019-02-16 ENCOUNTER — Telehealth: Payer: Self-pay

## 2019-02-16 ENCOUNTER — Telehealth: Payer: Self-pay | Admitting: *Deleted

## 2019-02-16 NOTE — Telephone Encounter (Signed)
Patient has been scheduled for 02/26/19 at [place] for sleep study. RN called and informed him of timing. Also advised patient that packet should come in mail with all important details. No further questions at this time.

## 2019-02-16 NOTE — Telephone Encounter (Signed)
Left vm to call back and schedule patients sleep study

## 2019-02-16 NOTE — Telephone Encounter (Signed)
-----   Message from Lauralee Evener, [place] sent at 02/16/2019  9:59 AM EST ----- Regarding: RE: Please pre-cert Per [facility] web portal no PA is required. Ok to schedule. Call [phone] ----- Message ----- From: Beckey Rutter, RN Sent: 02/15/2019   2:35 PM EST To: [facility] Div Sleep Studies Subject: Please pre-cert                                Please pre cert:   For sleep studies  ICD: G47.33 [facility]

## 2019-02-16 NOTE — Telephone Encounter (Signed)
Staff message sent to [facility] per [facility] web portal no PA is required. Ok to schedule sleep study. [employer] decision #:[id number].

## 2019-02-16 NOTE — Telephone Encounter (Signed)
-----   Message from Beckey Rutter, RN sent at 02/15/2019  2:35 PM EST ----- Regarding: Please pre-cert Please pre cert:   For sleep studies  ICD: G47.33 [facility]

## 2019-02-21 ENCOUNTER — Telehealth: Payer: Self-pay | Admitting: Cardiology

## 2019-02-21 DIAGNOSIS — Z79899 Other long term (current) drug therapy: Secondary | ICD-10-CM | POA: Diagnosis not present

## 2019-02-21 DIAGNOSIS — K219 Gastro-esophageal reflux disease without esophagitis: Secondary | ICD-10-CM | POA: Diagnosis not present

## 2019-02-21 DIAGNOSIS — I252 Old myocardial infarction: Secondary | ICD-10-CM | POA: Diagnosis not present

## 2019-02-21 DIAGNOSIS — K210 Gastro-esophageal reflux disease with esophagitis: Secondary | ICD-10-CM | POA: Diagnosis not present

## 2019-02-21 DIAGNOSIS — K760 Fatty (change of) liver, not elsewhere classified: Secondary | ICD-10-CM | POA: Diagnosis not present

## 2019-02-21 DIAGNOSIS — I2510 Atherosclerotic heart disease of native coronary artery without angina pectoris: Secondary | ICD-10-CM | POA: Diagnosis not present

## 2019-02-21 DIAGNOSIS — E039 Hypothyroidism, unspecified: Secondary | ICD-10-CM | POA: Diagnosis not present

## 2019-02-21 DIAGNOSIS — R079 Chest pain, unspecified: Secondary | ICD-10-CM | POA: Diagnosis not present

## 2019-02-21 DIAGNOSIS — I10 Essential (primary) hypertension: Secondary | ICD-10-CM | POA: Diagnosis not present

## 2019-02-21 DIAGNOSIS — K209 Esophagitis, unspecified: Secondary | ICD-10-CM | POA: Diagnosis not present

## 2019-02-21 DIAGNOSIS — E7800 Pure hypercholesterolemia, unspecified: Secondary | ICD-10-CM | POA: Diagnosis not present

## 2019-02-21 DIAGNOSIS — Z955 Presence of coronary angioplasty implant and graft: Secondary | ICD-10-CM | POA: Diagnosis not present

## 2019-02-21 DIAGNOSIS — K449 Diaphragmatic hernia without obstruction or gangrene: Secondary | ICD-10-CM | POA: Diagnosis not present

## 2019-02-21 NOTE — Telephone Encounter (Signed)
Please call daughter regarding medications.

## 2019-02-22 NOTE — Telephone Encounter (Signed)
Left message for patients daughter to return call.

## 2019-02-24 ENCOUNTER — Telehealth: Payer: Self-pay | Admitting: Cardiology

## 2019-02-24 NOTE — Telephone Encounter (Addendum)
Patient daughter informed that per Dr. Agustin Cree it isn't necessary to cancel this appointment. She already cancelled it because patient is worried. She will reschedule. Patient's daughter denies any other needs at this time.

## 2019-02-24 NOTE — Telephone Encounter (Signed)
Patient is scheduled for a sleep study this weekend, can this be rescheduled? Patient is hesitant to go

## 2019-02-26 ENCOUNTER — Ambulatory Visit (HOSPITAL_BASED_OUTPATIENT_CLINIC_OR_DEPARTMENT_OTHER): Payer: Medicare Other | Attending: Cardiology

## 2019-03-02 NOTE — Telephone Encounter (Signed)
Please see following phone encounter.

## 2019-03-14 ENCOUNTER — Telehealth: Payer: Self-pay | Admitting: Cardiology

## 2019-03-14 NOTE — Telephone Encounter (Signed)
Patient agreed.  Virtual Visit Pre-Appointment Phone Call  Steps For Call:  1. Confirm consent - "In the setting of the current Covid19 crisis, you are scheduled for a (phone or video) visit with your provider on (date) at (time).  Just as we do with many in-office visits, in order for you to participate in this visit, we must obtain consent.  If you'd like, I can send this to your mychart (if signed up) or email for you to review.  Otherwise, I can obtain your verbal consent now.  All virtual visits are billed to your insurance company just like a normal visit would be.  By agreeing to a virtual visit, we'd like you to understand that the technology does not allow for your provider to perform an examination, and thus may limit your provider's ability to fully assess your condition.  Finally, though the technology is pretty good, we cannot assure that it will always work on either your or our end, and in the setting of a video visit, we may have to convert it to a phone-only visit.  In either situation, we cannot ensure that we have a secure connection.  Are you willing to proceed?"  2. Give patient instructions for WebEx download to smartphone as below if video visit  3. Advise patient to be prepared with any vital sign or heart rhythm information, their current medicines, and a piece of paper and pen handy for any instructions they may receive the day of their visit  4. Inform patient they will receive a phone call 15 minutes prior to their appointment time (may be from unknown caller ID) so they should be prepared to answer  5. Confirm that appointment type is correct in Epic appointment notes (video vs telephone)    TELEPHONE CALL NOTE  KAINOA SWOBODA has been deemed a candidate for a follow-up tele-health visit to limit community exposure during the Covid-19 pandemic. I spoke with the patient via phone to ensure availability of phone/video source, confirm preferred email & phone number, and  discuss instructions and expectations.  I reminded [name] to be prepared with any vital sign and/or heart rhythm information that could potentially be obtained via home monitoring, at the time of his visit. I reminded [name] to expect a phone call at the time of his visit if his visit.  Did the patient verbally acknowledge consent to treatment? YES  Frederic Jericho 03/14/2019 4:34 PM   DOWNLOADING THE [facility], go to [employer] and type in WebEx in the search bar. [place] [employer], the blue/green circle. The app is free but as with any other app downloads, their phone may require them to verify saved payment information or Apple password. The patient does NOT have to create an account.  - If Android, ask patient to go to [employer] and type in WebEx in the search bar. [place] [employer], the blue/green circle. The app is free but as with any other app downloads, their phone may require them to verify saved payment information or Android password. The patient does NOT have to create an account.   CONSENT FOR TELE-HEALTH VISIT - PLEASE REVIEW  I hereby voluntarily request, consent and authorize [facility] and its employed or contracted physicians, physician assistants, nurse practitioners or other licensed health care professionals (the Practitioner), to provide me with telemedicine health care services (the Services") as deemed necessary by the treating Practitioner.  I acknowledge and consent to receive the Services by the Practitioner via telemedicine. I understand that the telemedicine visit will involve communicating with the Practitioner through live audiovisual communication technology and the disclosure of certain medical information by electronic transmission. I acknowledge that I have been given the opportunity to request an in-person assessment or other available alternative prior to the telemedicine visit and am  voluntarily participating in the telemedicine visit.  I understand that I have the right to withhold or withdraw my consent to the use of telemedicine in the course of my care at any time, without affecting my right to future care or treatment, and that the Practitioner or I may terminate the telemedicine visit at any time. I understand that I have the right to inspect all information obtained and/or recorded in the course of the telemedicine visit and may receive copies of available information for a reasonable fee.  I understand that some of the potential risks of receiving the Services via telemedicine include:   Delay or interruption in medical evaluation due to technological equipment failure or disruption;  Information transmitted may not be sufficient (e.g. poor resolution of images) to allow for appropriate medical decision making by the Practitioner; and/or   In rare instances, security protocols could fail, causing a breach of personal health information.  Furthermore, I acknowledge that it is my responsibility to provide information about my medical history, conditions and care that is complete and accurate to the best of my ability. I acknowledge that Practitioner's advice, recommendations, and/or decision may be based on factors not within their control, such as incomplete or inaccurate data provided by me or distortions of diagnostic images or specimens that may result from electronic transmissions. I understand that the practice of medicine is not an exact science and that Practitioner makes no warranties or guarantees regarding treatment outcomes. I acknowledge that I will receive a copy of this consent concurrently upon execution via email to the email address I last provided but may also request a printed copy by calling the office of [facility].    I understand that my insurance will be billed for this visit.   I have read or had this consent read to me.  I understand the  contents of this consent, which adequately explains the benefits and risks of the Services being provided via telemedicine.   I have been provided ample opportunity to ask questions regarding this consent and the Services and have had my questions answered to my satisfaction.  I give my informed consent for the services to be provided through the use of telemedicine in my medical care  By participating in this telemedicine visit I agree to the above.

## 2019-03-16 ENCOUNTER — Other Ambulatory Visit: Payer: Self-pay

## 2019-03-16 ENCOUNTER — Encounter: Payer: Self-pay | Admitting: Cardiology

## 2019-03-16 ENCOUNTER — Telehealth (INDEPENDENT_AMBULATORY_CARE_PROVIDER_SITE_OTHER): Payer: Medicare Other | Admitting: Cardiology

## 2019-03-16 VITALS — BP 147/64 | Wt 188.0 lb

## 2019-03-16 DIAGNOSIS — E210 Primary hyperparathyroidism: Secondary | ICD-10-CM | POA: Diagnosis not present

## 2019-03-16 DIAGNOSIS — I2510 Atherosclerotic heart disease of native coronary artery without angina pectoris: Secondary | ICD-10-CM

## 2019-03-16 DIAGNOSIS — R0609 Other forms of dyspnea: Secondary | ICD-10-CM

## 2019-03-16 DIAGNOSIS — I340 Nonrheumatic mitral (valve) insufficiency: Secondary | ICD-10-CM

## 2019-03-16 DIAGNOSIS — F419 Anxiety disorder, unspecified: Secondary | ICD-10-CM | POA: Insufficient documentation

## 2019-03-16 NOTE — Patient Instructions (Signed)
Medication Instructions:  Your physician recommends that you continue on your current medications as directed. Please refer to the Current Medication list given to you today.  If you need a refill on your cardiac medications before your next appointment, please call your pharmacy.   Lab work: None.  If you have labs (blood work) drawn today and your tests are completely normal, you will receive your results only by: . MyChart Message (if you have MyChart) OR . A paper copy in the mail If you have any lab test that is abnormal or we need to change your treatment, we will call you to review the results.  Testing/Procedures: None.   Follow-Up: At [facility], you and your health needs are our priority.  As part of our continuing mission to provide you with exceptional heart care, we have created designated Provider Care Teams.  These Care Teams include your primary Cardiologist (physician) and Advanced Practice Providers (APPs -  Physician Assistants and Nurse Practitioners) who all work together to provide you with the care you need, when you need it. You will need a follow up appointment in 2 months.  Please call our office 2 months in advance to schedule this appointment.  You may see No primary care provider on file. or another member of our [facility] Provider Team in [place]: Brian Munley, MD . Rajan Revankar, MD  Any Other Special Instructions Will Be Listed Below (If Applicable).     

## 2019-03-16 NOTE — Progress Notes (Signed)
Virtual Visit via Video Note    Evaluation Performed:  Follow-up visit  This visit type was conducted due to national recommendations for restrictions regarding the COVID-19 Pandemic (e.g. social distancing).  This format is felt to be most appropriate for this patient at this time.  All issues noted in this document were discussed and addressed.  No physical exam was performed (except for noted visual exam findings with Video Visits).  Please refer to the patient's chart (MyChart message for video visits and phone note for telephone visits) for the patient's consent to telehealth for [facility].  Date:  03/16/2019  ID: Ryan Mendoza, DOB 16-Jan-1934, MRN [id number]   Patient Location:  [address] [address]   Provider location:   [place] Office  PCP:  Ryan Sheriff, MD  Cardiologist:  Ryan Campus, MD     Chief Complaint: I am doing well but I am very anxious  History of Present Illness:    Ryan Mendoza is a 85 y.o. male  who presents via audio/video conferencing for a telehealth visit today.  Overall cardiac wise doing well denies have any chest pain tightness squeezing pressure burning chest.  Shortness of breath this is usual he said he can walk for about 15 minutes that he have to rest before continue walking this is nothing new nothing unusual for him.  The biggest complaint he got today is the fact that he is very stressed out about coronavirus situation.  He said every other night he is so anxious that he cannot sleep at all he have to go outside and walk in order to calm himself down he knows about relaxation technique he knows about the fact that exercise help with that but still he cannot manage that.  He is asking if I can give him some anxiolytic.  He used to be taking Zoloft years ago he said he took it on the as-needed basis which seems to be helping a lot.   The patient does not have symptoms concerning for COVID-19 infection (fever,  chills, cough, or new SHORTNESS OF BREATH).    Prior CV studies:   The following studies were reviewed today:       Past Medical History:  Diagnosis Date  . Anxiety   . Aortic valve regurgitation   . Arthritis    hands and knees  . BPH (benign prostatic hyperplasia)   . Cataract, left eye   . Claustrophobia   . Coronary artery disease   . Depression    no current meds for  . Dyspnea    DOE  . Elevated cholesterol   . Gallstones   . GERD (gastroesophageal reflux disease)   . History of kidney stones   . Hypertension   . Hypothyroidism   . Myocardial infarction ([place])    SILENT  . Primary hyperparathyroidism ([place])   . 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 Tinnitus    both ears all the time    Past Surgical History:  Procedure Laterality Date  . CATARACT EXTRACTION W/PHACO Left 04/12/2018   Procedure: CATARACT EXTRACTION PHACO AND INTRAOCULAR LENS PLACEMENT (IOC);  Surgeon: Birder Robson, MD;  Location: [facility] ORS;  Service: Ophthalmology;  Laterality: Left;  [place] 00:39.5 AP% 16.7 CDE 6.62 Fluid Pack Lot # [id number]  . CORONARY ANGIOPLASTY     2017  . EYE SURGERY Right    ioc for  cataract  . PARATHYROIDECTOMY Right 09/28/2017   Procedure: RIGHT INFERIOR PARATHYROIDECTOMY;  Surgeon: Armandina Gemma, MD;  Location: [facility]S;  Service: General;  Laterality: Right;  . right eye  plug for tear duct surgery    . stents to heart x 2  06/2016     Current Meds  Medication Sig  . acetaminophen (TYLENOL) 500 MG tablet Take 1,000 mg by mouth every 6 (six) hours as needed for moderate pain or headache.  Marland Kitchen amLODipine (NORVASC) 10 MG tablet Take 10 mg by mouth at bedtime.   Marland Kitchen aspirin EC 81 MG tablet Take 81 mg by mouth at bedtime.   Marland Kitchen atorvastatin (LIPITOR) 40 MG tablet Take 40 mg by mouth at bedtime.   . cloNIDine (CATAPRES) 0.1 MG tablet Take 0.1 mg by mouth 2 (two) times daily with a meal.   . famotidine (PEPCID) 40 MG tablet  Take 1 tablet by mouth 2 (two) times daily.  Marland Kitchen levothyroxine (SYNTHROID, LEVOTHROID) 50 MCG tablet Take 50 mcg by mouth at bedtime.   . metoprolol succinate (TOPROL XL) 25 MG 24 hr tablet Take 1 tablet (25 mg total) by mouth daily.  . Multiple Vitamins-Minerals (CENTRUM SILVER PO) Take 1 tablet by mouth daily.  . nitroGLYCERIN (NITROSTAT) 0.4 MG SL tablet Place 1 tablet (0.4 mg total) under the tongue every 5 (five) minutes as needed for chest pain.      Family History: The patient's family history includes Heart attack in his father and mother; Heart disease in his father and mother.   ROS:   Please see the history of present illness.     All other systems reviewed and are negative.   Labs/Other Tests and Data Reviewed:     Recent Labs: No results found for requested labs within last 8760 hours.  Recent Lipid Panel No results found for: CHOL, TRIG, HDL, CHOLHDL, VLDL, LDLCALC, LDLDIRECT    Exam:    Vital Signs:  BP (!) 147/64   Wt 188 lb (85.3 kg)   BMI 28.59 kg/m     Wt Readings from Last 3 Encounters:  03/16/19 188 lb (85.3 kg)  02/15/19 189 lb 3.2 oz (85.8 kg)  01/12/19 190 lb (86.2 kg)     Well nourished, well developed male in no acute distress. Awake alert x3.  There is no JVD there is no swelling of lower extremities his mood is appropriate and he is actually quite cheerful during the conversation  Diagnosis for this visit:   1. Coronary artery disease involving native coronary artery of native heart without angina pectoris   2. Nonrheumatic mitral valve regurgitation   3. Dyspnea on exertion   4. Hyperparathyroidism, primary ([place])   5. Anxiety      ASSESSMENT & PLAN:    1.  Artery disease asymptomatic on appropriate medication no chest pain tightness squeezing pressure burning chest. 2.  Nonrheumatic mitral valve regurgitation.  Stable no swelling of lower extremities no proximal nocturnal dyspnea overall doing well from that point review. 3.   Dyspnea on exertion as usual continue present management. 4.  Hyper Parathyroidism that being followed by internal medicine team. 5.  Anxiety.  I spoke to his primary care physician and we decided to send some prescription for Xanax for him.  COVID-19 Education: The signs and symptoms of COVID-19 were discussed with the patient and how to seek care for testing (follow up with PCP or arrange E-visit).  The importance of social distancing was discussed today.  Patient Risk:  After full review of this patients clinical status, I feel that they are at least moderate risk at this time.  Time:   Today, I have spent 17 minutes with the patient with telehealth technology discussing pt health issues. Visit was finished at 340.    Medication Adjustments/Labs and Tests Ordered: Current medicines are reviewed at length with the patient today.  Concerns regarding medicines are outlined above.  No orders of the defined types were placed in this encounter.  Medication changes: No orders of the defined types were placed in this encounter.    Disposition: Follow-up with me in 2 to 3 months.  Signed, Park Liter, MD, [facility] 03/16/2019 3:33 PM    [place]

## 2019-03-21 ENCOUNTER — Telehealth: Payer: Medicare Other | Admitting: Cardiology

## 2019-04-20 ENCOUNTER — Telehealth: Payer: Self-pay | Admitting: Cardiology

## 2019-04-20 NOTE — Telephone Encounter (Signed)
Left message for patient to return call.

## 2019-04-20 NOTE — Telephone Encounter (Signed)
Ryan Mendoza, patient 's daughter called to report patient's BP and HR which are 138/68, HR 57. She askedthat Ipass this information on to [facility]

## 2019-04-20 NOTE — Telephone Encounter (Signed)
Patient's daughter called back she reports the patients heart rate and blood pressure have been dropping for the past couple days his readings today were blood pressure 109/60 and heart rate 40. Patient feels weak and tired most of the day. No other complaints. Will consult with Dr. Agustin Cree and follow back up with patient's daughter.

## 2019-04-20 NOTE — Telephone Encounter (Signed)
Called patient's daughter back and informed her that Dr. Agustin Cree will advise for patient to just monitor this for now and call us if things get worse or do not improve. Patient's daughter verbally understands.

## 2019-04-20 NOTE — Telephone Encounter (Signed)
Patient's daughter Jocelyn Lamer called to let doctor know her fathers BP is running 109/60 with heartrate of 40. Please advise at [phone]

## 2019-05-15 DIAGNOSIS — K219 Gastro-esophageal reflux disease without esophagitis: Secondary | ICD-10-CM | POA: Diagnosis not present

## 2019-05-17 ENCOUNTER — Telehealth (INDEPENDENT_AMBULATORY_CARE_PROVIDER_SITE_OTHER): Payer: Medicare Other | Admitting: Cardiology

## 2019-05-17 ENCOUNTER — Encounter: Payer: Self-pay | Admitting: Cardiology

## 2019-05-17 ENCOUNTER — Other Ambulatory Visit: Payer: Self-pay

## 2019-05-17 VITALS — BP 139/68 | HR 56

## 2019-05-17 DIAGNOSIS — F419 Anxiety disorder, unspecified: Secondary | ICD-10-CM

## 2019-05-17 DIAGNOSIS — I340 Nonrheumatic mitral (valve) insufficiency: Secondary | ICD-10-CM | POA: Diagnosis not present

## 2019-05-17 DIAGNOSIS — I2510 Atherosclerotic heart disease of native coronary artery without angina pectoris: Secondary | ICD-10-CM

## 2019-05-17 DIAGNOSIS — R0609 Other forms of dyspnea: Secondary | ICD-10-CM

## 2019-05-17 NOTE — Patient Instructions (Signed)
Medication Instructions:  Your physician recommends that you continue on your current medications as directed. Please refer to the Current Medication list given to you today.  If you need a refill on your cardiac medications before your next appointment, please call your pharmacy.   Lab work: None.  If you have labs (blood work) drawn today and your tests are completely normal, you will receive your results only by: Marland Kitchen MyChart Message (if you have MyChart) OR . A paper copy in the mail If you have any lab test that is abnormal or we need to change your treatment, we will call you to review the results.  Testing/Procedures: Your physician has recommended that you wear a holter monitor. Holter monitors are medical devices that record the heart's electrical activity. Doctors most often use these monitors to diagnose arrhythmias. Arrhythmias are problems with the speed or rhythm of the heartbeat. The monitor is a small, portable device. You can wear one while you do your normal daily activities. This is usually used to diagnose what is causing palpitations/syncope (passing out). Wear for 7 days.     Follow-Up: At [facility], you and your health needs are our priority.  As part of our continuing mission to provide you with exceptional heart care, we have created designated Provider Care Teams.  These Care Teams include your primary Cardiologist (physician) and Advanced Practice Providers (APPs -  Physician Assistants and Nurse Practitioners) who all work together to provide you with the care you need, when you need it. You will need a follow up appointment in 1 months.  Please call our office 2 months in advance to schedule this appointment.  You may see No primary care provider on file. or another member of our [employer] Provider Team in [place]: Shirlee More, MD . Jyl Heinz, MD  Any Other Special Instructions Will Be Listed Below (If Applicable).

## 2019-05-17 NOTE — Progress Notes (Signed)
Virtual Visit via Telephone Note   This visit type was conducted due to national recommendations for restrictions regarding the COVID-19 Pandemic (e.g. social distancing) in an effort to limit this patient's exposure and mitigate transmission in our community.  Due to his co-morbid illnesses, this patient is at least at moderate risk for complications without adequate follow up.  This format is felt to be most appropriate for this patient at this time.  The patient did not have access to video technology/had technical difficulties with video requiring transitioning to audio format only (telephone).  All issues noted in this document were discussed and addressed.  No physical exam could be performed with this format.  Please refer to the patient's chart for his  consent to telehealth for [facility].  Evaluation Performed:  Follow-up visit  This visit type was conducted due to national recommendations for restrictions regarding the COVID-19 Pandemic (e.g. social distancing).  This format is felt to be most appropriate for this patient at this time.  All issues noted in this document were discussed and addressed.  No physical exam was performed (except for noted visual exam findings with Video Visits).  Please refer to the patient's chart (MyChart message for video visits and phone note for telephone visits) for the patient's consent to telehealth for [facility].  Date:  05/17/2019  ID: Ryan Mendoza, DOB 1934-10-05, MRN [id number]   Patient Location: [address] [place] [address]   Provider location:   [place] Office  PCP:  Angelina Sheriff, MD  Cardiologist:  Jenne Campus, MD     Chief Complaint: I am doing well  History of Present Illness:    Ryan Mendoza is a 85 y.o. male  who presents via audio/video conferencing for a telehealth visit today.  With past medical history significant for coronary artery disease, also nonrheumatic mitral valve regurgitation  which is not critical enough to intervene, dyspnea on exertion and calling today to find about his condition.  He is not able to establish video link therefore we have only telephone conversation.  He brought to issue to my attention issue #1 is the fact that he got episodes of his blood pressure going low.  He said he feels very weak he check his heart rate and blood pressure heart rate is 40 and blood pressure is low 35-46 systolic that happened a few times already.  Another issue he complained of having swollen foot.  And its only up to ankle level.  There is no pretibial edema.  He said skin is a little red.  But he said he does not look inflamed.  I will look at this like when I bring him to the office to have monitor.   The patient does not have symptoms concerning for COVID-19 infection (fever, chills, cough, or new SHORTNESS OF BREATH).    Prior CV studies:   The following studies were reviewed today:       Past Medical History:  Diagnosis Date  . Anxiety   . Aortic valve regurgitation   . Arthritis    hands and knees  . BPH (benign prostatic hyperplasia)   . Cataract, left eye   . Claustrophobia   . Coronary artery disease   . Depression    no current meds for  . Dyspnea    DOE  . Elevated cholesterol   . Gallstones   . GERD (gastroesophageal reflux disease)   . History of kidney stones   .  Hypertension   . Hypothyroidism   . Myocardial infarction ([place])    SILENT  . Primary hyperparathyroidism ([place])   . 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 Tinnitus    both ears all the time    Past Surgical History:  Procedure Laterality Date  . CATARACT EXTRACTION W/PHACO Left 04/12/2018   Procedure: CATARACT EXTRACTION PHACO AND INTRAOCULAR LENS PLACEMENT (IOC);  Surgeon: Birder Robson, MD;  Location: [facility] ORS;  Service: Ophthalmology;  Laterality: Left;  [place] 00:39.5 AP% 16.7 CDE 6.62 Fluid Pack Lot # [id number]  . CORONARY  ANGIOPLASTY     2017  . EYE SURGERY Right    ioc for cataract  . PARATHYROIDECTOMY Right 09/28/2017   Procedure: RIGHT INFERIOR PARATHYROIDECTOMY;  Surgeon: Armandina Gemma, MD;  Location: [facility] ORS;  Service: General;  Laterality: Right;  . right eye  plug for tear duct surgery    . stents to heart x 2  06/2016     Current Meds  Medication Sig  . acetaminophen (TYLENOL) 500 MG tablet Take 1,000 mg by mouth every 6 (six) hours as needed for moderate pain or headache.  . ALPRAZolam (XANAX) 0.25 MG tablet Take 1 tablet by mouth at bedtime.  Marland Kitchen amLODipine (NORVASC) 10 MG tablet Take 10 mg by mouth at bedtime.   Marland Kitchen aspirin EC 81 MG tablet Take 81 mg by mouth at bedtime.   Marland Kitchen atorvastatin (LIPITOR) 40 MG tablet Take 40 mg by mouth at bedtime.   . cloNIDine (CATAPRES) 0.1 MG tablet Take 0.1 mg by mouth 2 (two) times daily with a meal.   . famotidine (PEPCID) 40 MG tablet Take 1 tablet by mouth 2 (two) times daily.  Marland Kitchen levothyroxine (SYNTHROID, LEVOTHROID) 50 MCG tablet Take 50 mcg by mouth at bedtime.   . metoprolol succinate (TOPROL XL) 25 MG 24 hr tablet Take 1 tablet (25 mg total) by mouth daily.  . Multiple Vitamins-Minerals (CENTRUM SILVER PO) Take 1 tablet by mouth daily.  . nitroGLYCERIN (NITROSTAT) 0.4 MG SL tablet Place 1 tablet (0.4 mg total) under the tongue every 5 (five) minutes as needed for chest pain.  . pantoprazole (PROTONIX) 40 MG tablet Take 40 mg by mouth 2 (two) times daily.      Family History: The patient's family history includes Heart attack in his father and mother; Heart disease in his father and mother.   ROS:   Please see the history of present illness.     All other systems reviewed and are negative.   Labs/Other Tests and Data Reviewed:     Recent Labs: No results found for requested labs within last 8760 hours.  Recent Lipid Panel No results found for: CHOL, TRIG, HDL, CHOLHDL, VLDL, LDLCALC, LDLDIRECT    Exam:    Vital Signs:  BP 139/68   Pulse (!)  56     Wt Readings from Last 3 Encounters:  03/16/19 188 lb (85.3 kg)  02/15/19 189 lb 3.2 oz (85.8 kg)  01/12/19 190 lb (86.2 kg)     Well nourished, well developed in no acute distress. Alert awake oriented x3 happy to be able to talk to me via video link without any distress at the month my interview  Diagnosis for this visit:   1. Nonrheumatic mitral valve regurgitation   2. Coronary artery disease involving native coronary artery of native heart without angina pectoris   3. Dyspnea on exertion   4. Anxiety  ASSESSMENT & PLAN:    1.  Nonrheumatic mitral valve regurgitant not critical we will continue present management. 2.  Coronary artery disease asymptomatic we will continue present management 3.  Dyspnea on exertion stable 4.  Episode of hypotension will put monitor on him to check and see if he got any significant bradycardia.  When he come for monitor will also look at his foot  COVID-19 Education: The signs and symptoms of COVID-19 were discussed with the patient and how to seek care for testing (follow up with PCP or arrange E-visit).  The importance of social distancing was discussed today.  Patient Risk:   After full review of this patients clinical status, I feel that they are at least moderate risk at this time.  Time:   Today, I have spent 18 minutes with the patient with telehealth technology discussing pt health issues.  I spent 5 minutes reviewing her chart before the visit.  Visit was finished at 10:30 AM.    Medication Adjustments/Labs and Tests Ordered: Current medicines are reviewed at length with the patient today.  Concerns regarding medicines are outlined above.  Orders Placed This Encounter  Procedures  . LONG TERM MONITOR (3-14 DAYS)   Medication changes: No orders of the defined types were placed in this encounter.    Disposition: Follow-up 1 month  Signed, Park Liter, MD, [facility] 05/17/2019 11:01 AM    [place]

## 2019-05-18 ENCOUNTER — Ambulatory Visit (INDEPENDENT_AMBULATORY_CARE_PROVIDER_SITE_OTHER): Payer: Medicare Other | Admitting: Cardiology

## 2019-05-18 ENCOUNTER — Encounter: Payer: Self-pay | Admitting: Cardiology

## 2019-05-18 ENCOUNTER — Other Ambulatory Visit (INDEPENDENT_AMBULATORY_CARE_PROVIDER_SITE_OTHER): Payer: Medicare Other

## 2019-05-18 VITALS — BP 130/62 | HR 56 | Wt 195.8 lb

## 2019-05-18 DIAGNOSIS — G4733 Obstructive sleep apnea (adult) (pediatric): Secondary | ICD-10-CM | POA: Diagnosis not present

## 2019-05-18 DIAGNOSIS — I340 Nonrheumatic mitral (valve) insufficiency: Secondary | ICD-10-CM

## 2019-05-18 DIAGNOSIS — I2510 Atherosclerotic heart disease of native coronary artery without angina pectoris: Secondary | ICD-10-CM

## 2019-05-18 DIAGNOSIS — R0609 Other forms of dyspnea: Secondary | ICD-10-CM

## 2019-05-18 MED ORDER — AMLODIPINE BESYLATE 5 MG PO TABS: 5.0000 mg | ORAL_TABLET | Freq: Every day | ORAL | 1 refills | Status: DC

## 2019-05-18 NOTE — Addendum Note (Signed)
Addended by: Aleatha Borer on: 05/18/2019 01:27 PM   Modules accepted: Orders

## 2019-05-18 NOTE — Patient Instructions (Signed)
Medication Instructions:  Your physician has recommended you make the following change in your medication:  DECREASE your Amlodipine to 5 mg daily  If you need a refill on your cardiac medications before your next appointment, please call your pharmacy.   Lab work: None ordered If you have labs (blood work) drawn today and your tests are completely normal, you will receive your results only by: Marland Kitchen MyChart Message (if you have MyChart) OR . A paper copy in the mail If you have any lab test that is abnormal or we need to change your treatment, we will call you to review the results.  Testing/Procedures: None ordered  Follow-Up: At [facility], you and your health needs are our priority.  As part of our continuing mission to provide you with exceptional heart care, we have created designated Provider Care Teams.  These Care Teams include your primary Cardiologist (physician) and Advanced Practice Providers (APPs -  Physician Assistants and Nurse Practitioners) who all work together to provide you with the care you need, when you need it. You will need a follow up appointment in 3 months. You may see Jenne Campus or another member of our [employer] Provider Team in [place]: Shirlee More, MD . Jyl Heinz, MD

## 2019-05-18 NOTE — Progress Notes (Signed)
Cardiology Office Note:    Date:  05/18/2019   ID:  Ryan Mendoza, DOB [birth year], MRN [id number]  PCP:  Angelina Sheriff, MD  Cardiologist:  Jenne Campus, MD    Referring MD: Angelina Sheriff, MD   No chief complaint on file. Swelling leg  History of Present Illness:    Ryan Mendoza is a 85 y.o. male with moderate regurgitation, coronary artery disease, obstructive sleep apnea I had a tele-visit with him yesterday he was complaining of having swollen legs especially left one he was not able to describe in the correct copy of the x-ray any evidence of inflammation therefore asked him to come to my office today.  He does have a minimal swelling of lower extremities and he said today his legs are particularly good he described to have some pain in the dorsal aspect of the left foot.  I palpated pulse which is palpable there is minimal swelling minimal pitting edema I do not see any discoloration of the skin and I do not see any evidence of inflammation overall foot looks good. Cardiac wise seems to be doing well.  He is described to her perception that he get profoundly weak and sometimes does not have any energy with slow heart rate he comes today to have a monitor placed.  Past Medical History:  Diagnosis Date  . Anxiety   . Aortic valve regurgitation   . Arthritis    hands and knees  . BPH (benign prostatic hyperplasia)   . Cataract, left eye   . Claustrophobia   . Coronary artery disease   . Depression    no current meds for  . Dyspnea    DOE  . Elevated cholesterol   . Gallstones   . GERD (gastroesophageal reflux disease)   . History of kidney stones   . Hypertension   . Hypothyroidism   . Myocardial infarction ([place])    SILENT  . Primary hyperparathyroidism ([place])   . 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 Tinnitus    both ears all the time    Past Surgical History:  Procedure Laterality Date  . CATARACT  EXTRACTION W/PHACO Left 04/12/2018   Procedure: CATARACT EXTRACTION PHACO AND INTRAOCULAR LENS PLACEMENT (IOC);  Surgeon: Birder Robson, MD;  Location: [facility] ORS;  Service: Ophthalmology;  Laterality: Left;  [place] 00:39.5 AP% 16.7 CDE 6.62 Fluid Pack Lot # [id number]  . CORONARY ANGIOPLASTY     2017  . EYE SURGERY Right    ioc for cataract  . PARATHYROIDECTOMY Right 09/28/2017   Procedure: RIGHT INFERIOR PARATHYROIDECTOMY;  Surgeon: Armandina Gemma, MD;  Location: [facility]S;  Service: General;  Laterality: Right;  . right eye  plug for tear duct surgery    . stents to heart x 2  06/2016    Current Medications: Current Meds  Medication Sig  . ALPRAZolam (XANAX) 0.25 MG tablet Take 1 tablet by mouth at bedtime.  Marland Kitchen amLODipine (NORVASC) 10 MG tablet Take 10 mg by mouth at bedtime.   Marland Kitchen aspirin EC 81 MG tablet Take 81 mg by mouth at bedtime.   Marland Kitchen atorvastatin (LIPITOR) 40 MG tablet Take 40 mg by mouth at bedtime.   . cloNIDine (CATAPRES) 0.1 MG tablet Take 0.1 mg by mouth 2 (two) times daily with a meal.   . levothyroxine (SYNTHROID, LEVOTHROID) 50 MCG tablet Take 50 mcg by mouth at bedtime.   . metoprolol  succinate (TOPROL XL) 25 MG 24 hr tablet Take 1 tablet (25 mg total) by mouth daily.  . nitroGLYCERIN (NITROSTAT) 0.4 MG SL tablet Place 1 tablet (0.4 mg total) under the tongue every 5 (five) minutes as needed for chest pain.  . pantoprazole (PROTONIX) 40 MG tablet Take 40 mg by mouth 2 (two) times daily.     Allergies:   Patient has no known allergies.   Social History   Socioeconomic History  . Marital status: Married    Spouse name: Not on file  . Number of children: Not on file  . Years of education: Not on file  . Highest education level: Not on file  Occupational History  . Not on file  Social Needs  . Financial resource strain: Not on file  . Food insecurity:    Worry: Not on file    Inability: Not on file  . Transportation needs:    Medical: Not on file    Non-medical: Not  on file  Tobacco Use  . Smoking status: Former Smoker    Packs/day: 1.00    Years: 10.00    Pack years: 10.00    Types: Cigarettes  . Smokeless tobacco: Never Used  . Tobacco comment: quit 1974  Substance and Sexual Activity  . Alcohol use: No  . Drug use: No  . Sexual activity: Not on file  Lifestyle  . Physical activity:    Days per week: Not on file    Minutes per session: Not on file  . Stress: Not on file  Relationships  . Social connections:    Talks on phone: Not on file    Gets together: Not on file    Attends religious service: Not on file    Active member of club or organization: Not on file    Attends meetings of clubs or organizations: Not on file    Relationship status: Not on file  Other Topics Concern  . Not on file  Social History Narrative  . Not on file     Family History: The patient's family history includes Heart attack in his father and mother; Heart disease in his father and mother. ROS:   Please see the history of present illness.    All 14 point review of systems negative except as described per history of present illness  EKGs/Labs/Other Studies Reviewed:      Recent Labs: No results found for requested labs within last 8760 hours.  Recent Lipid Panel No results found for: CHOL, TRIG, HDL, CHOLHDL, VLDL, LDLCALC, LDLDIRECT  Physical Exam:    VS:  BP 130/62   Pulse (!) 56   Wt 195 lb 12.8 oz (88.8 kg)   SpO2 97%   BMI 29.77 kg/m     Wt Readings from Last 3 Encounters:  05/18/19 195 lb 12.8 oz (88.8 kg)  03/16/19 188 lb (85.3 kg)  02/15/19 189 lb 3.2 oz (85.8 kg)     GEN:  Well nourished, well developed in no acute distress HEENT: Normal NECK: No JVD; No carotid bruits LYMPHATICS: No lymphadenopathy CARDIAC: RRR, no murmurs, no rubs, no gallops RESPIRATORY:  Clear to auscultation without rales, wheezing or rhonchi  ABDOMEN: Soft, non-tender, non-distended MUSCULOSKELETAL:  No edema; No deformity  SKIN: Warm and dry LOWER  EXTREMITIES: no swelling NEUROLOGIC:  Alert and oriented x 3 PSYCHIATRIC:  Normal affect   ASSESSMENT:    1. Nonrheumatic mitral valve regurgitation   2. Coronary artery disease involving native coronary artery of native  heart without angina pectoris   3. Obstructive sleep apnea   4. Dyspnea on exertion    PLAN:    In order of problems listed above:  1. Swelling of lower extremities I suspect partially related to amlodipine asked him to cut amlodipine from 10-5.  I told him to keep [place] updated how situation resolved.  May be forced to discontinue this medication and then at some diuretic.  We may be forced to look at his heart again to make sure it is functioning properly. 2. Coronary disease stable on appropriate medication which I will continue 3. Obstructive sleep apnea followed by internal medicine team 4. Dyspnea exertion denies having any   Medication Adjustments/Labs and Tests Ordered: Current medicines are reviewed at length with the patient today.  Concerns regarding medicines are outlined above.  No orders of the defined types were placed in this encounter.  Medication changes: No orders of the defined types were placed in this encounter.   Signed, Park Liter, MD, [facility] 05/18/2019 1:08 PM    [place]

## 2019-06-02 DIAGNOSIS — R42 Dizziness and giddiness: Secondary | ICD-10-CM | POA: Diagnosis not present

## 2019-06-13 ENCOUNTER — Other Ambulatory Visit: Payer: Self-pay

## 2019-06-13 MED ORDER — METOPROLOL SUCCINATE ER 25 MG PO TB24: 25.0000 mg | ORAL_TABLET | Freq: Every day | ORAL | 6 refills | Status: DC

## 2019-06-19 ENCOUNTER — Telehealth (INDEPENDENT_AMBULATORY_CARE_PROVIDER_SITE_OTHER): Payer: Medicare Other | Admitting: Cardiology

## 2019-06-19 ENCOUNTER — Encounter: Payer: Self-pay | Admitting: Cardiology

## 2019-06-19 ENCOUNTER — Other Ambulatory Visit: Payer: Self-pay

## 2019-06-19 DIAGNOSIS — I10 Essential (primary) hypertension: Secondary | ICD-10-CM

## 2019-06-19 DIAGNOSIS — R0609 Other forms of dyspnea: Secondary | ICD-10-CM

## 2019-06-19 DIAGNOSIS — I2510 Atherosclerotic heart disease of native coronary artery without angina pectoris: Secondary | ICD-10-CM | POA: Diagnosis not present

## 2019-06-19 DIAGNOSIS — I340 Nonrheumatic mitral (valve) insufficiency: Secondary | ICD-10-CM

## 2019-06-19 HISTORY — DX: Essential (primary) hypertension: I10

## 2019-06-19 NOTE — Progress Notes (Signed)
Virtual Visit via Telephone Note   This visit type was conducted due to national recommendations for restrictions regarding the COVID-19 Pandemic (e.g. social distancing) in an effort to limit this patient's exposure and mitigate transmission in our community.  Due to his co-morbid illnesses, this patient is at least at moderate risk for complications without adequate follow up.  This format is felt to be most appropriate for this patient at this time.  The patient did not have access to video technology/had technical difficulties with video requiring transitioning to audio format only (telephone).  All issues noted in this document were discussed and addressed.  No physical exam could be performed with this format.  Please refer to the patient's chart for his  consent to telehealth for [facility].  Evaluation Performed:  Follow-up visit  This visit type was conducted due to national recommendations for restrictions regarding the COVID-19 Pandemic (e.g. social distancing).  This format is felt to be most appropriate for this patient at this time.  All issues noted in this document were discussed and addressed.  No physical exam was performed (except for noted visual exam findings with Video Visits).  Please refer to the patient's chart (MyChart message for video visits and phone note for telephone visits) for the patient's consent to telehealth for [facility].  Date:  06/19/2019  ID: Ryan Mendoza, DOB 08/04/1934, MRN [id number]   Patient Location: [address] [place]  [id number]   Provider location:   [place] Office  PCP:  Angelina Sheriff, MD  Cardiologist:  Jenne Campus, MD     Chief Complaint: I am doing very well  History of Present Illness:    Ryan Mendoza is a 85 y.o. male  who presents via audio/video conferencing for a telehealth visit today.  With mitral regurgitation, essential hypertension, coronary artery disease.  He started complain of having  some swelling of lower extremities.  I cut down his Norvasc from 10-5 and now everything looks much better.  He feels very good he is very active in the matter-of-fact just yesterday he cut grass using push mower.  He got no difficulty doing it.  Overall he is doing well   The patient does not have symptoms concerning for COVID-19 infection (fever, chills, cough, or new SHORTNESS OF BREATH).    Prior CV studies:   The following studies were reviewed today:  Monitor showed narrow complex tachycardia but short episodes.  Asymptomatic.     Past Medical History:  Diagnosis Date  . Anxiety   . Aortic valve regurgitation   . Arthritis    hands and knees  . BPH (benign prostatic hyperplasia)   . Cataract, left eye   . Claustrophobia   . Coronary artery disease   . Depression    no current meds for  . Dyspnea    DOE  . Elevated cholesterol   . Gallstones   . GERD (gastroesophageal reflux disease)   . History of kidney stones   . Hypertension   . Hypothyroidism   . Myocardial infarction ([place])    SILENT  . Primary hyperparathyroidism ([place])   . 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 Tinnitus    both ears all the time    Past Surgical History:  Procedure Laterality Date  . CATARACT EXTRACTION W/PHACO Left 04/12/2018   Procedure: CATARACT EXTRACTION PHACO AND INTRAOCULAR LENS PLACEMENT (IOC);  Surgeon: George Ina,  Gwyndolyn Saxon, MD;  Location: [facility] ORS;  Service: Ophthalmology;  Laterality: Left;  [place] 00:39.5 AP% 16.7 CDE 6.62 Fluid Pack Lot # [id number]  . CORONARY ANGIOPLASTY     2017  . EYE SURGERY Right    ioc for cataract  . PARATHYROIDECTOMY Right 09/28/2017   Procedure: RIGHT INFERIOR PARATHYROIDECTOMY;  Surgeon: Armandina Gemma, MD;  Location: [facility];  Service: General;  Laterality: Right;  . right eye  plug for tear duct surgery    . stents to heart x 2  06/2016     Current Meds  Medication Sig  . ALPRAZolam (XANAX) 0.25 MG  tablet Take 1 tablet by mouth at bedtime.  Marland Kitchen amLODipine (NORVASC) 5 MG tablet Take 1 tablet (5 mg total) by mouth at bedtime.  Marland Kitchen aspirin EC 81 MG tablet Take 81 mg by mouth at bedtime.   Marland Kitchen atorvastatin (LIPITOR) 40 MG tablet Take 40 mg by mouth at bedtime.   . cloNIDine (CATAPRES) 0.1 MG tablet Take 0.1 mg by mouth 2 (two) times daily with a meal.   . levothyroxine (SYNTHROID, LEVOTHROID) 50 MCG tablet Take 50 mcg by mouth at bedtime.   . metoprolol succinate (TOPROL XL) 25 MG 24 hr tablet Take 1 tablet (25 mg total) by mouth daily.  . nitroGLYCERIN (NITROSTAT) 0.4 MG SL tablet Place 1 tablet (0.4 mg total) under the tongue every 5 (five) minutes as needed for chest pain.  . pantoprazole (PROTONIX) 40 MG tablet Take 40 mg by mouth 2 (two) times daily.      Family History: The patient's family history includes Heart attack in his father and mother; Heart disease in his father and mother.   ROS:   Please see the history of present illness.     All other systems reviewed and are negative.   Labs/Other Tests and Data Reviewed:     Recent Labs: No results found for requested labs within last 8760 hours.  Recent Lipid Panel No results found for: CHOL, TRIG, HDL, CHOLHDL, VLDL, LDLCALC, LDLDIRECT    Exam:    Vital Signs:  There were no vitals taken for this visit.    Wt Readings from Last 3 Encounters:  05/18/19 195 lb 12.8 oz (88.8 kg)  03/16/19 188 lb (85.3 kg)  02/15/19 189 lb 3.2 oz (85.8 kg)     Well nourished, well developed in no acute distress. Alert awake in exam 3 talking to me over the phone.  He is unable to establish video link.  Overall asymptomatic and doing well  Diagnosis for this visit:   1. Coronary artery disease involving native coronary artery of native heart without angina pectoris   2. Nonrheumatic mitral valve regurgitation   3. Dyspnea on exertion   4. Essential hypertension      ASSESSMENT & PLAN:    1.  Coronary artery disease stable  without any symptoms we will continue present management. 2.Mitral regurgitation: Doing well no shortness of breath no palpitations.  We will continue monitoring next year he will required another echocardiogram 3.  Dyspnea on exertion denies having any 4.  Essential hypertension blood pressure well controlled  COVID-19 Education: The signs and symptoms of COVID-19 were discussed with the patient and how to seek care for testing (follow up with PCP or arrange E-visit).  The importance of social distancing was discussed today.  Patient Risk:   After full review of this patients clinical status, I feel that they are at least moderate risk at this time.  Time:  Today, I have spent 15 minutes with the patient with telehealth technology discussing pt health issues.  I spent 5 minutes reviewing her chart before the visit.  Visit was finished at 10:04 AM.    Medication Adjustments/Labs and Tests Ordered: Current medicines are reviewed at length with the patient today.  Concerns regarding medicines are outlined above.  No orders of the defined types were placed in this encounter.  Medication changes: No orders of the defined types were placed in this encounter.    Disposition: Follow-up in 3 months  Signed, Park Liter, MD, [facility] 06/19/2019 10:04 AM    [place]

## 2019-06-19 NOTE — Patient Instructions (Signed)
Medication Instructions:   Your physician recommends that you continue on your current medications as directed. Please refer to the Current Medication list given to you today.  If you need a refill on your cardiac medications before your next appointment, please call your pharmacy.   Lab work: None If you have labs (blood work) drawn today and your tests are completely normal, you will receive your results only by: Marland Kitchen MyChart Message (if you have MyChart) OR . A paper copy in the mail If you have any lab test that is abnormal or we need to change your treatment, we will call you to review the results.  Testing/Procedures: NOne  Follow-Up: At [facility], you and your health needs are our priority.  As part of our continuing mission to provide you with exceptional heart care, we have created designated Provider Care Teams.  These Care Teams include your primary Cardiologist (physician) and Advanced Practice Providers (APPs -  Physician Assistants and Nurse Practitioners) who all work together to provide you with the care you need, when you need it. You will need a follow up appointment in 3 months.   Any Other Special Instructions Will Be Listed Below (If Applicable).

## 2019-07-19 DIAGNOSIS — Z9181 History of falling: Secondary | ICD-10-CM | POA: Diagnosis not present

## 2019-07-19 DIAGNOSIS — I10 Essential (primary) hypertension: Secondary | ICD-10-CM | POA: Diagnosis not present

## 2019-08-24 ENCOUNTER — Ambulatory Visit: Payer: Medicare Other | Admitting: Cardiology

## 2019-09-26 ENCOUNTER — Ambulatory Visit: Payer: Medicare Other | Admitting: Cardiology

## 2019-10-04 ENCOUNTER — Telehealth: Payer: Self-pay | Admitting: Cardiology

## 2019-10-04 NOTE — Telephone Encounter (Signed)
Patient is going to dentist 10/26, should he have something to premedicate him like prior?

## 2019-10-05 NOTE — Telephone Encounter (Signed)
Called patient informed him that per Dr. Agustin Cree there is no need to premedicate for a dentist appointment. Patient verbally understands.

## 2019-10-05 NOTE — Telephone Encounter (Signed)
No need to premedicate

## 2019-10-10 ENCOUNTER — Ambulatory Visit: Payer: Medicare Other | Admitting: Cardiology

## 2019-11-08 ENCOUNTER — Other Ambulatory Visit: Payer: Self-pay

## 2019-11-08 ENCOUNTER — Ambulatory Visit (INDEPENDENT_AMBULATORY_CARE_PROVIDER_SITE_OTHER): Payer: Medicare Other | Admitting: Cardiology

## 2019-11-08 ENCOUNTER — Encounter: Payer: Self-pay | Admitting: Cardiology

## 2019-11-08 VITALS — BP 162/66 | HR 44 | Ht 69.0 in | Wt 196.0 lb

## 2019-11-08 DIAGNOSIS — R5383 Other fatigue: Secondary | ICD-10-CM

## 2019-11-08 DIAGNOSIS — I340 Nonrheumatic mitral (valve) insufficiency: Secondary | ICD-10-CM

## 2019-11-08 DIAGNOSIS — I25118 Atherosclerotic heart disease of native coronary artery with other forms of angina pectoris: Secondary | ICD-10-CM | POA: Diagnosis not present

## 2019-11-08 DIAGNOSIS — E782 Mixed hyperlipidemia: Secondary | ICD-10-CM

## 2019-11-08 DIAGNOSIS — R0602 Shortness of breath: Secondary | ICD-10-CM

## 2019-11-08 DIAGNOSIS — I10 Essential (primary) hypertension: Secondary | ICD-10-CM

## 2019-11-08 MED ORDER — NITROGLYCERIN 0.4 MG SL SUBL: 0.4000 mg | SUBLINGUAL_TABLET | SUBLINGUAL | 3 refills | Status: DC | PRN

## 2019-11-08 MED ORDER — FUROSEMIDE 20 MG PO TABS: 20.0000 mg | ORAL_TABLET | Freq: Every day | ORAL | 3 refills | Status: DC

## 2019-11-08 NOTE — Progress Notes (Signed)
Cardiology Office Note:    Date:  11/08/2019   ID:  Ryan Mendoza, DOB [birth year], MRN [id number]  PCP:  Ryan Sheriff, MD  Cardiologist:  Ryan More, MD    Referring MD: Ryan Sheriff, MD    ASSESSMENT:    1. Coronary artery disease of native artery of native heart with stable angina pectoris ([place])   2. Shortness of breath   3. Essential hypertension   4. Fatigue, unspecified type    PLAN:    In order of problems listed above:  1. Bradycardia we will stop his beta-blocker and reassess with an office EKG in 1 week.  I certainly think the bradycardia and chronotropic incompetence plays a role in his marked exercise intolerance and fatigue 2. Congestive heart failure peripheral edema shortness of breath start low-dose of a loop diuretic check labs including renal function potassium proBNP level.  If his proBNP level is elevated we may need to consider the issue of how severe his mitral regurgitation is with clinical congestive heart failure 3. Hypertension controlled continue current treatment calcium channel blocker centrally active clonidine 4. CAD with stable exertional angina renew prescription for nitroglycerin 5. Dyslipidemia stable continue with statin 6. Mitral regurgitation, if proBNP is elevated and he does have a diagnosis of heart failure may benefit from a TEE to define severity of his MR.   Next appointment: 1 week with Ryan Mendoza   Medication Adjustments/Labs and Tests Ordered: Current medicines are reviewed at length with the patient today.  Concerns regarding medicines are outlined above.  Orders Placed This Encounter  Procedures  . Basic Metabolic Panel (BMET)  . Pro b natriuretic peptide (BNP)  . CBC  . TSH  . EKG 12-Lead   Meds ordered this encounter  Medications  . furosemide (LASIX) 20 MG tablet    Sig: Take 1 tablet (20 mg total) by mouth daily.    Dispense:  30 tablet    Refill:  3  . nitroGLYCERIN (NITROSTAT) 0.4 MG SL tablet     Sig: Place 1 tablet (0.4 mg total) under the tongue every 5 (five) minutes as needed for chest pain.    Dispense:  25 tablet    Refill:  3    Chief Complaint  Patient presents with  . Follow-up  . Shortness of Breath    History of Present Illness:    Ryan Mendoza is a 85 y.o. male with a hx of coronary artery disease there is a notation that he has had previous PCI and stenting of the LAD and left circumflex in the range of 2017.  He has a history of shortness of breath and longstanding hypertension hyperlipidemia and diabetes.  He does not have a history of heart failure and is not on a diuretic.  Echocardiogram in February showed diastolic dysfunction pseudonormal with elevated left atrial pressure and mild to moderate aortic regurgitation.  The aortic root and ascending aorta are moderately dilated. Compliance with diet, lifestyle and medications: Yes  He does not feel well.  He says this has been progressive over rhythm months and is increasing malaise fatigue on heart rates have been as low as 40 and he short of breath with minor activities walking outdoors and gardening work.  He sleeps on one large pillow is not having PND he has intermittent angina the last episode a few weeks ago relieved with several nitroglycerin.  He is just concerned about the progressive worsening nature and is concerned he may  need valvular intervention.  When seen today he is bradycardic with a resting heart rate of 44 bpm he will stop his beta-blocker he is short of breath and has 2+ edema to the knees he will start a loop diuretic and be reassessed in the office in 1 week if he is not improved I think he would benefit from further evaluation including consideration of either transesophageal echocardiogram or right and left heart catheterization regarding CAD and mitral regurgitation.  The patient and his wife agree with this approach.  Echo 01/26/2019:  1. The left ventricle has normal systolic function, with  an ejection fraction of 55-60%. Left ventricular diastolic Doppler parameters are consistent with pseudonormalization No evidence of left ventricular regional wall motion abnormalities. Size and  wall thickness were not measured although the LV appears mildly dilated on SAX view.  2. The right ventricle has normal systolic function. The cavity was moderately enlarged. There is not assessed.  3. Left atrial size was moderately dilated.  4. Right atrial size was mildly dilated.  5. Moderate ecentric MR is present, the PV was not evaluated and Mendoza severe MR can not be excluded.  6. The aortic valve is tricuspid There is mild calcification of the aortic valve. Aortic valve regurgitation is mild to moderate by color flow Doppler and PT1/2.Marland Kitchen  7. There is moderate dilatation of the aortic root and of the ascending aorta measuring 40 mm.  8. No evidence of left ventricular regional wall motion abnormalities.  Past Medical History:  Diagnosis Date  . Anxiety   . Aortic valve regurgitation   . Arthritis    hands and knees  . BPH (benign prostatic hyperplasia)   . Cataract, left eye   . Claustrophobia   . Coronary artery disease   . Depression    no current meds for  . Dyspnea    DOE  . Elevated cholesterol   . Gallstones   . GERD (gastroesophageal reflux disease)   . History of kidney stones   . Hypertension   . Hypothyroidism   . Myocardial infarction ([place])    SILENT  . Primary hyperparathyroidism ([place])   . 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 Tinnitus    both ears all the time    Past Surgical History:  Procedure Laterality Date  . CATARACT EXTRACTION W/PHACO Left 04/12/2018   Procedure: CATARACT EXTRACTION PHACO AND INTRAOCULAR LENS PLACEMENT (IOC);  Surgeon: Birder Robson, MD;  Location: [facility] ORS;  Service: Ophthalmology;  Laterality: Left;  [place] 00:39.5 AP% 16.7 CDE 6.62 Fluid Pack Lot # [id number]  . CORONARY ANGIOPLASTY      2017  . EYE SURGERY Right    ioc for cataract  . PARATHYROIDECTOMY Right 09/28/2017   Procedure: RIGHT INFERIOR PARATHYROIDECTOMY;  Surgeon: Armandina Gemma, MD;  Location: [facility] ORS;  Service: General;  Laterality: Right;  . right eye  plug for tear duct surgery    . stents to heart x 2  06/2016    Current Medications: Current Meds  Medication Sig  . ALPRAZolam (XANAX) 0.25 MG tablet Take 1 tablet by mouth at bedtime.  Marland Kitchen amLODipine (NORVASC) 5 MG tablet Take 1 tablet (5 mg total) by mouth at bedtime.  Marland Kitchen aspirin EC 81 MG tablet Take 81 mg by mouth at bedtime.   Marland Kitchen atorvastatin (LIPITOR) 40 MG tablet Take 40 mg by mouth at bedtime.   . cloNIDine (CATAPRES) 0.1 MG tablet Take 0.1 mg  by mouth 2 (two) times daily with a meal.   . escitalopram (LEXAPRO) 10 MG tablet Take 10 mg by mouth daily.  Marland Kitchen levothyroxine (SYNTHROID, LEVOTHROID) 50 MCG tablet Take 50 mcg by mouth at bedtime.   . nitroGLYCERIN (NITROSTAT) 0.4 MG SL tablet Place 1 tablet (0.4 mg total) under the tongue every 5 (five) minutes as needed for chest pain.  . pantoprazole (PROTONIX) 40 MG tablet Take 40 mg by mouth 2 (two) times daily.  . [DISCONTINUED] metoprolol succinate (TOPROL XL) 25 MG 24 hr tablet Take 1 tablet (25 mg total) by mouth daily.  . [DISCONTINUED] nitroGLYCERIN (NITROSTAT) 0.4 MG SL tablet Place 1 tablet (0.4 mg total) under the tongue every 5 (five) minutes as needed for chest pain.     Allergies:   Patient has no known allergies.   Social History   Socioeconomic History  . Marital status: Married    Spouse name: Not on file  . Number of children: Not on file  . Years of education: Not on file  . Highest education level: Not on file  Occupational History  . Not on file  Social Needs  . Financial resource strain: Not on file  . Food insecurity    Worry: Not on file    Inability: Not on file  . Transportation needs    Medical: Not on file    Non-medical: Not on file  Tobacco Use  . Smoking status:  Former Smoker    Packs/day: 1.00    Years: 10.00    Pack years: 10.00    Types: Cigarettes  . Smokeless tobacco: Never Used  . Tobacco comment: quit 1974  Substance and Sexual Activity  . Alcohol use: No  . Drug use: No  . Sexual activity: Not on file  Lifestyle  . Physical activity    Days per week: Not on file    Minutes per session: Not on file  . Stress: Not on file  Relationships  . Social Herbalist on phone: Not on file    Gets together: Not on file    Attends religious service: Not on file    Active member of club or organization: Not on file    Attends meetings of clubs or organizations: Not on file    Relationship status: Not on file  Other Topics Concern  . Not on file  Social History Narrative  . Not on file     Family History: The patient's family history includes Heart attack in his father and mother; Heart disease in his father and mother. ROS:   Please see the history of present illness.    All other systems reviewed and are negative.  EKGs/Labs/Other Studies Reviewed:    The following studies were reviewed today:  EKG:  EKG ordered today and personally reviewed.  The ekg ordered today demonstrates sinus bradycardia 44 bpm left ventricular hypertrophy nonspecific T waves  Recent Labs: No results found for requested labs within last 8760 hours.  Recent Lipid Panel No results found for: CHOL, TRIG, HDL, CHOLHDL, VLDL, LDLCALC, LDLDIRECT  Physical Exam:    VS:  BP (!) 162/66 (BP Location: Right Arm, Patient Position: Sitting, Cuff Size: Normal)   Pulse (!) 44   Ht 5\' 9"  (1.753 m)   Wt 196 lb (88.9 kg)   SpO2 97%   BMI 28.94 kg/m     Wt Readings from Last 3 Encounters:  11/08/19 196 lb (88.9 kg)  05/18/19 195 lb 12.8 oz (  88.8 kg)  03/16/19 188 lb (85.3 kg)     GEN: He appears apprehensive well nourished, well developed in no acute distress HEENT: Normal NECK: No JVD; No carotid bruits LYMPHATICS: No lymphadenopathy CARDIAC: Soft  S1 2/6 to 3/6 murmur of MR throughout the entire precordium radiating out into the axilla no S3 S2 was normal RRR, RESPIRATORY:  Clear to auscultation without rales, wheezing or rhonchi  ABDOMEN: Soft, non-tender, non-distended MUSCULOSKELETAL: Bilateral 2+ to the knee pitting lower extremity edema; No deformity  SKIN: Warm and dry NEUROLOGIC:  Alert and oriented x 3 PSYCHIATRIC:  Normal affect    Signed, Ryan More, MD  11/08/2019 2:54 PM    [facility]

## 2019-11-08 NOTE — Patient Instructions (Addendum)
Medication Instructions:  Your physician has recommended you make the following change in your medication:   STOP metoprolol succinate  START furosemide (lasix) 20 mg: Take 1 tablet daily  *If you need a refill on your cardiac medications before your next appointment, please call your pharmacy*  Lab Work: Your physician recommends that you return for lab work today: BMP, ProBNP, CBC, TSH.   If you have labs (blood work) drawn today and your tests are completely normal, you will receive your results only by: Marland Kitchen MyChart Message (if you have MyChart) OR . A paper copy in the mail If you have any lab test that is abnormal or we need to change your treatment, we will call you to review the results.  Testing/Procedures: You had an EKG today.   Follow-Up: At [facility], you and your health needs are our priority.  As part of our continuing mission to provide you with exceptional heart care, we have created designated Provider Care Teams.  These Care Teams include your primary Cardiologist (physician) and Advanced Practice Providers (APPs -  Physician Assistants and Nurse Practitioners) who all work together to provide you with the care you need, when you need it.  Your next appointment:   1 week(s)  The format for your next appointment:   In Person  Provider:   Jenne Campus, MD   Furosemide tablets What is this medicine? FUROSEMIDE (fyoor [place] se mide) is a diuretic. It helps you make more urine and to lose salt and excess water from your body. This medicine is used to treat high blood pressure, and edema or swelling from heart, kidney, or liver disease. This medicine may be used for other purposes; ask your health care provider or pharmacist if you have questions. COMMON BRAND NAME(S): Active-Medicated Specimen Kit, Delone, Diuscreen, Lasix, RX Specimen Collection Kit, Specimen Collection Kit, URINX Medicated Specimen Collection What should I tell my health care provider before I  take this medicine? They need to know if you have any of these conditions:  abnormal blood electrolytes  diarrhea or vomiting  gout  heart disease  kidney disease, small amounts of urine, or difficulty passing urine  liver disease  thyroid disease  an unusual or allergic reaction to furosemide, sulfa drugs, other medicines, foods, dyes, or preservatives  pregnant or trying to get pregnant  breast-feeding How should I use this medicine? Take this medicine by mouth with a glass of water. Follow the directions on the prescription label. You may take this medicine with or without food. If it upsets your stomach, take it with food or milk. Do not take your medicine more often than directed. Remember that you will need to pass more urine after taking this medicine. Do not take your medicine at a time of day that will cause you problems. Do not take at bedtime. Talk to your pediatrician regarding the use of this medicine in children. While this drug may be prescribed for selected conditions, precautions do apply. Overdosage: If you think you have taken too much of this medicine contact a poison control center or emergency room at once. NOTE: This medicine is only for you. Do not share this medicine with others. What if I miss a dose? If you miss a dose, take it as soon as you can. If it is almost time for your next dose, take only that dose. Do not take double or extra doses. What may interact with this medicine?  aspirin and aspirin-like medicines  certain antibiotics  chloral hydrate  cisplatin  cyclosporine  digoxin  diuretics  laxatives  lithium  medicines for blood pressure  medicines that relax muscles for surgery  methotrexate  NSAIDs, medicines for pain and inflammation like ibuprofen, naproxen, or indomethacin  phenytoin  steroid medicines like prednisone or cortisone  sucralfate  thyroid hormones This list may not describe all possible interactions.  Give your health care provider a list of all the medicines, herbs, non-prescription drugs, or dietary supplements you use. Also tell them if you smoke, drink alcohol, or use illegal drugs. Some items may interact with your medicine. What should I watch for while using this medicine? Visit your doctor or health care provider for regular checks on your progress. Check your blood pressure regularly. Ask your doctor or health care provider what your blood pressure should be, and when you should contact him or her. If you are a diabetic, check your blood sugar as directed. This medicine may cause serious skin reactions. They can happen weeks to months after starting the medicine. Contact your health care provider right away if you notice fevers or flu-like symptoms with a rash. The rash may be red or purple and then turn into blisters or peeling of the skin. Or, you might notice a red rash with swelling of the face, lips or lymph nodes in your neck or under your arms. You may need to be on a special diet while taking this medicine. Check with your doctor. Also, ask how many glasses of fluid you need to drink a day. You must not get dehydrated. You may get drowsy or dizzy. Do not drive, use machinery, or do anything that needs mental alertness until you know how this drug affects you. Do not stand or sit up quickly, especially if you are an older patient. This reduces the risk of dizzy or fainting spells. Alcohol can make you more drowsy and dizzy. Avoid alcoholic drinks. This medicine can make you more sensitive to the sun. Keep out of the sun. If you cannot avoid being in the sun, wear protective clothing and use sunscreen. Do not use sun lamps or tanning beds/booths. What side effects may I notice from receiving this medicine? Side effects that you should report to your doctor or health care professional as soon as possible:  blood in urine or stools  dry mouth  fever or chills  hearing loss or ringing  in the ears  irregular heartbeat  muscle pain or weakness, cramps  rash, fever, and swollen lymph nodes  redness, blistering, peeling or loosening of the skin, including inside the mouth  skin rash  stomach upset, pain, or nausea  tingling or numbness in the hands or feet  unusually weak or tired  vomiting or diarrhea  yellowing of the eyes or skin Side effects that usually do not require medical attention (report to your doctor or health care professional if they continue or are bothersome):  headache  loss of appetite  unusual bleeding or bruising This list may not describe all possible side effects. Call your doctor for medical advice about side effects. You may report side effects to FDA at 1-800-FDA-1088. Where should I keep my medicine? Keep out of the reach of children. Store at room temperature between 15 and 30 degrees C (59 and 86 degrees F). Protect from light. Throw away any unused medicine after the expiration date. NOTE: This sheet is a summary. It may not cover all possible information. If you have questions about this medicine, talk  to your doctor, pharmacist, or health care provider.  2020 Elsevier/Gold Standard (2019-03-03 14:04:13)

## 2019-11-09 LAB — BASIC METABOLIC PANEL
BUN/Creatinine Ratio: 14 (ref 10–24)
BUN: 23 mg/dL (ref 8–27)
CO2: 25 mmol/L (ref 20–29)
Calcium: 9.4 mg/dL (ref 8.6–10.2)
Chloride: 102 mmol/L (ref 96–106)
Creatinine, Ser: 1.64 mg/dL — ABNORMAL HIGH (ref 0.76–1.27)
GFR calc Af Amer: 44 mL/min/{1.73_m2} — ABNORMAL LOW (ref >59)
GFR calc non Af Amer: 38 mL/min/{1.73_m2} — ABNORMAL LOW (ref >59)
Glucose: 90 mg/dL (ref 65–99)
Potassium: 4.9 mmol/L (ref 3.5–5.2)
Sodium: 141 mmol/L (ref 134–144)

## 2019-11-09 LAB — CBC
Hematocrit: 46.8 % (ref 37.5–51.0)
Hemoglobin: 15.2 g/dL (ref 13.0–17.7)
MCH: 28.3 pg (ref 26.6–33.0)
MCHC: 32.5 g/dL (ref 31.5–35.7)
MCV: 87 fL (ref 79–97)
Platelets: 183 10*3/uL (ref 150–450)
RBC: 5.38 x10E6/uL (ref 4.14–5.80)
RDW: 13.4 % (ref 11.6–15.4)
WBC: 8.1 10*3/uL (ref 3.4–10.8)

## 2019-11-09 LAB — TSH: TSH: 5.52 u[IU]/mL — ABNORMAL HIGH (ref 0.450–4.500)

## 2019-11-09 LAB — PRO B NATRIURETIC PEPTIDE: NT-Pro BNP: 534 pg/mL — ABNORMAL HIGH (ref 0–486)

## 2019-11-13 ENCOUNTER — Other Ambulatory Visit: Payer: Self-pay

## 2019-11-13 DIAGNOSIS — K219 Gastro-esophageal reflux disease without esophagitis: Secondary | ICD-10-CM | POA: Diagnosis not present

## 2019-11-13 DIAGNOSIS — I10 Essential (primary) hypertension: Secondary | ICD-10-CM | POA: Diagnosis not present

## 2019-11-13 MED ORDER — FUROSEMIDE 20 MG PO TABS: 20.0000 mg | ORAL_TABLET | Freq: Every day | ORAL | 3 refills | Status: DC

## 2019-11-22 ENCOUNTER — Ambulatory Visit (INDEPENDENT_AMBULATORY_CARE_PROVIDER_SITE_OTHER): Payer: Medicare Other | Admitting: Cardiology

## 2019-11-22 ENCOUNTER — Encounter: Payer: Self-pay | Admitting: Cardiology

## 2019-11-22 ENCOUNTER — Other Ambulatory Visit: Payer: Self-pay

## 2019-11-22 VITALS — BP 112/60 | HR 55 | Ht 69.0 in | Wt 194.0 lb

## 2019-11-22 DIAGNOSIS — R001 Bradycardia, unspecified: Secondary | ICD-10-CM

## 2019-11-22 DIAGNOSIS — I340 Nonrheumatic mitral (valve) insufficiency: Secondary | ICD-10-CM | POA: Diagnosis not present

## 2019-11-22 DIAGNOSIS — I25118 Atherosclerotic heart disease of native coronary artery with other forms of angina pectoris: Secondary | ICD-10-CM

## 2019-11-22 DIAGNOSIS — I10 Essential (primary) hypertension: Secondary | ICD-10-CM

## 2019-11-22 HISTORY — DX: Bradycardia, unspecified: R00.1

## 2019-11-22 MED ORDER — LEVOTHYROXINE SODIUM 75 MCG PO TABS: 75.0000 ug | ORAL_TABLET | Freq: Every day | ORAL | 2 refills | Status: DC

## 2019-11-22 NOTE — Progress Notes (Signed)
Cardiology Office Note:    Date:  11/22/2019   ID:  ASWAD MCMANUS, DOB Jan 30, 1934, MRN [id number]  PCP:  Angelina Sheriff, MD  Cardiologist:  Jenne Campus, MD    Referring MD: Angelina Sheriff, MD   Chief Complaint  Patient presents with  . Follow-up  Doing well  History of Present Illness:    Ryan Mendoza is a 85 y.o. male with mitral regurgitation which is moderate, sinus bradycardia with recent discontinuation of beta-blocker, hypothyroidism, remote coronary artery disease.  He comes today to my office for follow-up.  Recently his metoprolol has been discontinued because of bradycardia his heart rate is better but he still describes episodes of heart rate going in the lower 40s.  Overall he seems to be doing well however sometimes to get episode of profound weakness and fatigue.  Also a part of evaluation reveal hypothyroidism.  We had a long discussion about what to do with the situation I think the first step will be trying to correct hypothyroidism.  Therefore, I will ask him to increase dose of Synthroid from 50 mcg to 75 mcg.  We will see him back in my office in about 6 weeks to see how he does and thyroid profile will be checked again at the time he most likely required wearing monitor.  He was also given diuretic in form of furosemide 20 mg daily he cannot tell if he feels any better.  Overall said he is fine to just sometimes get episode fatigue and tiredness.  Past Medical History:  Diagnosis Date  . Anxiety   . Aortic valve regurgitation   . Arthritis    hands and knees  . BPH (benign prostatic hyperplasia)   . Cataract, left eye   . Claustrophobia   . Coronary artery disease   . Depression    no current meds for  . Dyspnea    DOE  . Elevated cholesterol   . Gallstones   . GERD (gastroesophageal reflux disease)   . History of kidney stones   . Hypertension   . Hypothyroidism   . Myocardial infarction ([place])    SILENT  . Primary hyperparathyroidism  ([place])   . 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 Tinnitus    both ears all the time    Past Surgical History:  Procedure Laterality Date  . CATARACT EXTRACTION W/PHACO Left 04/12/2018   Procedure: CATARACT EXTRACTION PHACO AND INTRAOCULAR LENS PLACEMENT (IOC);  Surgeon: Birder Robson, MD;  Location: [facility] ORS;  Service: Ophthalmology;  Laterality: Left;  [place] 00:39.5 AP% 16.7 CDE 6.62 Fluid Pack Lot # [id number]  . CORONARY ANGIOPLASTY     2017  . EYE SURGERY Right    ioc for cataract  . PARATHYROIDECTOMY Right 09/28/2017   Procedure: RIGHT INFERIOR PARATHYROIDECTOMY;  Surgeon: Armandina Gemma, MD;  Location: [facility];  Service: General;  Laterality: Right;  . right eye  plug for tear duct surgery    . stents to heart x 2  06/2016    Current Medications: Current Meds  Medication Sig  . ALPRAZolam (XANAX) 0.25 MG tablet Take 1 tablet by mouth at bedtime.  Marland Kitchen amLODipine (NORVASC) 5 MG tablet Take 1 tablet (5 mg total) by mouth at bedtime.  Marland Kitchen aspirin EC 81 MG tablet Take 81 mg by mouth at bedtime.   Marland Kitchen atorvastatin (LIPITOR) 40 MG tablet Take 40 mg by mouth at bedtime.   Marland Kitchen  cloNIDine (CATAPRES) 0.1 MG tablet Take 0.1 mg by mouth 2 (two) times daily with a meal.   . escitalopram (LEXAPRO) 10 MG tablet Take 10 mg by mouth daily.  . furosemide (LASIX) 20 MG tablet Take 1 tablet (20 mg total) by mouth daily.  Marland Kitchen levothyroxine (SYNTHROID, LEVOTHROID) 50 MCG tablet Take 50 mcg by mouth at bedtime.   . nitroGLYCERIN (NITROSTAT) 0.4 MG SL tablet Place 1 tablet (0.4 mg total) under the tongue every 5 (five) minutes as needed for chest pain.  . pantoprazole (PROTONIX) 40 MG tablet Take 40 mg by mouth 2 (two) times daily.     Allergies:   Patient has no known allergies.   Social History   Socioeconomic History  . Marital status: Married    Spouse name: Not on file  . Number of children: Not on file  . Years of education: Not on file  . Highest  education level: Not on file  Occupational History  . Not on file  Social Needs  . Financial resource strain: Not on file  . Food insecurity    Worry: Not on file    Inability: Not on file  . Transportation needs    Medical: Not on file    Non-medical: Not on file  Tobacco Use  . Smoking status: Former Smoker    Packs/day: 1.00    Years: 10.00    Pack years: 10.00    Types: Cigarettes  . Smokeless tobacco: Never Used  . Tobacco comment: quit 1974  Substance and Sexual Activity  . Alcohol use: No  . Drug use: No  . Sexual activity: Not on file  Lifestyle  . Physical activity    Days per week: Not on file    Minutes per session: Not on file  . Stress: Not on file  Relationships  . Social Herbalist on phone: Not on file    Gets together: Not on file    Attends religious service: Not on file    Active member of club or organization: Not on file    Attends meetings of clubs or organizations: Not on file    Relationship status: Not on file  Other Topics Concern  . Not on file  Social History Narrative  . Not on file     Family History: The patient's family history includes Heart attack in his father and mother; Heart disease in his father and mother. ROS:   Please see the history of present illness.    All 14 point review of systems negative except as described per history of present illness  EKGs/Labs/Other Studies Reviewed:      Recent Labs: 11/08/2019: BUN 23; Creatinine, Ser 1.64; Hemoglobin 15.2; NT-Pro BNP 534; Platelets 183; Potassium 4.9; Sodium 141; TSH 5.520  Recent Lipid Panel No results found for: CHOL, TRIG, HDL, CHOLHDL, VLDL, LDLCALC, LDLDIRECT  Physical Exam:    VS:  BP 112/60   Pulse (!) 55   Ht 5\' 9"  (1.753 m)   Wt 194 lb (88 kg)   SpO2 97%   BMI 28.65 kg/m     Wt Readings from Last 3 Encounters:  11/22/19 194 lb (88 kg)  11/08/19 196 lb (88.9 kg)  05/18/19 195 lb 12.8 oz (88.8 kg)     GEN:  Well nourished, well developed  in no acute distress HEENT: Normal NECK: No JVD; No carotid bruits LYMPHATICS: No lymphadenopathy CARDIAC: RRR, no murmurs, no rubs, no gallops RESPIRATORY:  Clear to auscultation without  rales, wheezing or rhonchi  ABDOMEN: Soft, non-tender, non-distended MUSCULOSKELETAL:  No edema; No deformity  SKIN: Warm and dry LOWER EXTREMITIES: no swelling NEUROLOGIC:  Alert and oriented x 3 PSYCHIATRIC:  Normal affect   ASSESSMENT:    1. Nonrheumatic mitral valve regurgitation   2. Coronary artery disease of native artery of native heart with stable angina pectoris ([place])   3. Essential hypertension   4. Bradycardia    PLAN:    In order of problems listed above:  1. Nonrheumatic mitral valve regurgitation.  Moderate based on last echocardiogram he will require repeated echocardiogram but first I would like to correct his thyroid. 2. Coronary disease stable without any symptoms. 3. Essential hypertension blood pressure well controlled continue present management. 4. Bradycardia plan as outlined above.  Will increase dose of Synthroid from 50 to 75 mcg daily and I see him back in the office within the next few weeks   Medication Adjustments/Labs and Tests Ordered: Current medicines are reviewed at length with the patient today.  Concerns regarding medicines are outlined above.  No orders of the defined types were placed in this encounter.  Medication changes: No orders of the defined types were placed in this encounter.   Signed, Park Liter, MD, [facility] 11/22/2019 11:10 AM    [place]

## 2019-11-22 NOTE — Patient Instructions (Signed)
Medication Instructions:  Your physician has recommended you make the following change in your medication:   INCREASE: Synthroid 75 mcg daily   *If you need a refill on your cardiac medications before your next appointment, please call your pharmacy*  Lab Work: None.  If you have labs (blood work) drawn today and your tests are completely normal, you will receive your results only by: Marland Kitchen MyChart Message (if you have MyChart) OR . A paper copy in the mail If you have any lab test that is abnormal or we need to change your treatment, we will call you to review the results.  Testing/Procedures: None.   Follow-Up: At [facility], you and your health needs are our priority.  As part of our continuing mission to provide you with exceptional heart care, we have created designated Provider Care Teams.  These Care Teams include your primary Cardiologist (physician) and Advanced Practice Providers (APPs -  Physician Assistants and Nurse Practitioners) who all work together to provide you with the care you need, when you need it.  Your next appointment:   6 week(s)  The format for your next appointment:   In Person  Provider:   Jenne Campus, MD  Other Instructions

## 2019-12-01 ENCOUNTER — Ambulatory Visit: Payer: Medicare Other | Admitting: Cardiology

## 2019-12-01 ENCOUNTER — Other Ambulatory Visit: Payer: Self-pay | Admitting: Cardiology

## 2019-12-05 ENCOUNTER — Other Ambulatory Visit: Payer: Self-pay | Admitting: Cardiology

## 2019-12-11 DIAGNOSIS — R0600 Dyspnea, unspecified: Secondary | ICD-10-CM | POA: Diagnosis not present

## 2019-12-11 DIAGNOSIS — U071 COVID-19: Secondary | ICD-10-CM | POA: Diagnosis not present

## 2019-12-11 DIAGNOSIS — J189 Pneumonia, unspecified organism: Secondary | ICD-10-CM | POA: Diagnosis not present

## 2019-12-12 DIAGNOSIS — J1289 Other viral pneumonia: Secondary | ICD-10-CM | POA: Diagnosis not present

## 2019-12-12 DIAGNOSIS — I10 Essential (primary) hypertension: Secondary | ICD-10-CM | POA: Diagnosis not present

## 2019-12-12 DIAGNOSIS — R0602 Shortness of breath: Secondary | ICD-10-CM | POA: Diagnosis not present

## 2019-12-12 DIAGNOSIS — Z743 Need for continuous supervision: Secondary | ICD-10-CM | POA: Diagnosis not present

## 2019-12-12 DIAGNOSIS — R110 Nausea: Secondary | ICD-10-CM | POA: Diagnosis not present

## 2019-12-12 DIAGNOSIS — U071 COVID-19: Secondary | ICD-10-CM | POA: Diagnosis not present

## 2019-12-12 DIAGNOSIS — E860 Dehydration: Secondary | ICD-10-CM | POA: Diagnosis not present

## 2019-12-18 DIAGNOSIS — R079 Chest pain, unspecified: Secondary | ICD-10-CM | POA: Diagnosis not present

## 2019-12-18 DIAGNOSIS — J1289 Other viral pneumonia: Secondary | ICD-10-CM | POA: Diagnosis not present

## 2019-12-18 DIAGNOSIS — U071 COVID-19: Secondary | ICD-10-CM

## 2019-12-18 DIAGNOSIS — E782 Mixed hyperlipidemia: Secondary | ICD-10-CM

## 2019-12-18 DIAGNOSIS — K219 Gastro-esophageal reflux disease without esophagitis: Secondary | ICD-10-CM

## 2019-12-18 DIAGNOSIS — E039 Hypothyroidism, unspecified: Secondary | ICD-10-CM

## 2019-12-18 DIAGNOSIS — R0602 Shortness of breath: Secondary | ICD-10-CM | POA: Diagnosis not present

## 2019-12-18 DIAGNOSIS — R7989 Other specified abnormal findings of blood chemistry: Secondary | ICD-10-CM | POA: Diagnosis not present

## 2019-12-18 DIAGNOSIS — R945 Abnormal results of liver function studies: Secondary | ICD-10-CM | POA: Diagnosis not present

## 2019-12-18 DIAGNOSIS — I252 Old myocardial infarction: Secondary | ICD-10-CM | POA: Diagnosis not present

## 2019-12-18 DIAGNOSIS — R05 Cough: Secondary | ICD-10-CM | POA: Diagnosis not present

## 2019-12-18 DIAGNOSIS — N179 Acute kidney failure, unspecified: Secondary | ICD-10-CM | POA: Diagnosis not present

## 2019-12-18 DIAGNOSIS — I10 Essential (primary) hypertension: Secondary | ICD-10-CM

## 2019-12-18 DIAGNOSIS — J9601 Acute respiratory failure with hypoxia: Secondary | ICD-10-CM | POA: Diagnosis not present

## 2019-12-18 DIAGNOSIS — Z7902 Long term (current) use of antithrombotics/antiplatelets: Secondary | ICD-10-CM | POA: Diagnosis not present

## 2019-12-18 DIAGNOSIS — M1990 Unspecified osteoarthritis, unspecified site: Secondary | ICD-10-CM | POA: Diagnosis not present

## 2019-12-18 DIAGNOSIS — I129 Hypertensive chronic kidney disease with stage 1 through stage 4 chronic kidney disease, or unspecified chronic kidney disease: Secondary | ICD-10-CM | POA: Diagnosis not present

## 2019-12-18 DIAGNOSIS — Z743 Need for continuous supervision: Secondary | ICD-10-CM | POA: Diagnosis not present

## 2019-12-18 DIAGNOSIS — R0902 Hypoxemia: Secondary | ICD-10-CM

## 2019-12-18 DIAGNOSIS — R509 Fever, unspecified: Secondary | ICD-10-CM | POA: Diagnosis not present

## 2019-12-18 DIAGNOSIS — N183 Chronic kidney disease, stage 3 unspecified: Secondary | ICD-10-CM

## 2019-12-18 DIAGNOSIS — I2510 Atherosclerotic heart disease of native coronary artery without angina pectoris: Secondary | ICD-10-CM

## 2019-12-18 DIAGNOSIS — R911 Solitary pulmonary nodule: Secondary | ICD-10-CM | POA: Diagnosis not present

## 2019-12-18 DIAGNOSIS — Z955 Presence of coronary angioplasty implant and graft: Secondary | ICD-10-CM | POA: Diagnosis not present

## 2019-12-23 DIAGNOSIS — Z7952 Long term (current) use of systemic steroids: Secondary | ICD-10-CM | POA: Diagnosis not present

## 2019-12-23 DIAGNOSIS — N183 Chronic kidney disease, stage 3 unspecified: Secondary | ICD-10-CM | POA: Diagnosis not present

## 2019-12-23 DIAGNOSIS — K219 Gastro-esophageal reflux disease without esophagitis: Secondary | ICD-10-CM | POA: Diagnosis not present

## 2019-12-23 DIAGNOSIS — R911 Solitary pulmonary nodule: Secondary | ICD-10-CM | POA: Diagnosis not present

## 2019-12-23 DIAGNOSIS — E782 Mixed hyperlipidemia: Secondary | ICD-10-CM | POA: Diagnosis not present

## 2019-12-23 DIAGNOSIS — I252 Old myocardial infarction: Secondary | ICD-10-CM | POA: Diagnosis not present

## 2019-12-23 DIAGNOSIS — Z7982 Long term (current) use of aspirin: Secondary | ICD-10-CM | POA: Diagnosis not present

## 2019-12-23 DIAGNOSIS — E039 Hypothyroidism, unspecified: Secondary | ICD-10-CM | POA: Diagnosis not present

## 2019-12-23 DIAGNOSIS — J9601 Acute respiratory failure with hypoxia: Secondary | ICD-10-CM | POA: Diagnosis not present

## 2019-12-23 DIAGNOSIS — K259 Gastric ulcer, unspecified as acute or chronic, without hemorrhage or perforation: Secondary | ICD-10-CM | POA: Diagnosis not present

## 2019-12-23 DIAGNOSIS — I129 Hypertensive chronic kidney disease with stage 1 through stage 4 chronic kidney disease, or unspecified chronic kidney disease: Secondary | ICD-10-CM | POA: Diagnosis not present

## 2019-12-23 DIAGNOSIS — Z9089 Acquired absence of other organs: Secondary | ICD-10-CM | POA: Diagnosis not present

## 2019-12-23 DIAGNOSIS — I2510 Atherosclerotic heart disease of native coronary artery without angina pectoris: Secondary | ICD-10-CM | POA: Diagnosis not present

## 2019-12-23 DIAGNOSIS — Z955 Presence of coronary angioplasty implant and graft: Secondary | ICD-10-CM | POA: Diagnosis not present

## 2019-12-23 DIAGNOSIS — N179 Acute kidney failure, unspecified: Secondary | ICD-10-CM | POA: Diagnosis not present

## 2019-12-26 DIAGNOSIS — I252 Old myocardial infarction: Secondary | ICD-10-CM | POA: Diagnosis not present

## 2019-12-26 DIAGNOSIS — E782 Mixed hyperlipidemia: Secondary | ICD-10-CM | POA: Diagnosis not present

## 2019-12-26 DIAGNOSIS — J9601 Acute respiratory failure with hypoxia: Secondary | ICD-10-CM | POA: Diagnosis not present

## 2019-12-26 DIAGNOSIS — I129 Hypertensive chronic kidney disease with stage 1 through stage 4 chronic kidney disease, or unspecified chronic kidney disease: Secondary | ICD-10-CM | POA: Diagnosis not present

## 2019-12-26 DIAGNOSIS — N179 Acute kidney failure, unspecified: Secondary | ICD-10-CM | POA: Diagnosis not present

## 2019-12-26 DIAGNOSIS — E039 Hypothyroidism, unspecified: Secondary | ICD-10-CM | POA: Diagnosis not present

## 2019-12-26 DIAGNOSIS — N183 Chronic kidney disease, stage 3 unspecified: Secondary | ICD-10-CM | POA: Diagnosis not present

## 2019-12-26 DIAGNOSIS — Z7982 Long term (current) use of aspirin: Secondary | ICD-10-CM | POA: Diagnosis not present

## 2019-12-26 DIAGNOSIS — K219 Gastro-esophageal reflux disease without esophagitis: Secondary | ICD-10-CM | POA: Diagnosis not present

## 2019-12-26 DIAGNOSIS — I2510 Atherosclerotic heart disease of native coronary artery without angina pectoris: Secondary | ICD-10-CM | POA: Diagnosis not present

## 2019-12-26 DIAGNOSIS — Z955 Presence of coronary angioplasty implant and graft: Secondary | ICD-10-CM | POA: Diagnosis not present

## 2019-12-26 DIAGNOSIS — Z9089 Acquired absence of other organs: Secondary | ICD-10-CM | POA: Diagnosis not present

## 2019-12-26 DIAGNOSIS — K259 Gastric ulcer, unspecified as acute or chronic, without hemorrhage or perforation: Secondary | ICD-10-CM | POA: Diagnosis not present

## 2019-12-26 DIAGNOSIS — Z7952 Long term (current) use of systemic steroids: Secondary | ICD-10-CM | POA: Diagnosis not present

## 2019-12-26 DIAGNOSIS — R911 Solitary pulmonary nodule: Secondary | ICD-10-CM | POA: Diagnosis not present

## 2020-01-01 DIAGNOSIS — U071 COVID-19: Secondary | ICD-10-CM | POA: Diagnosis not present

## 2020-01-02 DIAGNOSIS — K219 Gastro-esophageal reflux disease without esophagitis: Secondary | ICD-10-CM | POA: Diagnosis not present

## 2020-01-02 DIAGNOSIS — Z7952 Long term (current) use of systemic steroids: Secondary | ICD-10-CM | POA: Diagnosis not present

## 2020-01-02 DIAGNOSIS — Z7982 Long term (current) use of aspirin: Secondary | ICD-10-CM | POA: Diagnosis not present

## 2020-01-02 DIAGNOSIS — I2510 Atherosclerotic heart disease of native coronary artery without angina pectoris: Secondary | ICD-10-CM | POA: Diagnosis not present

## 2020-01-02 DIAGNOSIS — J9601 Acute respiratory failure with hypoxia: Secondary | ICD-10-CM | POA: Diagnosis not present

## 2020-01-02 DIAGNOSIS — K259 Gastric ulcer, unspecified as acute or chronic, without hemorrhage or perforation: Secondary | ICD-10-CM | POA: Diagnosis not present

## 2020-01-02 DIAGNOSIS — I252 Old myocardial infarction: Secondary | ICD-10-CM | POA: Diagnosis not present

## 2020-01-02 DIAGNOSIS — N179 Acute kidney failure, unspecified: Secondary | ICD-10-CM | POA: Diagnosis not present

## 2020-01-02 DIAGNOSIS — R911 Solitary pulmonary nodule: Secondary | ICD-10-CM | POA: Diagnosis not present

## 2020-01-02 DIAGNOSIS — Z955 Presence of coronary angioplasty implant and graft: Secondary | ICD-10-CM | POA: Diagnosis not present

## 2020-01-02 DIAGNOSIS — I129 Hypertensive chronic kidney disease with stage 1 through stage 4 chronic kidney disease, or unspecified chronic kidney disease: Secondary | ICD-10-CM | POA: Diagnosis not present

## 2020-01-02 DIAGNOSIS — N183 Chronic kidney disease, stage 3 unspecified: Secondary | ICD-10-CM | POA: Diagnosis not present

## 2020-01-02 DIAGNOSIS — Z9089 Acquired absence of other organs: Secondary | ICD-10-CM | POA: Diagnosis not present

## 2020-01-02 DIAGNOSIS — E039 Hypothyroidism, unspecified: Secondary | ICD-10-CM | POA: Diagnosis not present

## 2020-01-02 DIAGNOSIS — E782 Mixed hyperlipidemia: Secondary | ICD-10-CM | POA: Diagnosis not present

## 2020-01-03 ENCOUNTER — Ambulatory Visit: Payer: Medicare Other | Admitting: Cardiology

## 2020-01-03 DIAGNOSIS — I10 Essential (primary) hypertension: Secondary | ICD-10-CM | POA: Diagnosis not present

## 2020-01-03 DIAGNOSIS — U071 COVID-19: Secondary | ICD-10-CM | POA: Diagnosis not present

## 2020-01-03 DIAGNOSIS — Y708 Miscellaneous anesthesiology devices associated with adverse incidents, not elsewhere classified: Secondary | ICD-10-CM | POA: Diagnosis not present

## 2020-01-09 DIAGNOSIS — Z955 Presence of coronary angioplasty implant and graft: Secondary | ICD-10-CM | POA: Diagnosis not present

## 2020-01-09 DIAGNOSIS — N179 Acute kidney failure, unspecified: Secondary | ICD-10-CM | POA: Diagnosis not present

## 2020-01-09 DIAGNOSIS — R911 Solitary pulmonary nodule: Secondary | ICD-10-CM | POA: Diagnosis not present

## 2020-01-09 DIAGNOSIS — K219 Gastro-esophageal reflux disease without esophagitis: Secondary | ICD-10-CM | POA: Diagnosis not present

## 2020-01-09 DIAGNOSIS — Z7982 Long term (current) use of aspirin: Secondary | ICD-10-CM | POA: Diagnosis not present

## 2020-01-09 DIAGNOSIS — K259 Gastric ulcer, unspecified as acute or chronic, without hemorrhage or perforation: Secondary | ICD-10-CM | POA: Diagnosis not present

## 2020-01-09 DIAGNOSIS — N183 Chronic kidney disease, stage 3 unspecified: Secondary | ICD-10-CM | POA: Diagnosis not present

## 2020-01-09 DIAGNOSIS — Z7952 Long term (current) use of systemic steroids: Secondary | ICD-10-CM | POA: Diagnosis not present

## 2020-01-09 DIAGNOSIS — Z9089 Acquired absence of other organs: Secondary | ICD-10-CM | POA: Diagnosis not present

## 2020-01-09 DIAGNOSIS — E039 Hypothyroidism, unspecified: Secondary | ICD-10-CM | POA: Diagnosis not present

## 2020-01-09 DIAGNOSIS — I252 Old myocardial infarction: Secondary | ICD-10-CM | POA: Diagnosis not present

## 2020-01-09 DIAGNOSIS — I2510 Atherosclerotic heart disease of native coronary artery without angina pectoris: Secondary | ICD-10-CM | POA: Diagnosis not present

## 2020-01-09 DIAGNOSIS — I129 Hypertensive chronic kidney disease with stage 1 through stage 4 chronic kidney disease, or unspecified chronic kidney disease: Secondary | ICD-10-CM | POA: Diagnosis not present

## 2020-01-09 DIAGNOSIS — J9601 Acute respiratory failure with hypoxia: Secondary | ICD-10-CM | POA: Diagnosis not present

## 2020-01-09 DIAGNOSIS — E782 Mixed hyperlipidemia: Secondary | ICD-10-CM | POA: Diagnosis not present

## 2020-01-13 ENCOUNTER — Other Ambulatory Visit: Payer: Self-pay | Admitting: Cardiology

## 2020-01-13 DIAGNOSIS — E039 Hypothyroidism, unspecified: Secondary | ICD-10-CM | POA: Diagnosis not present

## 2020-01-13 DIAGNOSIS — E7800 Pure hypercholesterolemia, unspecified: Secondary | ICD-10-CM | POA: Diagnosis not present

## 2020-01-13 DIAGNOSIS — I10 Essential (primary) hypertension: Secondary | ICD-10-CM | POA: Diagnosis not present

## 2020-01-15 ENCOUNTER — Other Ambulatory Visit: Payer: Self-pay

## 2020-01-15 ENCOUNTER — Encounter: Payer: Self-pay | Admitting: Cardiology

## 2020-01-15 ENCOUNTER — Ambulatory Visit (INDEPENDENT_AMBULATORY_CARE_PROVIDER_SITE_OTHER): Payer: Medicare Other | Admitting: Cardiology

## 2020-01-15 VITALS — BP 128/60 | HR 60 | Ht 69.0 in | Wt 185.0 lb

## 2020-01-15 DIAGNOSIS — I25118 Atherosclerotic heart disease of native coronary artery with other forms of angina pectoris: Secondary | ICD-10-CM

## 2020-01-15 DIAGNOSIS — E210 Primary hyperparathyroidism: Secondary | ICD-10-CM

## 2020-01-15 DIAGNOSIS — R001 Bradycardia, unspecified: Secondary | ICD-10-CM

## 2020-01-15 DIAGNOSIS — I340 Nonrheumatic mitral (valve) insufficiency: Secondary | ICD-10-CM

## 2020-01-15 DIAGNOSIS — I10 Essential (primary) hypertension: Secondary | ICD-10-CM

## 2020-01-15 NOTE — Patient Instructions (Signed)
Medication Instructions:  Your physician recommends that you continue on your current medications as directed. Please refer to the Current Medication list given to you today.  *If you need a refill on your cardiac medications before your next appointment, please call your pharmacy*  Lab Work: Your physician recommends that you return for lab work when you com for echocardiogram: tsh   If you have labs (blood work) drawn today and your tests are completely normal, you will receive your results only by: Marland Kitchen MyChart Message (if you have MyChart) OR . A paper copy in the mail If you have any lab test that is abnormal or we need to change your treatment, we will call you to review the results.  Testing/Procedures: Your physician has requested that you have an echocardiogram. Echocardiography is a painless test that uses sound waves to create images of your heart. It provides your doctor with information about the size and shape of your heart and how well your heart's chambers and valves are working. This procedure takes approximately one hour. There are no restrictions for this procedure.    Follow-Up: At [facility], you and your health needs are our priority.  As part of our continuing mission to provide you with exceptional heart care, we have created designated Provider Care Teams.  These Care Teams include your primary Cardiologist (physician) and Advanced Practice Providers (APPs -  Physician Assistants and Nurse Practitioners) who all work together to provide you with the care you need, when you need it.  Your next appointment:   2 month(s)  The format for your next appointment:   In Person  Provider:   Jenne Campus, MD  Other Instructions   Echocardiogram An echocardiogram is a procedure that uses painless sound waves (ultrasound) to produce an image of the heart. Images from an echocardiogram can provide important information about:  Signs of coronary artery disease  (CAD).  Aneurysm detection. An aneurysm is a weak or damaged part of an artery wall that bulges out from the normal force of blood pumping through the body.  Heart size and shape. Changes in the size or shape of the heart can be associated with certain conditions, including heart failure, aneurysm, and CAD.  Heart muscle function.  Heart valve function.  Signs of a past heart attack.  Fluid buildup around the heart.  Thickening of the heart muscle.  A tumor or infectious growth around the heart valves. Tell a health care provider about:  Any allergies you have.  All medicines you are taking, including vitamins, herbs, eye drops, creams, and over-the-counter medicines.  Any blood disorders you have.  Any surgeries you have had.  Any medical conditions you have.  Whether you are pregnant or may be pregnant. What are the risks? Generally, this is a safe procedure. However, problems may occur, including:  Allergic reaction to dye (contrast) that may be used during the procedure. What happens before the procedure? No specific preparation is needed. You may eat and drink normally. What happens during the procedure?   An IV tube may be inserted into one of your veins.  You may receive contrast through this tube. A contrast is an injection that improves the quality of the pictures from your heart.  A gel will be applied to your chest.  A wand-like tool (transducer) will be moved over your chest. The gel will help to transmit the sound waves from the transducer.  The sound waves will harmlessly bounce off of your heart to  allow the heart images to be captured in real-time motion. The images will be recorded on a computer. The procedure may vary among health care providers and hospitals. What happens after the procedure?  You may return to your normal, everyday life, including diet, activities, and medicines, unless your health care provider tells you not to do  that. Summary  An echocardiogram is a procedure that uses painless sound waves (ultrasound) to produce an image of the heart.  Images from an echocardiogram can provide important information about the size and shape of your heart, heart muscle function, heart valve function, and fluid buildup around your heart.  You do not need to do anything to prepare before this procedure. You may eat and drink normally.  After the echocardiogram is completed, you may return to your normal, everyday life, unless your health care provider tells you not to do that. This information is not intended to replace advice given to you by your health care provider. Make sure you discuss any questions you have with your health care provider. Document Revised: 03/23/2019 Document Reviewed: 01/02/2017 Elsevier Patient Education  [place].

## 2020-01-15 NOTE — Progress Notes (Signed)
Cardiology Office Note:    Date:  01/15/2020   ID:  Ryan Mendoza, DOB [birth year], MRN [id number]  PCP:  Angelina Sheriff, MD  Cardiologist:  Jenne Campus, MD    Referring MD: Angelina Sheriff, MD   Chief Complaint  Patient presents with  . Follow-up    6 WK FU     History of Present Illness:    Ryan Mendoza is a 86 y.o. male with past medical history significant for coronary artery disease, no recent travel from that hospital, sinus bradycardia, discontinuation of beta-blocker, hypothyroidism with correction of his hypothyroidism.  He also does have moderate mitral regurgitation.  Recently he ended up having COVID-19 infection required for this hospitalization but recovered quite nicely from it.  He said he got his strength back and doing overall better.  Denies having any unusual shortness of breath there is no chest pain tightness squeezing pressure burning chest.  Past Medical History:  Diagnosis Date  . Anxiety   . Aortic valve regurgitation   . Arthritis    hands and knees  . BPH (benign prostatic hyperplasia)   . Cataract, left eye   . Claustrophobia   . Coronary artery disease   . Depression    no current meds for  . Dyspnea    DOE  . Elevated cholesterol   . Gallstones   . GERD (gastroesophageal reflux disease)   . History of kidney stones   . Hypertension   . Hypothyroidism   . Myocardial infarction ([place])    SILENT  . Primary hyperparathyroidism ([place])   . 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 Tinnitus    both ears all the time    Past Surgical History:  Procedure Laterality Date  . CATARACT EXTRACTION W/PHACO Left 04/12/2018   Procedure: CATARACT EXTRACTION PHACO AND INTRAOCULAR LENS PLACEMENT (IOC);  Surgeon: Birder Robson, MD;  Location: [facility] ORS;  Service: Ophthalmology;  Laterality: Left;  [place] 00:39.5 AP% 16.7 CDE 6.62 Fluid Pack Lot # [id number]  . CORONARY ANGIOPLASTY     2017  . EYE  SURGERY Right    ioc for cataract  . PARATHYROIDECTOMY Right 09/28/2017   Procedure: RIGHT INFERIOR PARATHYROIDECTOMY;  Surgeon: Armandina Gemma, MD;  Location: [facility] ORS;  Service: General;  Laterality: Right;  . right eye  plug for tear duct surgery    . stents to heart x 2  06/2016    Current Medications: Current Meds  Medication Sig  . ALPRAZolam (XANAX) 0.25 MG tablet Take 1 tablet by mouth at bedtime.  Marland Kitchen amLODipine (NORVASC) 5 MG tablet TAKE 1 TABLET BY MOUTH EVERYDAY AT BEDTIME  . aspirin EC 81 MG tablet Take 81 mg by mouth at bedtime.   . cloNIDine (CATAPRES) 0.1 MG tablet Take 0.1 mg by mouth 2 (two) times daily with a meal.   . dexamethasone (DECADRON) 1 MG tablet Take 1 mg by mouth daily. Take 3 Tablets Daily by mouth for 5 days.  Marland Kitchen escitalopram (LEXAPRO) 10 MG tablet Take 10 mg by mouth daily.  . furosemide (LASIX) 20 MG tablet Take 1 tablet (20 mg total) by mouth daily.  Marland Kitchen levothyroxine (SYNTHROID) 75 MCG tablet TAKE 1 TABLET (75 MCG TOTAL) BY MOUTH AT BEDTIME.  . nitroGLYCERIN (NITROSTAT) 0.4 MG SL tablet Place 1 tablet (0.4 mg total) under the tongue every 5 (five) minutes as needed for chest pain.  Marland Kitchen ondansetron (ZOFRAN) 4 MG  tablet Take 4 mg by mouth 3 (three) times daily. Take 2 tablets TID as needed for nausea and vomiting.  . pantoprazole (PROTONIX) 40 MG tablet Take 40 mg by mouth 2 (two) times daily.     Allergies:   Patient has no known allergies.   Social History   Socioeconomic History  . Marital status: Married    Spouse name: Not on file  . Number of children: Not on file  . Years of education: Not on file  . Highest education level: Not on file  Occupational History  . Not on file  Tobacco Use  . Smoking status: Former Smoker    Packs/day: 1.00    Years: 10.00    Pack years: 10.00    Types: Cigarettes  . Smokeless tobacco: Never Used  . Tobacco comment: quit 1974  Substance and Sexual Activity  . Alcohol use: No  . Drug use: No  . Sexual activity:  Not on file  Other Topics Concern  . Not on file  Social History Narrative  . Not on file   Social Determinants of Health   Financial Resource Strain:   . Difficulty of Paying Living Expenses: Not on file  Food Insecurity:   . Worried About Charity fundraiser in the Last Year: Not on file  . Ran Out of Food in the Last Year: Not on file  Transportation Needs:   . Lack of Transportation (Medical): Not on file  . Lack of Transportation (Non-Medical): Not on file  Physical Activity:   . Days of Exercise per Week: Not on file  . Minutes of Exercise per Session: Not on file  Stress:   . Feeling of Stress : Not on file  Social Connections:   . Frequency of Communication with Friends and Family: Not on file  . Frequency of Social Gatherings with Friends and Family: Not on file  . Attends Religious Services: Not on file  . Active Member of Clubs or Organizations: Not on file  . Attends Archivist Meetings: Not on file  . Marital Status: Not on file     Family History: The patient's family history includes Heart attack in his father and mother; Heart disease in his father and mother. ROS:   Please see the history of present illness.    All 14 point review of systems negative except as described per history of present illness  EKGs/Labs/Other Studies Reviewed:      Recent Labs: 11/08/2019: BUN 23; Creatinine, Ser 1.64; Hemoglobin 15.2; NT-Pro BNP 534; Platelets 183; Potassium 4.9; Sodium 141; TSH 5.520  Recent Lipid Panel No results found for: CHOL, TRIG, HDL, CHOLHDL, VLDL, LDLCALC, LDLDIRECT  Physical Exam:    VS:  BP 128/60   Pulse 60   Ht 5\' 9"  (1.753 m)   Wt 185 lb (83.9 kg)   SpO2 98%   BMI 27.32 kg/m     Wt Readings from Last 3 Encounters:  01/15/20 185 lb (83.9 kg)  11/22/19 194 lb (88 kg)  11/08/19 196 lb (88.9 kg)     GEN:  Well nourished, well developed in no acute distress HEENT: Normal NECK: No JVD; No carotid bruits LYMPHATICS: No  lymphadenopathy CARDIAC: RRR, n holosystolic murmur grade 2/6 best heard at the left border of the sternum, no rubs, no gallops RESPIRATORY:  Clear to auscultation without rales, wheezing or rhonchi  ABDOMEN: Soft, non-tender, non-distended MUSCULOSKELETAL:  No edema; No deformity  SKIN: Warm and dry LOWER EXTREMITIES: no swelling  NEUROLOGIC:  Alert and oriented x 3 PSYCHIATRIC:  Normal affect   ASSESSMENT:    1. Hyperparathyroidism, primary ([place])   2. Nonrheumatic mitral valve regurgitation   3. Essential hypertension   4. Coronary artery disease of native artery of native heart with stable angina pectoris ([place])   5. Bradycardia    PLAN:    In order of problems listed above:  1. Bradycardia improved.  We will continue monitoring him.  Within next few weeks we will put monitor on to see if there is any significant bradycardia.  He is asymptomatic 2. Nonrheumatic mitral valve regurgitation echocardiogram be scheduled again. 3. Essential hypertension blood pressure controlled 4. Coronary disease without any symptoms right now.   Medication Adjustments/Labs and Tests Ordered: Current medicines are reviewed at length with the patient today.  Concerns regarding medicines are outlined above.  No orders of the defined types were placed in this encounter.  Medication changes: No orders of the defined types were placed in this encounter.   Signed, Park Liter, MD, [facility] 01/15/2020 12:56 PM    [place]

## 2020-01-25 DIAGNOSIS — Z9089 Acquired absence of other organs: Secondary | ICD-10-CM | POA: Diagnosis not present

## 2020-01-25 DIAGNOSIS — R911 Solitary pulmonary nodule: Secondary | ICD-10-CM | POA: Diagnosis not present

## 2020-01-25 DIAGNOSIS — I129 Hypertensive chronic kidney disease with stage 1 through stage 4 chronic kidney disease, or unspecified chronic kidney disease: Secondary | ICD-10-CM | POA: Diagnosis not present

## 2020-01-25 DIAGNOSIS — I2510 Atherosclerotic heart disease of native coronary artery without angina pectoris: Secondary | ICD-10-CM | POA: Diagnosis not present

## 2020-01-25 DIAGNOSIS — K259 Gastric ulcer, unspecified as acute or chronic, without hemorrhage or perforation: Secondary | ICD-10-CM | POA: Diagnosis not present

## 2020-01-25 DIAGNOSIS — Z7982 Long term (current) use of aspirin: Secondary | ICD-10-CM | POA: Diagnosis not present

## 2020-01-25 DIAGNOSIS — J9601 Acute respiratory failure with hypoxia: Secondary | ICD-10-CM | POA: Diagnosis not present

## 2020-01-25 DIAGNOSIS — E782 Mixed hyperlipidemia: Secondary | ICD-10-CM | POA: Diagnosis not present

## 2020-01-25 DIAGNOSIS — Z955 Presence of coronary angioplasty implant and graft: Secondary | ICD-10-CM | POA: Diagnosis not present

## 2020-01-25 DIAGNOSIS — N179 Acute kidney failure, unspecified: Secondary | ICD-10-CM | POA: Diagnosis not present

## 2020-01-25 DIAGNOSIS — E039 Hypothyroidism, unspecified: Secondary | ICD-10-CM | POA: Diagnosis not present

## 2020-01-25 DIAGNOSIS — Z7952 Long term (current) use of systemic steroids: Secondary | ICD-10-CM | POA: Diagnosis not present

## 2020-01-25 DIAGNOSIS — N183 Chronic kidney disease, stage 3 unspecified: Secondary | ICD-10-CM | POA: Diagnosis not present

## 2020-01-25 DIAGNOSIS — K219 Gastro-esophageal reflux disease without esophagitis: Secondary | ICD-10-CM | POA: Diagnosis not present

## 2020-01-25 DIAGNOSIS — I252 Old myocardial infarction: Secondary | ICD-10-CM | POA: Diagnosis not present

## 2020-02-02 ENCOUNTER — Telehealth: Payer: Self-pay | Admitting: Cardiology

## 2020-02-02 MED ORDER — CLONIDINE HCL 0.1 MG PO TABS: 0.1000 mg | ORAL_TABLET | Freq: Two times a day (BID) | ORAL | 5 refills | Status: DC

## 2020-02-02 NOTE — Telephone Encounter (Signed)
Pt's medication was refilled and sent to pt's pharmacy. Confirmation received.

## 2020-02-02 NOTE — Telephone Encounter (Signed)
This medication has never been prescribed by you. Please advise if refill is appropriate. Thank you  Clonidine 0.1 mg. BID

## 2020-02-04 ENCOUNTER — Other Ambulatory Visit: Payer: Self-pay | Admitting: Cardiology

## 2020-02-07 ENCOUNTER — Telehealth: Payer: Self-pay | Admitting: Cardiology

## 2020-02-07 ENCOUNTER — Other Ambulatory Visit: Payer: Self-pay | Admitting: *Deleted

## 2020-02-07 DIAGNOSIS — E782 Mixed hyperlipidemia: Secondary | ICD-10-CM | POA: Diagnosis not present

## 2020-02-07 DIAGNOSIS — R911 Solitary pulmonary nodule: Secondary | ICD-10-CM | POA: Diagnosis not present

## 2020-02-07 DIAGNOSIS — I252 Old myocardial infarction: Secondary | ICD-10-CM | POA: Diagnosis not present

## 2020-02-07 DIAGNOSIS — Z7982 Long term (current) use of aspirin: Secondary | ICD-10-CM | POA: Diagnosis not present

## 2020-02-07 DIAGNOSIS — Z7952 Long term (current) use of systemic steroids: Secondary | ICD-10-CM | POA: Diagnosis not present

## 2020-02-07 DIAGNOSIS — I129 Hypertensive chronic kidney disease with stage 1 through stage 4 chronic kidney disease, or unspecified chronic kidney disease: Secondary | ICD-10-CM | POA: Diagnosis not present

## 2020-02-07 DIAGNOSIS — Z955 Presence of coronary angioplasty implant and graft: Secondary | ICD-10-CM | POA: Diagnosis not present

## 2020-02-07 DIAGNOSIS — K259 Gastric ulcer, unspecified as acute or chronic, without hemorrhage or perforation: Secondary | ICD-10-CM | POA: Diagnosis not present

## 2020-02-07 DIAGNOSIS — K219 Gastro-esophageal reflux disease without esophagitis: Secondary | ICD-10-CM | POA: Diagnosis not present

## 2020-02-07 DIAGNOSIS — Z9089 Acquired absence of other organs: Secondary | ICD-10-CM | POA: Diagnosis not present

## 2020-02-07 DIAGNOSIS — N183 Chronic kidney disease, stage 3 unspecified: Secondary | ICD-10-CM | POA: Diagnosis not present

## 2020-02-07 DIAGNOSIS — J9601 Acute respiratory failure with hypoxia: Secondary | ICD-10-CM | POA: Diagnosis not present

## 2020-02-07 DIAGNOSIS — N179 Acute kidney failure, unspecified: Secondary | ICD-10-CM | POA: Diagnosis not present

## 2020-02-07 DIAGNOSIS — E039 Hypothyroidism, unspecified: Secondary | ICD-10-CM | POA: Diagnosis not present

## 2020-02-07 DIAGNOSIS — I2510 Atherosclerotic heart disease of native coronary artery without angina pectoris: Secondary | ICD-10-CM | POA: Diagnosis not present

## 2020-02-07 MED ORDER — NITROGLYCERIN 0.4 MG SL SUBL: 0.4000 mg | SUBLINGUAL_TABLET | SUBLINGUAL | 3 refills | Status: DC | PRN

## 2020-02-07 NOTE — Telephone Encounter (Signed)
New message   Per Jeani Hawking states that the patient is having episodes of Bradycardia. Please call Jeani Hawking to discuss.

## 2020-02-07 NOTE — Telephone Encounter (Signed)
Called patient. He will come to office tomorrow for ekg and labs and office visit.

## 2020-02-07 NOTE — Telephone Encounter (Signed)
Refill sent.

## 2020-02-07 NOTE — Telephone Encounter (Signed)
Please bring him to the office for EKG, also please check TSH.

## 2020-02-07 NOTE — Telephone Encounter (Signed)
New Message    *STAT* If patient is at the pharmacy, call can be transferred to refill team.   1. Which medications need to be refilled? (please list name of each medication and dose if known) nitroGLYCERIN (NITROSTAT) 0.4 MG SL tablet  2. Which pharmacy/location (including street and city if local pharmacy) is medication to be sent to? [id number] - [place]  3. Do they need a 30 day or 90 day supply? [place]

## 2020-02-07 NOTE — Telephone Encounter (Signed)
Returned call to [facility] nurse.  Per [facility] nurse over the last week Pt has c/o of feeling bad-weakness, palpitations, dizzy, sob, with chest pressure at times.  Per Pt his pulse when he checks it is frequently in the 40's with weakness, etc.  Per [facility] nurse she checked Pt's HR just now and it is in the 40's.  Advised would forward to Dr. Raliegh Ip to advise on increasingly symptomatic bradycardia.

## 2020-02-08 ENCOUNTER — Other Ambulatory Visit: Payer: Self-pay

## 2020-02-08 ENCOUNTER — Ambulatory Visit: Payer: Medicare Other | Admitting: Cardiology

## 2020-02-08 ENCOUNTER — Encounter: Payer: Self-pay | Admitting: Cardiology

## 2020-02-08 VITALS — BP 138/62 | HR 88 | Ht 69.0 in | Wt 181.1 lb

## 2020-02-08 DIAGNOSIS — R0600 Dyspnea, unspecified: Secondary | ICD-10-CM | POA: Diagnosis not present

## 2020-02-08 DIAGNOSIS — R0609 Other forms of dyspnea: Secondary | ICD-10-CM

## 2020-02-08 DIAGNOSIS — I10 Essential (primary) hypertension: Secondary | ICD-10-CM | POA: Diagnosis not present

## 2020-02-08 DIAGNOSIS — I25118 Atherosclerotic heart disease of native coronary artery with other forms of angina pectoris: Secondary | ICD-10-CM | POA: Diagnosis not present

## 2020-02-08 DIAGNOSIS — R001 Bradycardia, unspecified: Secondary | ICD-10-CM | POA: Diagnosis not present

## 2020-02-08 DIAGNOSIS — I340 Nonrheumatic mitral (valve) insufficiency: Secondary | ICD-10-CM

## 2020-02-08 NOTE — Progress Notes (Signed)
Cardiology Office Note:    Date:  02/08/2020   ID:  DOLPHUS CIRRITO, DOB July 17, 1934, MRN [id number]  PCP:  Angelina Sheriff, MD  Cardiologist:  Jenne Campus, MD    Referring MD: Angelina Sheriff, MD   No chief complaint on file. I have dizziness  History of Present Illness:    GRAEME HIATT is a 86 y.o. male with moderate mitral regurgitation, also some extrasystole, essential hypertension, hypothyroidism, remote coronary artery disease.  He came to me because home health nurse who came to see him noted that his heart rate was 40 and he felt lousy.  He said it happens from time to time he gets sensation of being very weak tired exhausted not able to do anything he noticed that when he checked his heart rate pulse oximetry he usually see heart rate of 40.  Did last for few hours and then then gets better and he is completely asymptomatic.  He came to [place] to be evaluated for this he is doing well today his heart rate is fine his EKG showed normal sinus rhythm, normal P interval left axis deviation, criteria for LVH nonspecific ST segment changes.  Denies having any chest pain tightness squeezing pressure burning chest no passing out  Past Medical History:  Diagnosis Date  . Anxiety   . Aortic valve regurgitation   . Arthritis    hands and knees  . BPH (benign prostatic hyperplasia)   . Cataract, left eye   . Claustrophobia   . Coronary artery disease   . Depression    no current meds for  . Dyspnea    DOE  . Elevated cholesterol   . Gallstones   . GERD (gastroesophageal reflux disease)   . History of kidney stones   . Hypertension   . Hypothyroidism   . Myocardial infarction ([place])    SILENT  . Primary hyperparathyroidism ([place])   . 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 Tinnitus    both ears all the time    Past Surgical History:  Procedure Laterality Date  . CATARACT EXTRACTION W/PHACO Left 04/12/2018   Procedure:  CATARACT EXTRACTION PHACO AND INTRAOCULAR LENS PLACEMENT (IOC);  Surgeon: Birder Robson, MD;  Location: [facility] ORS;  Service: Ophthalmology;  Laterality: Left;  [place] 00:39.5 AP% 16.7 CDE 6.62 Fluid Pack Lot # [id number]  . CORONARY ANGIOPLASTY     2017  . EYE SURGERY Right    ioc for cataract  . PARATHYROIDECTOMY Right 09/28/2017   Procedure: RIGHT INFERIOR PARATHYROIDECTOMY;  Surgeon: Armandina Gemma, MD;  Location: [facility] ORS;  Service: General;  Laterality: Right;  . right eye  plug for tear duct surgery    . stents to heart x 2  06/2016    Current Medications: Current Meds  Medication Sig  . ALPRAZolam (XANAX) 0.25 MG tablet Take 1 tablet by mouth at bedtime.  Marland Kitchen amLODipine (NORVASC) 5 MG tablet TAKE 1 TABLET BY MOUTH EVERYDAY AT BEDTIME  . aspirin EC 81 MG tablet Take 81 mg by mouth at bedtime.   Marland Kitchen atorvastatin (LIPITOR) 40 MG tablet Take 40 mg by mouth at bedtime.   . cloNIDine (CATAPRES) 0.1 MG tablet Take 1 tablet (0.1 mg total) by mouth 2 (two) times daily with a meal.  . escitalopram (LEXAPRO) 10 MG tablet Take 10 mg by mouth daily.  . furosemide (LASIX) 20 MG tablet TAKE 1 TABLET BY MOUTH EVERY  DAY  . levothyroxine (SYNTHROID) 75 MCG tablet TAKE 1 TABLET (75 MCG TOTAL) BY MOUTH AT BEDTIME.  . nitroGLYCERIN (NITROSTAT) 0.4 MG SL tablet Place 1 tablet (0.4 mg total) under the tongue every 5 (five) minutes as needed for chest pain.  . pantoprazole (PROTONIX) 40 MG tablet Take 40 mg by mouth 2 (two) times daily.     Allergies:   Patient has no known allergies.   Social History   Socioeconomic History  . Marital status: Married    Spouse name: Not on file  . Number of children: Not on file  . Years of education: Not on file  . Highest education level: Not on file  Occupational History  . Not on file  Tobacco Use  . Smoking status: Former Smoker    Packs/day: 1.00    Years: 10.00    Pack years: 10.00    Types: Cigarettes  . Smokeless tobacco: Never Used  . Tobacco comment:  quit 1974  Substance and Sexual Activity  . Alcohol use: No  . Drug use: No  . Sexual activity: Not on file  Other Topics Concern  . Not on file  Social History Narrative  . Not on file   Social Determinants of Health   Financial Resource Strain:   . Difficulty of Paying Living Expenses: Not on file  Food Insecurity:   . Worried About Charity fundraiser in the Last Year: Not on file  . Ran Out of Food in the Last Year: Not on file  Transportation Needs:   . Lack of Transportation (Medical): Not on file  . Lack of Transportation (Non-Medical): Not on file  Physical Activity:   . Days of Exercise per Week: Not on file  . Minutes of Exercise per Session: Not on file  Stress:   . Feeling of Stress : Not on file  Social Connections:   . Frequency of Communication with Friends and Family: Not on file  . Frequency of Social Gatherings with Friends and Family: Not on file  . Attends Religious Services: Not on file  . Active Member of Clubs or Organizations: Not on file  . Attends Archivist Meetings: Not on file  . Marital Status: Not on file     Family History: The patient's family history includes Heart attack in his father and mother; Heart disease in his father and mother. ROS:   Please see the history of present illness.    All 14 point review of systems negative except as described per history of present illness  EKGs/Labs/Other Studies Reviewed:      Recent Labs: 11/08/2019: BUN 23; Creatinine, Ser 1.64; Hemoglobin 15.2; NT-Pro BNP 534; Platelets 183; Potassium 4.9; Sodium 141; TSH 5.520  Recent Lipid Panel No results found for: CHOL, TRIG, HDL, CHOLHDL, VLDL, LDLCALC, LDLDIRECT  Physical Exam:    VS:  BP 138/62   Pulse 88   Ht 5\' 9"  (1.753 m)   Wt 181 lb 1.9 oz (82.2 kg)   SpO2 96%   BMI 26.75 kg/m     Wt Readings from Last 3 Encounters:  02/08/20 181 lb 1.9 oz (82.2 kg)  01/15/20 185 lb (83.9 kg)  11/22/19 194 lb (88 kg)     GEN:  Well  nourished, well developed in no acute distress HEENT: Normal NECK: No JVD; No carotid bruits LYMPHATICS: No lymphadenopathy CARDIAC: RRR, no murmurs, no rubs, no gallops RESPIRATORY:  Clear to auscultation without rales, wheezing or rhonchi  ABDOMEN: Soft, non-tender,  non-distended MUSCULOSKELETAL:  No edema; No deformity  SKIN: Warm and dry LOWER EXTREMITIES: no swelling NEUROLOGIC:  Alert and oriented x 3 PSYCHIATRIC:  Normal affect   ASSESSMENT:    1. Nonrheumatic mitral valve regurgitation   2. Coronary artery disease of native artery of native heart with stable angina pectoris ([place])   3. Essential hypertension   4. Dyspnea on exertion   5. Bradycardia    PLAN:    In order of problems listed above:  1. Bradycardia with feeling very weak and tired.  EKG today showed normal sinus rhythm.  All AV blocking agent has been withdrawn, recent recognition of hypothyroidism resulted in increasing dose of Synthroid.  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 2. Coronary disease the stable we will continue present management. 3. Essential hypertension blood pressure appears to be well controlled continue present management. 4. Dyspnea on exertion he is awaiting for echocardiogram which will be done in March. 5. Bradycardia.  Today his heart rate is 88.  But we will put monitor on.   Medication Adjustments/Labs and Tests Ordered: Current medicines are reviewed at length with the patient today.  Concerns regarding medicines are outlined above.  No orders of the defined types were placed in this encounter.  Medication changes: No orders of the defined types were placed in this encounter.   Signed, Park Liter, MD, [facility] 02/08/2020 4:44 PM    [place]

## 2020-02-08 NOTE — Patient Instructions (Signed)
Medication Instructions:  Your physician recommends that you continue on your current medications as directed. Please refer to the Current Medication list given to you today.  *If you need a refill on your cardiac medications before your next appointment, please call your pharmacy*   Lab Work: None If you have labs (blood work) drawn today and your tests are completely normal, you will receive your results only by: . MyChart Message (if you have MyChart) OR . A paper copy in the mail If you have any lab test that is abnormal or we need to change your treatment, we will call you to review the results.   Testing/Procedures: A zio monitor was ordered today. It will remain on for 7 days. You will then return monitor and event diary in provided box. It takes 1-2 weeks for report to be downloaded and returned to us. We will call you with the results. If monitor falls off or has orange flashing light, please call Zio for further instructions.      Follow-Up: At [facility], you and your health needs are our priority.  As part of our continuing mission to provide you with exceptional heart care, we have created designated Provider Care Teams.  These Care Teams include your primary Cardiologist (physician) and Advanced Practice Providers (APPs -  Physician Assistants and Nurse Practitioners) who all work together to provide you with the care you need, when you need it.  We recommend signing up for the patient portal called "MyChart".  Sign up information is provided on this After Visit Summary.  MyChart is used to connect with patients for Virtual Visits (Telemedicine).  Patients are able to view lab/test results, encounter notes, upcoming appointments, etc.  Non-urgent messages can be sent to your provider as well.   To learn more about what you can do with MyChart, go to [email or web address].    Your next appointment:   1 month(s)  The format for your next appointment:   In  Person  Provider:   Robert Krasowski, MD   Other Instructions   

## 2020-02-08 NOTE — Addendum Note (Signed)
Addended by: Ashok Norris on: 02/08/2020 04:52 PM   Modules accepted: Orders

## 2020-02-09 ENCOUNTER — Ambulatory Visit (INDEPENDENT_AMBULATORY_CARE_PROVIDER_SITE_OTHER): Payer: Medicare Other

## 2020-02-09 DIAGNOSIS — R001 Bradycardia, unspecified: Secondary | ICD-10-CM

## 2020-02-12 ENCOUNTER — Encounter: Payer: Self-pay | Admitting: Cardiology

## 2020-02-15 DIAGNOSIS — N179 Acute kidney failure, unspecified: Secondary | ICD-10-CM | POA: Diagnosis not present

## 2020-02-15 DIAGNOSIS — Z955 Presence of coronary angioplasty implant and graft: Secondary | ICD-10-CM | POA: Diagnosis not present

## 2020-02-15 DIAGNOSIS — I2510 Atherosclerotic heart disease of native coronary artery without angina pectoris: Secondary | ICD-10-CM | POA: Diagnosis not present

## 2020-02-15 DIAGNOSIS — I252 Old myocardial infarction: Secondary | ICD-10-CM | POA: Diagnosis not present

## 2020-02-15 DIAGNOSIS — Z7982 Long term (current) use of aspirin: Secondary | ICD-10-CM | POA: Diagnosis not present

## 2020-02-15 DIAGNOSIS — Z9089 Acquired absence of other organs: Secondary | ICD-10-CM | POA: Diagnosis not present

## 2020-02-15 DIAGNOSIS — Z7952 Long term (current) use of systemic steroids: Secondary | ICD-10-CM | POA: Diagnosis not present

## 2020-02-15 DIAGNOSIS — K259 Gastric ulcer, unspecified as acute or chronic, without hemorrhage or perforation: Secondary | ICD-10-CM | POA: Diagnosis not present

## 2020-02-15 DIAGNOSIS — I129 Hypertensive chronic kidney disease with stage 1 through stage 4 chronic kidney disease, or unspecified chronic kidney disease: Secondary | ICD-10-CM | POA: Diagnosis not present

## 2020-02-15 DIAGNOSIS — K219 Gastro-esophageal reflux disease without esophagitis: Secondary | ICD-10-CM | POA: Diagnosis not present

## 2020-02-15 DIAGNOSIS — E782 Mixed hyperlipidemia: Secondary | ICD-10-CM | POA: Diagnosis not present

## 2020-02-15 DIAGNOSIS — R911 Solitary pulmonary nodule: Secondary | ICD-10-CM | POA: Diagnosis not present

## 2020-02-15 DIAGNOSIS — N183 Chronic kidney disease, stage 3 unspecified: Secondary | ICD-10-CM | POA: Diagnosis not present

## 2020-02-15 DIAGNOSIS — E039 Hypothyroidism, unspecified: Secondary | ICD-10-CM | POA: Diagnosis not present

## 2020-02-15 DIAGNOSIS — J9601 Acute respiratory failure with hypoxia: Secondary | ICD-10-CM | POA: Diagnosis not present

## 2020-02-18 DIAGNOSIS — R002 Palpitations: Secondary | ICD-10-CM | POA: Diagnosis not present

## 2020-02-18 DIAGNOSIS — Z79899 Other long term (current) drug therapy: Secondary | ICD-10-CM | POA: Diagnosis not present

## 2020-02-18 DIAGNOSIS — I129 Hypertensive chronic kidney disease with stage 1 through stage 4 chronic kidney disease, or unspecified chronic kidney disease: Secondary | ICD-10-CM | POA: Diagnosis not present

## 2020-02-18 DIAGNOSIS — R079 Chest pain, unspecified: Secondary | ICD-10-CM | POA: Diagnosis not present

## 2020-02-18 DIAGNOSIS — R0602 Shortness of breath: Secondary | ICD-10-CM | POA: Diagnosis not present

## 2020-02-18 DIAGNOSIS — I499 Cardiac arrhythmia, unspecified: Secondary | ICD-10-CM | POA: Diagnosis not present

## 2020-02-18 DIAGNOSIS — E039 Hypothyroidism, unspecified: Secondary | ICD-10-CM | POA: Diagnosis not present

## 2020-02-18 DIAGNOSIS — I491 Atrial premature depolarization: Secondary | ICD-10-CM | POA: Diagnosis not present

## 2020-02-18 DIAGNOSIS — I2510 Atherosclerotic heart disease of native coronary artery without angina pectoris: Secondary | ICD-10-CM | POA: Diagnosis not present

## 2020-02-18 DIAGNOSIS — R072 Precordial pain: Secondary | ICD-10-CM | POA: Diagnosis not present

## 2020-02-18 DIAGNOSIS — K219 Gastro-esophageal reflux disease without esophagitis: Secondary | ICD-10-CM | POA: Diagnosis not present

## 2020-02-18 DIAGNOSIS — N1832 Chronic kidney disease, stage 3b: Secondary | ICD-10-CM | POA: Diagnosis not present

## 2020-02-18 DIAGNOSIS — E785 Hyperlipidemia, unspecified: Secondary | ICD-10-CM | POA: Diagnosis not present

## 2020-02-18 DIAGNOSIS — R42 Dizziness and giddiness: Secondary | ICD-10-CM | POA: Diagnosis not present

## 2020-02-18 DIAGNOSIS — R0789 Other chest pain: Secondary | ICD-10-CM | POA: Diagnosis not present

## 2020-02-18 DIAGNOSIS — Z7982 Long term (current) use of aspirin: Secondary | ICD-10-CM | POA: Diagnosis not present

## 2020-02-18 DIAGNOSIS — M1990 Unspecified osteoarthritis, unspecified site: Secondary | ICD-10-CM | POA: Diagnosis not present

## 2020-02-18 DIAGNOSIS — Z743 Need for continuous supervision: Secondary | ICD-10-CM | POA: Diagnosis not present

## 2020-02-19 ENCOUNTER — Telehealth: Payer: Self-pay | Admitting: Cardiology

## 2020-02-19 DIAGNOSIS — R079 Chest pain, unspecified: Secondary | ICD-10-CM | POA: Diagnosis not present

## 2020-02-19 DIAGNOSIS — N183 Chronic kidney disease, stage 3 unspecified: Secondary | ICD-10-CM | POA: Diagnosis not present

## 2020-02-19 DIAGNOSIS — I10 Essential (primary) hypertension: Secondary | ICD-10-CM | POA: Diagnosis not present

## 2020-02-19 DIAGNOSIS — R002 Palpitations: Secondary | ICD-10-CM | POA: Diagnosis not present

## 2020-02-19 DIAGNOSIS — I2510 Atherosclerotic heart disease of native coronary artery without angina pectoris: Secondary | ICD-10-CM

## 2020-02-19 NOTE — Telephone Encounter (Signed)
Ryan Mendoza came by requesting appt Ryan Mendoza states pt d/c from [facility] today & says pt needs pacemaker. Per provider informed Ryan Mendoza provider will review hospital record & call her to discuss & schedule.

## 2020-02-19 NOTE — Telephone Encounter (Signed)
Called patient's daughter informed her that per Dr. Agustin Cree we need to wait and see what monitor results show. They should be uploaded tomorrow. She is aware of this and aware if patient's heart rate drops in the mean time or has new or worsening symptoms to go to the emergency department. She verbally understood no further questions.

## 2020-02-19 NOTE — Telephone Encounter (Signed)
Patient's daughter, Loletha Carrow, states she would like to make Dr. Agustin Cree aware that the patient has been in [facility], where he is having a stress test. Vickie states the patient is hospitalized due to palpitations and she is requesting to speak with Dr. Wendy Poet nurse. Please return call to Vickie to discuss at [phone].

## 2020-02-20 NOTE — Telephone Encounter (Signed)
Monitor results are still not ready. Will consult with Dr. Agustin Cree.

## 2020-02-21 DIAGNOSIS — R001 Bradycardia, unspecified: Secondary | ICD-10-CM | POA: Diagnosis not present

## 2020-02-21 NOTE — Telephone Encounter (Signed)
Dr. Agustin Cree spoke with daughter. We are waiting for monitor results to come back.

## 2020-02-21 NOTE — Telephone Encounter (Signed)
Daughter following up

## 2020-02-21 NOTE — Telephone Encounter (Signed)
Called patient's daughter after speaking with zio rep. Informed her they have expedited this patient's monitor report and we should have results within 24 hours she verbally understood. No further questions.

## 2020-02-22 MED ORDER — ATORVASTATIN CALCIUM 40 MG PO TABS: 40.0000 mg | ORAL_TABLET | Freq: Every day | ORAL | 1 refills | Status: DC

## 2020-02-22 MED ORDER — AMIODARONE HCL 200 MG PO TABS: 200.0000 mg | ORAL_TABLET | Freq: Two times a day (BID) | ORAL | 1 refills | Status: DC

## 2020-02-22 NOTE — Telephone Encounter (Signed)
Dr. Agustin Cree reviewed the patient's monitor. He informed the patient's daughter that the patient needed to start amiodarone 200 mg twice daily. I also called the patient's daughter back to get the patient scheduled for a appointment to have a ekg next week per Dr. Agustin Cree.

## 2020-02-22 NOTE — Addendum Note (Signed)
Addended by: Linna Hoff R on: 02/22/2020 12:30 PM   Modules accepted: Orders

## 2020-02-23 ENCOUNTER — Telehealth: Payer: Self-pay | Admitting: Cardiology

## 2020-02-23 NOTE — Telephone Encounter (Signed)
Spoke with pt's daughter, Loletha Carrow and made her aware that Cardiology typically does not prescribe anti anxiety meds.  Advised daughter to reach out to PCP.  Daughter appreciative for call.

## 2020-02-23 NOTE — Telephone Encounter (Signed)
New Message  Patient's daughter is calling in to see if patient can be prescribed a medication for anxiety. States that patient is on a medication for his heart rate, but the family is noticing that patient is having panic attacks that also cause his heart rate to be increased. Patient can not take Xanax or Lexapro. Please give patient's daughter a call back to assist.

## 2020-02-27 DIAGNOSIS — N189 Chronic kidney disease, unspecified: Secondary | ICD-10-CM | POA: Diagnosis not present

## 2020-02-27 DIAGNOSIS — I259 Chronic ischemic heart disease, unspecified: Secondary | ICD-10-CM | POA: Diagnosis not present

## 2020-02-29 ENCOUNTER — Ambulatory Visit: Payer: Medicare Other | Admitting: Cardiology

## 2020-03-01 ENCOUNTER — Ambulatory Visit (INDEPENDENT_AMBULATORY_CARE_PROVIDER_SITE_OTHER): Payer: Medicare Other

## 2020-03-01 ENCOUNTER — Ambulatory Visit: Payer: Medicare Other | Admitting: Cardiology

## 2020-03-01 ENCOUNTER — Encounter: Payer: Self-pay | Admitting: Cardiology

## 2020-03-01 ENCOUNTER — Other Ambulatory Visit: Payer: Self-pay

## 2020-03-01 ENCOUNTER — Telehealth: Payer: Self-pay | Admitting: Cardiology

## 2020-03-01 DIAGNOSIS — R001 Bradycardia, unspecified: Secondary | ICD-10-CM | POA: Diagnosis not present

## 2020-03-01 DIAGNOSIS — I10 Essential (primary) hypertension: Secondary | ICD-10-CM | POA: Diagnosis not present

## 2020-03-01 DIAGNOSIS — I340 Nonrheumatic mitral (valve) insufficiency: Secondary | ICD-10-CM | POA: Diagnosis not present

## 2020-03-01 DIAGNOSIS — I25118 Atherosclerotic heart disease of native coronary artery with other forms of angina pectoris: Secondary | ICD-10-CM | POA: Diagnosis not present

## 2020-03-01 NOTE — Patient Instructions (Signed)
Medication Instructions:  Your physician recommends that you continue on your current medications as directed. Please refer to the Current Medication list given to you today.  *If you need a refill on your cardiac medications before your next appointment, please call your pharmacy*   Lab Work: NONE If you have labs (blood work) drawn today and your tests are completely normal, you will receive your results only by: Marland Kitchen MyChart Message (if you have MyChart) OR . A paper copy in the mail If you have any lab test that is abnormal or we need to change your treatment, we will call you to review the results.   Testing/Procedures: EKG done today   Follow-Up: At [facility], you and your health needs are our priority.  As part of our continuing mission to provide you with exceptional heart care, we have created designated Provider Care Teams.  These Care Teams include your primary Cardiologist (physician) and Advanced Practice Providers (APPs -  Physician Assistants and Nurse Practitioners) who all work together to provide you with the care you need, when you need it.  We recommend signing up for the patient portal called "MyChart".  Sign up information is provided on this After Visit Summary.  MyChart is used to connect with patients for Virtual Visits (Telemedicine).  Patients are able to view lab/test results, encounter notes, upcoming appointments, etc.  Non-urgent messages can be sent to your provider as well.   To learn more about what you can do with MyChart, go to [email or web address].    Your next appointment:   Follow up with Dr. Agustin Cree The format for your next appointment:  Appt with Dr. Agustin Cree on April 1st at 1:20  Provider:     Other Instructions

## 2020-03-01 NOTE — Progress Notes (Signed)
Cardiology Office Note:    Date:  03/01/2020   ID:  Ryan Mendoza, DOB 07-Jun-1934, MRN [id number]  PCP:  Angelina Sheriff, MD  Cardiologist:  No primary care provider on file.  Electrophysiologist:  None   Referring MD: Angelina Sheriff, MD   Chief Complaint  Patient presents with  . Medication Management    amiodarone   History of Present Illness:    Ryan Mendoza is a 86 y.o. male with moderate mitral regurgitation, also some extrasystole, essential hypertension, hypothyroidism, remote coronary artery disease - he was recently started on Amiodarone. I  He also was did wear zio monitor for 1 week - he send this out we are waiting.   He offers no complaints at this time.    Past Medical History:  Diagnosis Date  . Anxiety   . Aortic valve regurgitation   . Arthritis    hands and knees  . BPH (benign prostatic hyperplasia)   . Bradycardia 11/22/2019  . Cataract, left eye   . Claustrophobia   . Coronary artery disease   . Depression    no current meds for  . Dyspnea    DOE  . Dyspnea on exertion 12/08/2018  . Elevated cholesterol   . Essential hypertension 06/19/2019  . Gallstones   . GERD (gastroesophageal reflux disease)   . History of kidney stones   . Hyperparathyroidism, primary ([place]) 09/27/2017  . Hypertension   . Hypothyroidism   . Mitral regurgitation 12/08/2018   Mild to moderate by echo from August 2019  . Myocardial infarction ([place])    SILENT  . Obstructive sleep apnea 12/08/2018  . Primary hyperparathyroidism ([place])   . 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 Tinnitus    both ears all the time    Past Surgical History:  Procedure Laterality Date  . CATARACT EXTRACTION W/PHACO Left 04/12/2018   Procedure: CATARACT EXTRACTION PHACO AND INTRAOCULAR LENS PLACEMENT (IOC);  Surgeon: Birder Robson, MD;  Location: [facility] ORS;  Service: Ophthalmology;  Laterality: Left;  [place] 00:39.5 AP% 16.7 CDE  6.62 Fluid Pack Lot # [id number]  . CORONARY ANGIOPLASTY     2017  . EYE SURGERY Right    ioc for cataract  . PARATHYROIDECTOMY Right 09/28/2017   Procedure: RIGHT INFERIOR PARATHYROIDECTOMY;  Surgeon: Armandina Gemma, MD;  Location: [facility];  Service: General;  Laterality: Right;  . right eye  plug for tear duct surgery    . stents to heart x 2  06/2016    Current Medications: Current Meds  Medication Sig  . ALPRAZolam (XANAX) 0.25 MG tablet Take 1 tablet by mouth at bedtime.  Marland Kitchen amiodarone (PACERONE) 200 MG tablet Take 1 tablet (200 mg total) by mouth 2 (two) times daily.  Marland Kitchen amLODipine (NORVASC) 5 MG tablet TAKE 1 TABLET BY MOUTH EVERYDAY AT BEDTIME  . aspirin EC 81 MG tablet Take 81 mg by mouth at bedtime.   Marland Kitchen atorvastatin (LIPITOR) 40 MG tablet Take 1 tablet (40 mg total) by mouth at bedtime.  . busPIRone (BUSPAR) 5 MG tablet Take 5 mg by mouth 2 (two) times daily.  . cloNIDine (CATAPRES) 0.1 MG tablet Take 1 tablet (0.1 mg total) by mouth 2 (two) times daily with a meal.  . escitalopram (LEXAPRO) 10 MG tablet Take 10 mg by mouth daily.  . furosemide (LASIX) 20 MG tablet TAKE 1 TABLET BY MOUTH EVERY DAY  . levothyroxine (SYNTHROID)  75 MCG tablet TAKE 1 TABLET (75 MCG TOTAL) BY MOUTH AT BEDTIME.  . nitroGLYCERIN (NITROSTAT) 0.4 MG SL tablet Place 1 tablet (0.4 mg total) under the tongue every 5 (five) minutes as needed for chest pain.  . pantoprazole (PROTONIX) 40 MG tablet Take 40 mg by mouth 2 (two) times daily.     Allergies:   Patient has no known allergies.   Social History   Socioeconomic History  . Marital status: Married    Spouse name: Not on file  . Number of children: Not on file  . Years of education: Not on file  . Highest education level: Not on file  Occupational History  . Not on file  Tobacco Use  . Smoking status: Former Smoker    Packs/day: 1.00    Years: 10.00    Pack years: 10.00    Types: Cigarettes  . Smokeless tobacco: Never Used  . Tobacco comment:  quit 1974  Substance and Sexual Activity  . Alcohol use: No  . Drug use: No  . Sexual activity: Not on file  Other Topics Concern  . Not on file  Social History Narrative  . Not on file   Social Determinants of Health   Financial Resource Strain:   . Difficulty of Paying Living Expenses:   Food Insecurity:   . Worried About Charity fundraiser in the Last Year:   . Arboriculturist in the Last Year:   Transportation Needs:   . Film/video editor (Medical):   Marland Kitchen Lack of Transportation (Non-Medical):   Physical Activity:   . Days of Exercise per Week:   . Minutes of Exercise per Session:   Stress:   . Feeling of Stress :   Social Connections:   . Frequency of Communication with Friends and Family:   . Frequency of Social Gatherings with Friends and Family:   . Attends Religious Services:   . Active Member of Clubs or Organizations:   . Attends Archivist Meetings:   Marland Kitchen Marital Status:      Family History: The patient's family history includes Heart attack in his father and mother; Heart disease in his father and mother.  ROS:   Review of Systems  Constitution: Negative for decreased appetite, fever and weight gain.  HENT: Negative for congestion, ear discharge, hoarse voice and sore throat.   Eyes: Negative for discharge, redness, vision loss in right eye and visual halos.  Cardiovascular: Negative for chest pain, dyspnea on exertion, leg swelling, orthopnea and palpitations.  Respiratory: Negative for cough, hemoptysis, shortness of breath and snoring.   Endocrine: Negative for heat intolerance and polyphagia.  Hematologic/Lymphatic: Negative for bleeding problem. Does not bruise/bleed easily.  Skin: Negative for flushing, nail changes, rash and suspicious lesions.  Musculoskeletal: Negative for arthritis, joint pain, muscle cramps, myalgias, neck pain and stiffness.  Gastrointestinal: Negative for abdominal pain, bowel incontinence, diarrhea and excessive  appetite.  Genitourinary: Negative for decreased libido, genital sores and incomplete emptying.  Neurological: Negative for brief paralysis, focal weakness, headaches and loss of balance.  Psychiatric/Behavioral: Negative for altered mental status, depression and suicidal ideas.  Allergic/Immunologic: Negative for HIV exposure and persistent infections.    EKGs/Labs/Other Studies Reviewed:    The following studies were reviewed today:   EKG:  The ekg ordered today demonstrates sinus bradycardia, heart rate 51 bpm, LVH by Cornell criteria.  Compared to EKG done on 02/08/2020 patient now is bradycardic. Recent Labs: 11/08/2019: BUN 23; Creatinine, Ser 1.64;  Hemoglobin 15.2; NT-Pro BNP 534; Platelets 183; Potassium 4.9; Sodium 141; TSH 5.520  Recent Lipid Panel No results found for: CHOL, TRIG, HDL, CHOLHDL, VLDL, LDLCALC, LDLDIRECT  Physical Exam:    VS:  BP 140/60 (BP Location: Right Arm, Patient Position: Sitting, Cuff Size: Normal)   Pulse (!) 50   Ht 5\' 9"  (1.753 m)   Wt 186 lb (84.4 kg)   SpO2 98%   BMI 27.47 kg/m     Wt Readings from Last 3 Encounters:  03/01/20 186 lb (84.4 kg)  02/08/20 181 lb 1.9 oz (82.2 kg)  01/15/20 185 lb (83.9 kg)     GEN: Well nourished, well developed in no acute distress HEENT: Normal NECK: No JVD; No carotid bruits LYMPHATICS: No lymphadenopathy CARDIAC: S1S2 noted,RRR, no murmurs, rubs, gallops RESPIRATORY:  Clear to auscultation without rales, wheezing or rhonchi  ABDOMEN: Soft, non-tender, non-distended, +bowel sounds, no guarding. EXTREMITIES: No edema, No cyanosis, no clubbing MUSCULOSKELETAL:  No deformity  SKIN: Warm and dry NEUROLOGIC:  Alert and oriented x 3, non-focal PSYCHIATRIC:  Normal affect, good insight  ASSESSMENT:    1. Bradycardia    PLAN:     1.  He denies any symptoms today.  His EKG with sinus bradycardia to sinus rhythm 02/08/2020 with no other significant changes.  Continue his current medication and  follow-up with Dr. Agustin Cree as scheduled  The patient is in agreement with the above plan. The patient left the office in stable condition.  The patient will follow up with Dr. Agustin Cree as schedule.   Medication Adjustments/Labs and Tests Ordered: Current medicines are reviewed at length with the patient today.  Concerns regarding medicines are outlined above.  No orders of the defined types were placed in this encounter.  No orders of the defined types were placed in this encounter.   Patient Instructions  Medication Instructions:  Your physician recommends that you continue on your current medications as directed. Please refer to the Current Medication list given to you today.  *If you need a refill on your cardiac medications before your next appointment, please call your pharmacy*   Lab Work: NONE If you have labs (blood work) drawn today and your tests are completely normal, you will receive your results only by: Marland Kitchen MyChart Message (if you have MyChart) OR . A paper copy in the mail If you have any lab test that is abnormal or we need to change your treatment, we will call you to review the results.   Testing/Procedures: EKG done today   Follow-Up: At [facility], you and your health needs are our priority.  As part of our continuing mission to provide you with exceptional heart care, we have created designated Provider Care Teams.  These Care Teams include your primary Cardiologist (physician) and Advanced Practice Providers (APPs -  Physician Assistants and Nurse Practitioners) who all work together to provide you with the care you need, when you need it.  We recommend signing up for the patient portal called "MyChart".  Sign up information is provided on this After Visit Summary.  MyChart is used to connect with patients for Virtual Visits (Telemedicine).  Patients are able to view lab/test results, encounter notes, upcoming appointments, etc.  Non-urgent messages can be  sent to your provider as well.   To learn more about what you can do with MyChart, go to [email or web address].    Your next appointment:   Follow up with Dr. Agustin Cree The format for your next appointment:  Appt  with Dr. Agustin Cree on April 1st at 1:20  Provider:     Other Instructions     Adopting a Healthy Lifestyle.  Know what a healthy weight is for you (roughly BMI <25) and aim to maintain this   Aim for 7+ servings of fruits and vegetables daily   65-80+ fluid ounces of water or unsweet tea for healthy kidneys   Limit to max 1 drink of alcohol per day; avoid smoking/tobacco   Limit animal fats in diet for cholesterol and heart health - choose grass fed whenever available   Avoid highly processed foods, and foods high in saturated/trans fats   Aim for low stress - take time to unwind and care for your mental health   Aim for 150 min of moderate intensity exercise weekly for heart health, and weights twice weekly for bone health   Aim for 7-9 hours of sleep daily   When it comes to diets, agreement about the perfect plan isnt easy to find, even among the experts. Experts at the [employer] developed an idea known as the Healthy Eating Plate. Just imagine a plate divided into logical, healthy portions.   The emphasis is on diet quality:   Load up on vegetables and fruits - one-half of your plate: Aim for color and variety, and remember that potatoes dont count.   Go for whole grains - one-quarter of your plate: Whole wheat, barley, wheat berries, quinoa, oats, brown rice, and foods made with them. If you want pasta, go with whole wheat pasta.   Protein power - one-quarter of your plate: Fish, chicken, beans, and nuts are all healthy, versatile protein sources. Limit red meat.   The diet, however, does go beyond the plate, offering a few other suggestions.   Use healthy plant oils, such as olive, canola, soy, corn, sunflower and peanut. Check  the labels, and avoid partially hydrogenated oil, which have unhealthy trans fats.   If youre thirsty, drink water. Coffee and tea are good in moderation, but skip sugary drinks and limit milk and dairy products to one or two daily servings.   The type of carbohydrate in the diet is more important than the amount. Some sources of carbohydrates, such as vegetables, fruits, whole grains, and beans-are healthier than others.   Finally, stay active  Signed, Berniece Salines, DO  03/01/2020 3:11 PM    [facility]

## 2020-03-01 NOTE — Progress Notes (Signed)
Complete echocardiogram has been performed.  Jimmy Wing Gfeller RDCs, RVT

## 2020-03-01 NOTE — Telephone Encounter (Signed)
New Message  Patient's daughter is calling in to speak with Dr. Harriet Masson. States that she was supposed to get a call back from Dr. Harriet Masson after the appointment with the patient. Please give patient's daughter a call.

## 2020-03-04 NOTE — Telephone Encounter (Signed)
I spoke with patient daughter Friday after clinic and updated her.  Is this a new call from today?

## 2020-03-13 DIAGNOSIS — S61402A Unspecified open wound of left hand, initial encounter: Secondary | ICD-10-CM | POA: Diagnosis not present

## 2020-03-14 ENCOUNTER — Ambulatory Visit: Payer: Medicare Other | Admitting: Cardiology

## 2020-03-15 ENCOUNTER — Other Ambulatory Visit: Payer: Self-pay | Admitting: Cardiology

## 2020-03-15 NOTE — Telephone Encounter (Signed)
Rx refill sent to pharmacy. 

## 2020-03-19 ENCOUNTER — Ambulatory Visit: Payer: Medicare Other | Admitting: Cardiology

## 2020-03-28 DIAGNOSIS — S0121XA Laceration without foreign body of nose, initial encounter: Secondary | ICD-10-CM | POA: Diagnosis not present

## 2020-04-03 ENCOUNTER — Other Ambulatory Visit: Payer: Self-pay

## 2020-04-04 ENCOUNTER — Encounter: Payer: Self-pay | Admitting: Cardiology

## 2020-04-04 ENCOUNTER — Other Ambulatory Visit: Payer: Self-pay

## 2020-04-04 ENCOUNTER — Ambulatory Visit (INDEPENDENT_AMBULATORY_CARE_PROVIDER_SITE_OTHER): Payer: Medicare Other | Admitting: Cardiology

## 2020-04-04 VITALS — BP 158/68 | HR 47 | Temp 97.4°F | Ht 68.0 in | Wt 189.2 lb

## 2020-04-04 DIAGNOSIS — Z955 Presence of coronary angioplasty implant and graft: Secondary | ICD-10-CM

## 2020-04-04 DIAGNOSIS — I25119 Atherosclerotic heart disease of native coronary artery with unspecified angina pectoris: Secondary | ICD-10-CM | POA: Diagnosis not present

## 2020-04-04 DIAGNOSIS — R0609 Other forms of dyspnea: Secondary | ICD-10-CM

## 2020-04-04 DIAGNOSIS — I10 Essential (primary) hypertension: Secondary | ICD-10-CM

## 2020-04-04 DIAGNOSIS — R0600 Dyspnea, unspecified: Secondary | ICD-10-CM

## 2020-04-04 NOTE — Progress Notes (Signed)
Cardiology Office Note:    Date:  04/04/2020   ID:  Ryan Mendoza, DOB 09/10/1934, MRN [id number]  PCP:  Angelina Sheriff, MD  Cardiologist:  Jenne Campus, MD    Referring MD: Angelina Sheriff, MD   No chief complaint on file. Doing well  History of Present Illness:    Ryan Mendoza is a 86 y.o. male with past medical history significant for remote coronary artery disease, essential hypertension, mitral regurgitation, ventricular ectopy with short run of nonsustained ventricular tachycardia.  Also he gets significant bradycardia with for stress reduction of multiple medications.  He comes today 2 months of follow-up overall doing very well.  Denies have any chest pain tightness squeezing pressure burning chest.  I initiated with a very small dose of amiodarone which seems to be helping.  Past Medical History:  Diagnosis Date  . Anxiety   . Aortic valve regurgitation   . Arthritis    hands and knees  . BPH (benign prostatic hyperplasia)   . Bradycardia 11/22/2019  . Cataract, left eye   . Claustrophobia   . Coronary artery disease   . Coronary artery disease involving native coronary artery of native heart with angina pectoris ([place]) 12/08/2018   PTCA and stenting of LAD and circumflex in 2017  Formatting of this note might be different from the original. PTCA and drug-eluting stent to LAD as well as the circumflex in June 2017  . Depression    no current meds for  . Dyspnea    DOE  . Dyspnea on exertion 12/08/2018  . Elevated cholesterol   . Essential hypertension 06/19/2019  . Gallstones   . GERD (gastroesophageal reflux disease)   . History of kidney stones   . Hyperparathyroidism, primary ([place]) 09/27/2017  . Hypertension   . Hypothyroidism   . Mitral regurgitation 12/08/2018   Mild to moderate by echo from August 2019  . Myocardial infarction ([place])    SILENT  . Obstructive sleep apnea 12/08/2018  . Primary hyperparathyroidism ([place])   . 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 Status post coronary artery stent placement 12/21/2017   Formatting of this note might be different from the original. LAD and Cx 2017  . Tinnitus    both ears all the time    Past Surgical History:  Procedure Laterality Date  . CATARACT EXTRACTION W/PHACO Left 04/12/2018   Procedure: CATARACT EXTRACTION PHACO AND INTRAOCULAR LENS PLACEMENT (IOC);  Surgeon: Birder Robson, MD;  Location: [facility] ORS;  Service: Ophthalmology;  Laterality: Left;  [place] 00:39.5 AP% 16.7 CDE 6.62 Fluid Pack Lot # [id number]  . CORONARY ANGIOPLASTY     2017  . EYE SURGERY Right    ioc for cataract  . PARATHYROIDECTOMY Right 09/28/2017   Procedure: RIGHT INFERIOR PARATHYROIDECTOMY;  Surgeon: Armandina Gemma, MD;  Location: [facility]S;  Service: General;  Laterality: Right;  . right eye  plug for tear duct surgery    . stents to heart x 2  06/2016    Current Medications: Current Meds  Medication Sig  . albuterol (VENTOLIN HFA) 108 (90 Base) MCG/ACT inhaler Inhale 1-2 puffs into the lungs every 4 (four) hours as needed.  . ALPRAZolam (XANAX) 0.25 MG tablet Take 1 tablet by mouth at bedtime.  Marland Kitchen amiodarone (PACERONE) 200 MG tablet TAKE 1 TABLET BY MOUTH TWICE A DAY  . amLODipine (NORVASC) 5 MG tablet TAKE 1 TABLET BY MOUTH  EVERYDAY AT BEDTIME  . aspirin EC 81 MG tablet Take 81 mg by mouth at bedtime.   Marland Kitchen atorvastatin (LIPITOR) 40 MG tablet Take 1 tablet (40 mg total) by mouth at bedtime.  . cloNIDine (CATAPRES) 0.1 MG tablet Take 1 tablet (0.1 mg total) by mouth 2 (two) times daily with a meal.  . escitalopram (LEXAPRO) 10 MG tablet Take 10 mg by mouth daily.  . furosemide (LASIX) 20 MG tablet TAKE 1 TABLET BY MOUTH EVERY DAY  . levothyroxine (SYNTHROID) 75 MCG tablet TAKE 1 TABLET (75 MCG TOTAL) BY MOUTH AT BEDTIME.  . nitroGLYCERIN (NITROSTAT) 0.4 MG SL tablet Place 1 tablet (0.4 mg total) under the tongue every 5 (five) minutes as needed for chest pain.    Marland Kitchen ondansetron (ZOFRAN-ODT) 4 MG disintegrating tablet Take 4 mg by mouth every 8 (eight) hours as needed.  . pantoprazole (PROTONIX) 40 MG tablet Take 40 mg by mouth 2 (two) times daily.     Allergies:   Patient has no known allergies.   Social History   Socioeconomic History  . Marital status: Married    Spouse name: Not on file  . Number of children: Not on file  . Years of education: Not on file  . Highest education level: Not on file  Occupational History  . Not on file  Tobacco Use  . Smoking status: Former Smoker    Packs/day: 1.00    Years: 10.00    Pack years: 10.00    Types: Cigarettes  . Smokeless tobacco: Never Used  . Tobacco comment: quit 1974  Substance and Sexual Activity  . Alcohol use: No  . Drug use: No  . Sexual activity: Not on file  Other Topics Concern  . Not on file  Social History Narrative  . Not on file   Social Determinants of Health   Financial Resource Strain:   . Difficulty of Paying Living Expenses:   Food Insecurity:   . Worried About Charity fundraiser in the Last Year:   . Arboriculturist in the Last Year:   Transportation Needs:   . Film/video editor (Medical):   Marland Kitchen Lack of Transportation (Non-Medical):   Physical Activity:   . Days of Exercise per Week:   . Minutes of Exercise per Session:   Stress:   . Feeling of Stress :   Social Connections:   . Frequency of Communication with Friends and Family:   . Frequency of Social Gatherings with Friends and Family:   . Attends Religious Services:   . Active Member of Clubs or Organizations:   . Attends Archivist Meetings:   Marland Kitchen Marital Status:      Family History: The patient's family history includes Heart attack in his father and mother; Heart disease in his father and mother. ROS:   Please see the history of present illness.    All 14 point review of systems negative except as described per history of present illness  EKGs/Labs/Other Studies Reviewed:       Recent Labs: 11/08/2019: BUN 23; Creatinine, Ser 1.64; Hemoglobin 15.2; NT-Pro BNP 534; Platelets 183; Potassium 4.9; Sodium 141; TSH 5.520  Recent Lipid Panel No results found for: CHOL, TRIG, HDL, CHOLHDL, VLDL, LDLCALC, LDLDIRECT  Physical Exam:    VS:  BP (!) 158/68   Pulse (!) 47   Temp (!) 97.4 F (36.3 C)   Ht 5\' 8"  (1.727 m)   Wt 189 lb 3.2 oz (85.8 kg)  SpO2 97%   BMI 28.77 kg/m     Wt Readings from Last 3 Encounters:  04/04/20 189 lb 3.2 oz (85.8 kg)  03/01/20 186 lb (84.4 kg)  02/08/20 181 lb 1.9 oz (82.2 kg)     GEN:  Well nourished, well developed in no acute distress HEENT: Normal NECK: No JVD; No carotid bruits LYMPHATICS: No lymphadenopathy CARDIAC: RRR, no murmurs, no rubs, no gallops RESPIRATORY:  Clear to auscultation without rales, wheezing or rhonchi  ABDOMEN: Soft, non-tender, non-distended MUSCULOSKELETAL:  No edema; No deformity  SKIN: Warm and dry LOWER EXTREMITIES: no swelling NEUROLOGIC:  Alert and oriented x 3 PSYCHIATRIC:  Normal affect   ASSESSMENT:    1. Coronary artery disease involving native coronary artery of native heart with angina pectoris ([place])   2. Essential hypertension   3. Status post coronary artery stent placement   4. Dyspnea on exertion    PLAN:    In order of problems listed above:  1. Coronary disease doing well from that point review we will continue present management. 2. Essential hypertension: Blood pressure well controlled. 3. Dyspnea on exertion denies having any 4. Sinus bradycardia.  Holter monitor reviewed which showed acceptable average heart rate.  We will continue present medications, I Asked Him to Reduce Amiodarone to Only 1 Tablet a Day   Medication Adjustments/Labs and Tests Ordered: Current medicines are reviewed at length with the patient today.  Concerns regarding medicines are outlined above.  No orders of the defined types were placed in this encounter.  Medication changes: No  orders of the defined types were placed in this encounter.   Signed, Park Liter, MD, [facility] 04/04/2020 11:56 AM    [place]

## 2020-04-04 NOTE — Patient Instructions (Addendum)
Medication Instructions:  Your physician has recommended you make the following change in your medication:  1. Decrease Amiodarone to one tablet daily.    Labwork: -None  Testing/Procedures: -None  Follow-Up: Your physician recommends that you keep your scheduled follow-up appointment in June with Dr. Fraser Din.   Any Other Special Instructions Will Be Listed Below (If Applicable).     If you need a refill on your cardiac medications before your next appointment, please call your pharmacy.

## 2020-04-16 DIAGNOSIS — N451 Epididymitis: Secondary | ICD-10-CM | POA: Diagnosis not present

## 2020-04-23 ENCOUNTER — Telehealth: Payer: Self-pay | Admitting: Cardiology

## 2020-04-23 NOTE — Telephone Encounter (Signed)
Pt c/o medication issue:  1. Name of Medication: amiodarone (PACERONE) 200 MG tablet  2. How are you currently taking this medication (dosage and times per day)? As directed  3. Are you having a reaction (difficulty breathing--STAT)? yes  4. What is your medication issue? HR 46, states he feels weak. He cant walk around due to just feeling tired. States his BP is 148/66. Denies any other symptoms. States Dr. Agustin Cree cut this medication in half and it was doing good for a while but he is concerned with the low HR.

## 2020-04-23 NOTE — Telephone Encounter (Signed)
Called patient he reports the his heart rate has been in the 40's for the past week and he has felt weak, dizzy and short of breath at times. No other symptoms. On the phone his heart rate was 57. Dr. Agustin Cree advised we decrease amiodarone to 100 mg daily and see if that helps will call to check on patient in 2 days, advised him to call us if things change or go to the emergency department if things got worse. No further questions he verbally understood.

## 2020-04-24 ENCOUNTER — Other Ambulatory Visit: Payer: Self-pay | Admitting: Cardiology

## 2020-05-16 ENCOUNTER — Telehealth: Payer: Self-pay | Admitting: Cardiology

## 2020-05-16 NOTE — Telephone Encounter (Signed)
Called patient will consult with Dr. Agustin Cree and document further.

## 2020-05-16 NOTE — Telephone Encounter (Signed)
Spoke with patient at 4:05 pm today. He reported that his heart rate is currently 81 and has been running as low as 45 for about a week. He is still taking amiodarone 100 mg daily right now. He wanted to consult with Dr. Agustin Cree about this and see if he needs to change medication. Dr. Agustin Cree advised he stop amiodarone patient informed and advised to call us if stopping the medication doesn't help him. No further questions.

## 2020-05-16 NOTE — Telephone Encounter (Signed)
STAT if HR is under 50 or over 120 (normal HR is 60-100 beats per minute)  1) What is your heart rate? 48  2) Do you have a log of your heart rate readings (document readings)? Stays between 45-48  3) Do you have any other symptoms? Patient states that Dr. Agustin Cree recently changed his amiodarone (PACERONE) 200 MG tablet. He denies any symptoms right now. Says he had chest pain a couple of days ago but he had covid back in December so is unsure if there is a correlation there.      ?

## 2020-05-21 DIAGNOSIS — Z23 Encounter for immunization: Secondary | ICD-10-CM | POA: Diagnosis not present

## 2020-05-21 DIAGNOSIS — E7800 Pure hypercholesterolemia, unspecified: Secondary | ICD-10-CM | POA: Diagnosis not present

## 2020-05-21 DIAGNOSIS — I259 Chronic ischemic heart disease, unspecified: Secondary | ICD-10-CM | POA: Diagnosis not present

## 2020-05-21 DIAGNOSIS — Z0000 Encounter for general adult medical examination without abnormal findings: Secondary | ICD-10-CM | POA: Diagnosis not present

## 2020-05-21 DIAGNOSIS — Z79899 Other long term (current) drug therapy: Secondary | ICD-10-CM | POA: Diagnosis not present

## 2020-05-21 DIAGNOSIS — T148XXA Other injury of unspecified body region, initial encounter: Secondary | ICD-10-CM | POA: Diagnosis not present

## 2020-05-27 ENCOUNTER — Other Ambulatory Visit: Payer: Self-pay

## 2020-06-03 ENCOUNTER — Other Ambulatory Visit: Payer: Self-pay

## 2020-06-03 ENCOUNTER — Encounter: Payer: Self-pay | Admitting: Cardiology

## 2020-06-03 ENCOUNTER — Ambulatory Visit: Payer: Medicare Other | Admitting: Cardiology

## 2020-06-03 VITALS — BP 145/69 | HR 58 | Ht 68.0 in | Wt 189.0 lb

## 2020-06-03 DIAGNOSIS — I25119 Atherosclerotic heart disease of native coronary artery with unspecified angina pectoris: Secondary | ICD-10-CM | POA: Diagnosis not present

## 2020-06-03 DIAGNOSIS — I340 Nonrheumatic mitral (valve) insufficiency: Secondary | ICD-10-CM | POA: Diagnosis not present

## 2020-06-03 DIAGNOSIS — R001 Bradycardia, unspecified: Secondary | ICD-10-CM | POA: Diagnosis not present

## 2020-06-03 DIAGNOSIS — I10 Essential (primary) hypertension: Secondary | ICD-10-CM

## 2020-06-03 NOTE — Progress Notes (Signed)
Cardiology Office Note:    Date:  06/03/2020   ID:  Ryan Mendoza, DOB [birth year], MRN [id number]  PCP:  Angelina Sheriff, MD  Cardiologist:  Jenne Campus, MD    Referring MD: Angelina Sheriff, MD   Chief Complaint  Patient presents with  . Follow-up    2 -3 MO FU   Doing well  History of Present Illness:    Ryan Mendoza is a 86 y.o. male past medical history significant for coronary artery disease, status post PTCA and stenting done to LAD and circumflex artery in 2017, essential hypertension, mitral regurgitation, obstructive sleep apnea, dyslipidemia.  Comes today to my office for follow-up last time will be dealing with bradycardia.  I will completely discontinue his amiodarone and now he is feeling dramatically better he checked his heart rate usually about 50 and above he is feeling good he is working still and the shop and he is trying to be busy stay away from hot weather.  Past Medical History:  Diagnosis Date  . Anxiety   . Aortic valve regurgitation   . Arthritis    hands and knees  . BPH (benign prostatic hyperplasia)   . Bradycardia 11/22/2019  . Cataract, left eye   . Claustrophobia   . Coronary artery disease   . Coronary artery disease involving native coronary artery of native heart with angina pectoris ([place]) 12/08/2018   PTCA and stenting of LAD and circumflex in 2017  Formatting of this note might be different from the original. PTCA and drug-eluting stent to LAD as well as the circumflex in June 2017  . Depression    no current meds for  . Dyspnea    DOE  . Dyspnea on exertion 12/08/2018  . Elevated cholesterol   . Essential hypertension 06/19/2019  . Gallstones   . GERD (gastroesophageal reflux disease)   . History of kidney stones   . Hyperparathyroidism, primary ([place]) 09/27/2017  . Hypertension   . Hypothyroidism   . Mitral regurgitation 12/08/2018   Mild to moderate by echo from August 2019  . Myocardial infarction ([place])    SILENT  .  Obstructive sleep apnea 12/08/2018  . Primary hyperparathyroidism ([place])   . 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 Status post coronary artery stent placement 12/21/2017   Formatting of this note might be different from the original. LAD and Cx 2017  . Tinnitus    both ears all the time    Past Surgical History:  Procedure Laterality Date  . CATARACT EXTRACTION W/PHACO Left 04/12/2018   Procedure: CATARACT EXTRACTION PHACO AND INTRAOCULAR LENS PLACEMENT (IOC);  Surgeon: Birder Robson, MD;  Location: [facility] ORS;  Service: Ophthalmology;  Laterality: Left;  [place] 00:39.5 AP% 16.7 CDE 6.62 Fluid Pack Lot # [id number]  . CORONARY ANGIOPLASTY     2017  . EYE SURGERY Right    ioc for cataract  . PARATHYROIDECTOMY Right 09/28/2017   Procedure: RIGHT INFERIOR PARATHYROIDECTOMY;  Surgeon: Armandina Gemma, MD;  Location: [facility];  Service: General;  Laterality: Right;  . right eye  plug for tear duct surgery    . stents to heart x 2  06/2016    Current Medications: Current Meds  Medication Sig  . albuterol (VENTOLIN HFA) 108 (90 Base) MCG/ACT inhaler Inhale 1-2 puffs into the lungs every 4 (four) hours as needed.  Marland Kitchen amiodarone (PACERONE) 200 MG tablet Take 200 mg by  mouth daily.  Marland Kitchen amLODipine (NORVASC) 5 MG tablet TAKE 1 TABLET BY MOUTH EVERYDAY AT BEDTIME  . aspirin EC 81 MG tablet Take 81 mg by mouth at bedtime.   Marland Kitchen atorvastatin (LIPITOR) 40 MG tablet Take 1 tablet (40 mg total) by mouth at bedtime.  . busPIRone (BUSPAR) 5 MG tablet Take 5 mg by mouth 3 (three) times daily.  . cloNIDine (CATAPRES) 0.1 MG tablet Take 1 tablet (0.1 mg total) by mouth 2 (two) times daily with a meal.  . furosemide (LASIX) 20 MG tablet TAKE 1 TABLET BY MOUTH EVERY DAY  . levothyroxine (SYNTHROID) 75 MCG tablet TAKE 1 TABLET (75 MCG TOTAL) BY MOUTH AT BEDTIME.  . nitroGLYCERIN (NITROSTAT) 0.4 MG SL tablet Place 1 tablet (0.4 mg total) under the tongue every 5 (five)  minutes as needed for chest pain.  Marland Kitchen ondansetron (ZOFRAN-ODT) 4 MG disintegrating tablet Take 4 mg by mouth every 8 (eight) hours as needed.  . pantoprazole (PROTONIX) 40 MG tablet Take 40 mg by mouth 2 (two) times daily.  . ramelteon (ROZEREM) 8 MG tablet Take 8 mg by mouth at bedtime.     Allergies:   Patient has no known allergies.   Social History   Socioeconomic History  . Marital status: Married    Spouse name: Not on file  . Number of children: Not on file  . Years of education: Not on file  . Highest education level: Not on file  Occupational History  . Not on file  Tobacco Use  . Smoking status: Former Smoker    Packs/day: 1.00    Years: 10.00    Pack years: 10.00    Types: Cigarettes  . Smokeless tobacco: Never Used  . Tobacco comment: quit 1974  Vaping Use  . Vaping Use: Never used  Substance and Sexual Activity  . Alcohol use: No  . Drug use: No  . Sexual activity: Not on file  Other Topics Concern  . Not on file  Social History Narrative  . Not on file   Social Determinants of Health   Financial Resource Strain:   . Difficulty of Paying Living Expenses:   Food Insecurity:   . Worried About Charity fundraiser in the Last Year:   . Arboriculturist in the Last Year:   Transportation Needs:   . Film/video editor (Medical):   Marland Kitchen Lack of Transportation (Non-Medical):   Physical Activity:   . Days of Exercise per Week:   . Minutes of Exercise per Session:   Stress:   . Feeling of Stress :   Social Connections:   . Frequency of Communication with Friends and Family:   . Frequency of Social Gatherings with Friends and Family:   . Attends Religious Services:   . Active Member of Clubs or Organizations:   . Attends Archivist Meetings:   Marland Kitchen Marital Status:      Family History: The patient's family history includes Heart attack in his father and mother; Heart disease in his father and mother. ROS:   Please see the history of present  illness.    All 14 point review of systems negative except as described per history of present illness  EKGs/Labs/Other Studies Reviewed:      Recent Labs: 11/08/2019: BUN 23; Creatinine, Ser 1.64; Hemoglobin 15.2; NT-Pro BNP 534; Platelets 183; Potassium 4.9; Sodium 141; TSH 5.520  Recent Lipid Panel No results found for: CHOL, TRIG, HDL, CHOLHDL, VLDL, LDLCALC, LDLDIRECT  Physical  Exam:    VS:  BP (!) 145/69 (BP Location: Right Arm, Patient Position: Sitting, Cuff Size: Normal)   Pulse (!) 58   Ht 5\' 8"  (1.727 m)   Wt 189 lb (85.7 kg)   SpO2 94%   BMI 28.74 kg/m     Wt Readings from Last 3 Encounters:  06/03/20 189 lb (85.7 kg)  04/04/20 189 lb 3.2 oz (85.8 kg)  03/01/20 186 lb (84.4 kg)     GEN:  Well nourished, well developed in no acute distress HEENT: Normal NECK: No JVD; No carotid bruits LYMPHATICS: No lymphadenopathy CARDIAC: RRR, no murmurs, no rubs, no gallops RESPIRATORY:  Clear to auscultation without rales, wheezing or rhonchi  ABDOMEN: Soft, non-tender, non-distended MUSCULOSKELETAL:  No edema; No deformity  SKIN: Warm and dry LOWER EXTREMITIES: no swelling NEUROLOGIC:  Alert and oriented x 3 PSYCHIATRIC:  Normal affect   ASSESSMENT:    1. Coronary artery disease involving native coronary artery of native heart with angina pectoris ([place])   2. Essential hypertension   3. Nonrheumatic mitral valve regurgitation   4. Bradycardia    PLAN:    In order of problems listed above:  1. Coronary disease stable without any issues to be going on right now.  We will continue present medications. 2. Essential hypertension his blood pressure is controlled/elevated today but at home always good. 3. Dyslipidemia he is taking Lipitor 40.  I did review his K PN which showed me his LDL of 69 and HDL 39 this is from 05/21/2020. 4. Bradycardia: That being addressed with discontinuation of amiodarone.  He is doing well from that point review.  I see him back in my office  in about 6 months or sooner if he got a problem   Medication Adjustments/Labs and Tests Ordered: Current medicines are reviewed at length with the patient today.  Concerns regarding medicines are outlined above.  Orders Placed This Encounter  Procedures  . EKG 12-Lead   Medication changes: No orders of the defined types were placed in this encounter.   Signed, Park Liter, MD, [facility] 06/03/2020 2:36 PM    [place]

## 2020-06-03 NOTE — Patient Instructions (Signed)

## 2020-06-05 DIAGNOSIS — K219 Gastro-esophageal reflux disease without esophagitis: Secondary | ICD-10-CM | POA: Diagnosis not present

## 2020-06-12 DIAGNOSIS — K219 Gastro-esophageal reflux disease without esophagitis: Secondary | ICD-10-CM | POA: Diagnosis not present

## 2020-06-12 DIAGNOSIS — I259 Chronic ischemic heart disease, unspecified: Secondary | ICD-10-CM | POA: Diagnosis not present

## 2020-06-12 DIAGNOSIS — I10 Essential (primary) hypertension: Secondary | ICD-10-CM | POA: Diagnosis not present

## 2020-06-12 DIAGNOSIS — E039 Hypothyroidism, unspecified: Secondary | ICD-10-CM | POA: Diagnosis not present

## 2020-07-20 ENCOUNTER — Other Ambulatory Visit: Payer: Self-pay | Admitting: Cardiology

## 2020-07-23 ENCOUNTER — Telehealth: Payer: Self-pay | Admitting: Cardiology

## 2020-07-23 NOTE — Telephone Encounter (Signed)
*  STAT* If patient is at the pharmacy, call can be transferred to refill team.   1. Which medications need to be refilled? (please list name of each medication and dose if known) Levothyroxine  2. Which pharmacy/location (including street and city if local pharmacy) is medication to be sent to? [facility] [place] [facility]  3. Do they need a 30 day or 90 day supply? 90 days and refills

## 2020-07-23 NOTE — Telephone Encounter (Signed)
Pharmacy notified to contact PCP for refills./cy

## 2020-07-23 NOTE — Telephone Encounter (Signed)
Will forward to Dr Agustin Cree to see if should continue to fill or have filled by PCP .Adonis Housekeeper

## 2020-07-23 NOTE — Telephone Encounter (Signed)
Shold be done by PMD

## 2020-07-29 DIAGNOSIS — H43813 Vitreous degeneration, bilateral: Secondary | ICD-10-CM | POA: Diagnosis not present

## 2020-08-05 ENCOUNTER — Telehealth: Payer: Self-pay | Admitting: Cardiology

## 2020-08-05 NOTE — Telephone Encounter (Signed)
STAT if HR is under 50 or over 120 (normal HR is 60-100 beats per minute)  1) What is your heart rate? 51  2) Do you have a log of your heart rate readings (document readings)? 44,50, 55  3) Do you have any other symptoms? Tired, weakness    Pt states his HR is kind of going up and down like a yo-yo. When he is up and oving around it is higher but once he sits down and relaxes it goes back down. Dr. Raliegh Ip adjusted his medication a while back. The patient is scheduled to see Dr. Raliegh Ip tomorrow 08/06/20 at 1:00 pm

## 2020-08-06 ENCOUNTER — Ambulatory Visit (INDEPENDENT_AMBULATORY_CARE_PROVIDER_SITE_OTHER): Payer: Medicare Other

## 2020-08-06 ENCOUNTER — Other Ambulatory Visit: Payer: Self-pay

## 2020-08-06 ENCOUNTER — Encounter: Payer: Self-pay | Admitting: Cardiology

## 2020-08-06 ENCOUNTER — Ambulatory Visit: Payer: Medicare Other | Admitting: Cardiology

## 2020-08-06 VITALS — BP 148/70 | HR 44 | Ht 63.0 in | Wt 193.0 lb

## 2020-08-06 DIAGNOSIS — E210 Primary hyperparathyroidism: Secondary | ICD-10-CM

## 2020-08-06 DIAGNOSIS — R001 Bradycardia, unspecified: Secondary | ICD-10-CM

## 2020-08-06 DIAGNOSIS — I340 Nonrheumatic mitral (valve) insufficiency: Secondary | ICD-10-CM

## 2020-08-06 DIAGNOSIS — I10 Essential (primary) hypertension: Secondary | ICD-10-CM

## 2020-08-06 DIAGNOSIS — Z955 Presence of coronary angioplasty implant and graft: Secondary | ICD-10-CM

## 2020-08-06 NOTE — Progress Notes (Signed)
Cardiology Office Note:    Date:  08/06/2020   ID:  Ryan Mendoza, DOB January 10, 1934, MRN [id number]  PCP:  Angelina Sheriff, MD  Cardiologist:  Jenne Campus, MD    Referring MD: Angelina Sheriff, MD   No chief complaint on file. I am weak and tired  History of Present Illness:    Ryan Mendoza is a 86 y.o. male with past medical history significant for coronary artery disease he did have PTCA and stenting of the LAD and circumflex artery done in 2017.  Last evaluation of his coronary artery disease was done by cardiac catheterization January 2020 he was in [place] and going to hospital cardiac catheterization was done which showed no significant coronary disease.  He is been struggling lately with bradycardia.  We tried to withdrawn all sinus node suppressing agent as well as AV node suppressing agent in spite of that he still appears to be bradycardic.  He complained of being weak tired and exhausted I am worried that his bradycardia may become symptomatic.  Also he tells me today that for last 3 weeks he ran out of his Synthroid.  Obviously that is concerning factor.  He denies have any syncope just weakness fatigue.  No dizziness no passing out.  No chest pain.  Past Medical History:  Diagnosis Date  . Anxiety   . Aortic valve regurgitation   . Arthritis    hands and knees  . BPH (benign prostatic hyperplasia)   . Bradycardia 11/22/2019  . Cataract, left eye   . Claustrophobia   . Coronary artery disease   . Coronary artery disease involving native coronary artery of native heart with angina pectoris ([place]) 12/08/2018   PTCA and stenting of LAD and circumflex in 2017  Formatting of this note might be different from the original. PTCA and drug-eluting stent to LAD as well as the circumflex in June 2017  . Depression    no current meds for  . Dyspnea    DOE  . Dyspnea on exertion 12/08/2018  . Elevated cholesterol   . Essential hypertension 06/19/2019  . Gallstones   . GERD  (gastroesophageal reflux disease)   . History of kidney stones   . Hyperparathyroidism, primary ([place]) 09/27/2017  . Hypertension   . Hypothyroidism   . Mitral regurgitation 12/08/2018   Mild to moderate by echo from August 2019  . Myocardial infarction ([place])    SILENT  . Obstructive sleep apnea 12/08/2018  . Primary hyperparathyroidism ([place])   . 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 Status post coronary artery stent placement 12/21/2017   Formatting of this note might be different from the original. LAD and Cx 2017  . Tinnitus    both ears all the time    Past Surgical History:  Procedure Laterality Date  . CATARACT EXTRACTION W/PHACO Left 04/12/2018   Procedure: CATARACT EXTRACTION PHACO AND INTRAOCULAR LENS PLACEMENT (IOC);  Surgeon: Birder Robson, MD;  Location: [facility] ORS;  Service: Ophthalmology;  Laterality: Left;  [place] 00:39.5 AP% 16.7 CDE 6.62 Fluid Pack Lot # [id number]  . CORONARY ANGIOPLASTY     2017  . EYE SURGERY Right    ioc for cataract  . PARATHYROIDECTOMY Right 09/28/2017   Procedure: RIGHT INFERIOR PARATHYROIDECTOMY;  Surgeon: Armandina Gemma, MD;  Location: [facility];  Service: General;  Laterality: Right;  . right eye  plug for tear duct surgery    .  stents to heart x 2  06/2016    Current Medications: Current Meds  Medication Sig  . albuterol (VENTOLIN HFA) 108 (90 Base) MCG/ACT inhaler Inhale 1-2 puffs into the lungs every 4 (four) hours as needed.  Marland Kitchen amLODipine (NORVASC) 5 MG tablet TAKE 1 TABLET BY MOUTH EVERYDAY AT BEDTIME  . aspirin EC 81 MG tablet Take 81 mg by mouth at bedtime.   Marland Kitchen atorvastatin (LIPITOR) 40 MG tablet Take 1 tablet (40 mg total) by mouth at bedtime.  . busPIRone (BUSPAR) 5 MG tablet Take 5 mg by mouth 3 (three) times daily.  . cloNIDine (CATAPRES) 0.2 MG tablet Take 0.2 mg by mouth 2 (two) times daily.  . furosemide (LASIX) 20 MG tablet TAKE 1 TABLET BY MOUTH EVERY DAY  . levothyroxine  (SYNTHROID) 75 MCG tablet TAKE 1 TABLET (75 MCG TOTAL) BY MOUTH AT BEDTIME.  . nitroGLYCERIN (NITROSTAT) 0.4 MG SL tablet Place 1 tablet (0.4 mg total) under the tongue every 5 (five) minutes as needed for chest pain.  Marland Kitchen omeprazole (PRILOSEC) 40 MG capsule Take 40 mg by mouth every morning.  . ondansetron (ZOFRAN-ODT) 4 MG disintegrating tablet Take 4 mg by mouth every 8 (eight) hours as needed.  . pantoprazole (PROTONIX) 40 MG tablet Take 40 mg by mouth 2 (two) times daily.     Allergies:   Patient has no known allergies.   Social History   Socioeconomic History  . Marital status: Married    Spouse name: Not on file  . Number of children: Not on file  . Years of education: Not on file  . Highest education level: Not on file  Occupational History  . Not on file  Tobacco Use  . Smoking status: Former Smoker    Packs/day: 1.00    Years: 10.00    Pack years: 10.00    Types: Cigarettes  . Smokeless tobacco: Never Used  . Tobacco comment: quit 1974  Vaping Use  . Vaping Use: Never used  Substance and Sexual Activity  . Alcohol use: No  . Drug use: No  . Sexual activity: Not on file  Other Topics Concern  . Not on file  Social History Narrative  . Not on file   Social Determinants of Health   Financial Resource Strain:   . Difficulty of Paying Living Expenses: Not on file  Food Insecurity:   . Worried About Charity fundraiser in the Last Year: Not on file  . Ran Out of Food in the Last Year: Not on file  Transportation Needs:   . Lack of Transportation (Medical): Not on file  . Lack of Transportation (Non-Medical): Not on file  Physical Activity:   . Days of Exercise per Week: Not on file  . Minutes of Exercise per Session: Not on file  Stress:   . Feeling of Stress : Not on file  Social Connections:   . Frequency of Communication with Friends and Family: Not on file  . Frequency of Social Gatherings with Friends and Family: Not on file  . Attends Religious  Services: Not on file  . Active Member of Clubs or Organizations: Not on file  . Attends Archivist Meetings: Not on file  . Marital Status: Not on file     Family History: The patient's family history includes Heart attack in his father and mother; Heart disease in his father and mother. ROS:   Please see the history of present illness.    All 14 point  review of systems negative except as described per history of present illness  EKGs/Labs/Other Studies Reviewed:      Recent Labs: 11/08/2019: BUN 23; Creatinine, Ser 1.64; Hemoglobin 15.2; NT-Pro BNP 534; Platelets 183; Potassium 4.9; Sodium 141; TSH 5.520  Recent Lipid Panel No results found for: CHOL, TRIG, HDL, CHOLHDL, VLDL, LDLCALC, LDLDIRECT  Physical Exam:    VS:  BP (!) 148/70   Pulse (!) 44   Ht 5\' 3"  (1.6 m)   Wt 193 lb (87.5 kg)   SpO2 97%   BMI 34.19 kg/m     Wt Readings from Last 3 Encounters:  08/06/20 193 lb (87.5 kg)  06/03/20 189 lb (85.7 kg)  04/04/20 189 lb 3.2 oz (85.8 kg)     GEN:  Well nourished, well developed in no acute distress HEENT: Normal NECK: No JVD; No carotid bruits LYMPHATICS: No lymphadenopathy CARDIAC: RRR, systolic murmur grade 2/6 best heard at the apex, no rubs, no gallops RESPIRATORY:  Clear to auscultation without rales, wheezing or rhonchi  ABDOMEN: Soft, non-tender, non-distended MUSCULOSKELETAL:  No edema; No deformity  SKIN: Warm and dry LOWER EXTREMITIES: no swelling NEUROLOGIC:  Alert and oriented x 3 PSYCHIATRIC:  Normal affect   ASSESSMENT:    1. Bradycardia   2. Status post coronary artery stent placement   3. Essential hypertension   4. Nonrheumatic mitral valve regurgitation   5. Hyperparathyroidism, primary ([place])    PLAN:    In order of problems listed above:  1. Bradycardia the mechanism is sinus.  All sinus node suppressing agent has been withdrawn.  He still on clonidine but we get finally good control of his blood pressure prefer not to  discontinue that medication.  He also ran out of his Synthroid.  I will check his TSH today.  I will ask him to wear another Zio patch for 3 days to see how significant that bradycardia is.  Also I will try to correlate his symptoms with bradycardia.  He will be referred to EP for evaluation for potential pacemaker implant. 2. Status post PTCA and stenting.  Stable from that point review on appropriate medications. 3. Essential hypertension: Finally we got reasonably controlled blood pressure, that is the reason why I prefer not to discontinue his clonidine. 4. Nonrheumatic mitral valve regurgitation last time moderate.  We will continue with blood pressure management.  Asymptomatic. 5. Overall situation is quite complicated.  Will try to look for any reversible reason for his bradycardia but he may end up needing pacemaker.  He appears to have symptomatic sinus bradycardia with weakness and fatigue.   Medication Adjustments/Labs and Tests Ordered: Current medicines are reviewed at length with the patient today.  Concerns regarding medicines are outlined above.  Orders Placed This Encounter  Procedures  . TSH  . Ambulatory referral to Cardiac Electrophysiology  . LONG TERM MONITOR (3-14 DAYS)  . EKG 12-Lead   Medication changes: No orders of the defined types were placed in this encounter.   Signed, Park Liter, MD, [facility] 08/06/2020 1:30 PM    [place]

## 2020-08-06 NOTE — Patient Instructions (Signed)
Medication Instructions:  Your physician recommends that you continue on your current medications as directed. Please refer to the Current Medication list given to you today.  *If you need a refill on your cardiac medications before your next appointment, please call your pharmacy*   Lab Work: Tsh- Today   If you have labs (blood work) drawn today and your tests are completely normal, you will receive your results only by: Marland Kitchen MyChart Message (if you have MyChart) OR . A paper copy in the mail If you have any lab test that is abnormal or we need to change your treatment, we will call you to review the results.   Testing/Procedures: Your physician has referred you to see Dr. Allegra Lai for a pacemaker   A zio monitor was ordered today. It will remain on for 3 days. You will then return monitor and event diary in provided box. It takes 1-2 weeks for report to be downloaded and returned to [place]. We will call you with the results. If monitor falls off or has orange flashing light, please call Zio for further instructions.      Follow-Up: At [facility], you and your health needs are our priority.  As part of our continuing mission to provide you with exceptional heart care, we have created designated Provider Care Teams.  These Care Teams include your primary Cardiologist (physician) and Advanced Practice Providers (APPs -  Physician Assistants and Nurse Practitioners) who all work together to provide you with the care you need, when you need it.  We recommend signing up for the patient portal called "MyChart".  Sign up information is provided on this After Visit Summary.  MyChart is used to connect with patients for Virtual Visits (Telemedicine).  Patients are able to view lab/test results, encounter notes, upcoming appointments, etc.  Non-urgent messages can be sent to your provider as well.   To learn more about what you can do with MyChart, go to [email or web address].    Your next  appointment:   2 month(s)  The format for your next appointment:   In Person  Provider:   Jenne Campus, MD   Other Instructions None

## 2020-08-07 LAB — TSH: TSH: 7.3 u[IU]/mL — ABNORMAL HIGH (ref 0.450–4.500)

## 2020-08-08 ENCOUNTER — Telehealth (INDEPENDENT_AMBULATORY_CARE_PROVIDER_SITE_OTHER): Payer: Medicare Other | Admitting: Internal Medicine

## 2020-08-08 ENCOUNTER — Telehealth: Payer: Self-pay | Admitting: Cardiology

## 2020-08-08 DIAGNOSIS — R001 Bradycardia, unspecified: Secondary | ICD-10-CM

## 2020-08-08 DIAGNOSIS — I2510 Atherosclerotic heart disease of native coronary artery without angina pectoris: Secondary | ICD-10-CM

## 2020-08-08 DIAGNOSIS — I340 Nonrheumatic mitral (valve) insufficiency: Secondary | ICD-10-CM | POA: Diagnosis not present

## 2020-08-08 NOTE — Telephone Encounter (Signed)
STAT if HR is under 50 or over 120 (normal HR is 60-100 beats per minute)  1) What is your heart rate? 40- Pt have an  appt on 08-15-20- daughter says he needs to be seen sooner  2) Do you have a log of your heart rate readings (document readings)? yes  3) Do you have any other symptoms? Weakness in his legs, extremely short of breath and really tired

## 2020-08-08 NOTE — Patient Instructions (Addendum)
Medication Instructions:  Your physician has recommended you make the following change in your medication:   ** Begin Clonidine 0.1mg  - 1 tablet by mouth twice daily.    *If you need a refill on your cardiac medications before your next appointment, please call your pharmacy*   Lab Work: None ordered. If you have labs (blood work) drawn today and your tests are completely normal, you will receive your results only by: Marland Kitchen MyChart Message (if you have MyChart) OR . A paper copy in the mail If you have any lab test that is abnormal or we need to change your treatment, we will call you to review the results.   Testing/Procedures:  Continue ZIO XT monitoring  Follow-Up: At [facility] you and your health needs are our priority.  As part of our continuing mission to provide you with exceptional heart care, we have created designated Provider Care Teams.  These Care Teams include your primary Cardiologist (physician) and Advanced Practice Providers (APPs -  Physician Assistants and Nurse Practitioners) who all work together to provide you with the care you need, when you need it.  We recommend signing up for the patient portal called "MyChart".  Sign up information is provided on this After Visit Summary.  MyChart is used to connect with patients for Virtual Visits (Telemedicine).  Patients are able to view lab/test results, encounter notes, upcoming appointments, etc.  Non-urgent messages can be sent to your provider as well.   To learn more about what you can do with MyChart, go to [email or web address].    Your next appointment:  Tuesday 08/13/2020 at 415pm.  Pt's daughter notified of appointment date and time by A. Oletta Lamas

## 2020-08-08 NOTE — Telephone Encounter (Signed)
Pts daughter states he is weak, fatigued and does not feel well. Since he is symptomatic, he has agreed to a virtual visit with Dr. Caryl Comes this afternoon to discuss PPM implant. Please call his cell number at [phone].

## 2020-08-08 NOTE — Progress Notes (Signed)
Electrophysiology TeleHealth Note   Due to national recommendations of social distancing due to COVID 19, an audio/video telehealth visit is felt to be most appropriate for this patient at this time.  See MyChart message from today for the patient's consent to telehealth for [facility].   Date:  08/08/2020   ID:  Ryan Mendoza, DOB 24-Mar-1934, MRN [id number]  Location: patient's home  Provider location: [address] [place]  Evaluation Performed: Follow-up visit  PCP:  Angelina Sheriff, MD  Cardiologist:   [place] Electrophysiologist:  SK   Chief Complaint:  Shortness of breath  History of Present Illness:    Ryan Mendoza is a 86 y.o. male who presents via audio/video conferencing for a telehealth visit today in consultation in our clinic for bradycardia   Complaining of dypnea and fatigue-- previously able to get into the 50;s    Has been checking HR regularly for the last year, and the last week But now stuck in the 33s Hx of CAD with prior stenting of LAD/ CX 2017  Recently run out of his thyroid replacement      DATE TEST EF    LHC  LAD/CX PCI  1/20 LHC   No obstruction  3/21 Echo  55-60 % LAE-severe (55 ml/m2) MR mod-eccentric        Date Cr K TSH Hgb  8/21 1.64 4.9 7.3 15.2    The patient denies symptoms of fevers, chills, cough, or new SOB worrisome for COVID 19.    Past Medical History:  Diagnosis Date  . Anxiety   . Aortic valve regurgitation   . Arthritis    hands and knees  . BPH (benign prostatic hyperplasia)   . Bradycardia 11/22/2019  . Cataract, left eye   . Claustrophobia   . Coronary artery disease   . Coronary artery disease involving native coronary artery of native heart with angina pectoris ([place]) 12/08/2018   PTCA and stenting of LAD and circumflex in 2017  Formatting of this note might be different from the original. PTCA and drug-eluting stent to LAD as well as the circumflex in June 2017  . Depression    no  current meds for  . Dyspnea    DOE  . Dyspnea on exertion 12/08/2018  . Elevated cholesterol   . Essential hypertension 06/19/2019  . Gallstones   . GERD (gastroesophageal reflux disease)   . History of kidney stones   . Hyperparathyroidism, primary ([place]) 09/27/2017  . Hypertension   . Hypothyroidism   . Mitral regurgitation 12/08/2018   Mild to moderate by echo from August 2019  . Myocardial infarction ([place])    SILENT  . Obstructive sleep apnea 12/08/2018  . Primary hyperparathyroidism ([place])   . 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 Status post coronary artery stent placement 12/21/2017   Formatting of this note might be different from the original. LAD and Cx 2017  . Tinnitus    both ears all the time    Past Surgical History:  Procedure Laterality Date  . CATARACT EXTRACTION W/PHACO Left 04/12/2018   Procedure: CATARACT EXTRACTION PHACO AND INTRAOCULAR LENS PLACEMENT (IOC);  Surgeon: Birder Robson, MD;  Location: [facility] ORS;  Service: Ophthalmology;  Laterality: Left;  [place] 00:39.5 AP% 16.7 CDE 6.62 Fluid Pack Lot # [id number]  . CORONARY ANGIOPLASTY     2017  . EYE SURGERY Right  ioc for cataract  . PARATHYROIDECTOMY Right 09/28/2017   Procedure: RIGHT INFERIOR PARATHYROIDECTOMY;  Surgeon: Armandina Gemma, MD;  Location: [facility];  Service: General;  Laterality: Right;  . right eye  plug for tear duct surgery    . stents to heart x 2  06/2016    Current Outpatient Medications  Medication Sig Dispense Refill  . albuterol (VENTOLIN HFA) 108 (90 Base) MCG/ACT inhaler Inhale 1-2 puffs into the lungs every 4 (four) hours as needed.    Marland Kitchen amLODipine (NORVASC) 5 MG tablet TAKE 1 TABLET BY MOUTH EVERYDAY AT BEDTIME 90 tablet 3  . aspirin EC 81 MG tablet Take 81 mg by mouth at bedtime.     Marland Kitchen atorvastatin (LIPITOR) 40 MG tablet Take 1 tablet (40 mg total) by mouth at bedtime. 90 tablet 1  . busPIRone (BUSPAR) 5 MG tablet Take 5 mg by mouth  3 (three) times daily.    . cloNIDine (CATAPRES) 0.2 MG tablet Take 0.2 mg by mouth 2 (two) times daily.    . furosemide (LASIX) 20 MG tablet TAKE 1 TABLET BY MOUTH EVERY DAY 90 tablet 1  . nitroGLYCERIN (NITROSTAT) 0.4 MG SL tablet Place 1 tablet (0.4 mg total) under the tongue every 5 (five) minutes as needed for chest pain. 25 tablet 3  . omeprazole (PRILOSEC) 40 MG capsule Take 40 mg by mouth every morning.    . pantoprazole (PROTONIX) 40 MG tablet Take 40 mg by mouth 2 (two) times daily.    Marland Kitchen levothyroxine (SYNTHROID) 75 MCG tablet TAKE 1 TABLET (75 MCG TOTAL) BY MOUTH AT BEDTIME. (Patient taking differently: Take 50 mcg by mouth at bedtime. Pt taking 39mcg as Rx recently changed) 30 tablet 5  . ondansetron (ZOFRAN-ODT) 4 MG disintegrating tablet Take 4 mg by mouth every 8 (eight) hours as needed. (Patient not taking: Reported on 08/08/2020)     No current facility-administered medications for this visit.    Allergies:   Patient has no known allergies.   Social History:  The patient  reports that he has quit smoking. His smoking use included cigarettes. He has a 10.00 pack-year smoking history. He has never used smokeless tobacco. He reports that he does not drink alcohol and does not use drugs.   Family History:  The patient's   family history includes Heart attack in his father and mother; Heart disease in his father and mother.   ROS:  Please see the history of present illness.   All other systems are personally reviewed and negative.    Exam:    Vital Signs:  BP 136/67   Pulse (!) 44   Ht 5\' 8"  (1.727 m)   Wt 183 lb (83 kg)   BMI 27.83 kg/m     Well appearing, alert and conversant, regular work of breathing,  good skin color Eyes- anicteric, neuro- grossly intact, skin- no apparent rash or lesions or cyanosis, mouth- oral mucosa is pink   Labs/Other Tests and Data Reviewed:    Recent Labs: 11/08/2019: BUN 23; Creatinine, Ser 1.64; Hemoglobin 15.2; NT-Pro BNP 534;  Platelets 183; Potassium 4.9; Sodium 141 08/06/2020: TSH 7.300   Wt Readings from Last 3 Encounters:  08/08/20 183 lb (83 kg)  08/06/20 193 lb (87.5 kg)  06/03/20 189 lb (85.7 kg)     Other studies personally reviewed: Additional studies/ records that were reviewed today include As above  Prior radiographs:    ECG 8/21 sinus @ 44 6/21 sinus @ 55 4/21 sinus @ 47  3/21 sinus @ 51 ASSESSMENT & PLAN:    Sinus bradycardia   MR-moderate severe  CAD   There has been a patient appreciated trend of slowing of the heart rate.  Interestingly, in April there was an ECG with a heart rate of 47; however, in the month of March a event recorder demonstrated mean heart rates in the 60s and 70s.  More recently the heart rates have been in the 40s with little change.  This is been coincidental with up titration of his clonidine from 0.1--0.2 mg a day.  This has been accompanied by further dyspnea.  It is also noteworthy that he had significant MR noted in March on transthoracic echo with at least moderate MR-that was eccentric and severe left atrial enlargement suggesting that the MR might be more severe.  This might also be contributing to his dyspnea.  Given the size of his left atrium I was surprised that his ECGs demonstrated sinus rhythm.  We have discussed alternative strategies related to his bradycardia.  We are in agreement to reduce his clonidine back to 0.1 mg twice daily and reassess next week.  If he continues to have significant bradycardia we will plan to review pacing.  He is currently wearing a Zio patch.  I have asked him to send it back next Friday so we will have data both on the 0.2 twice a day and the 0.1 twice a day dosing.  I have also spoken to Dr. Agustin Cree who will review his echo and pursue further evaluation of his MR.    COVID 19 screen The patient denies symptoms of COVID 19 at this time.  The importance of social distancing was discussed today.  Follow-up: telehealth  tues    Current medicines are reviewed at length with the patient today.   The patient does not have concerns regarding his medicines.  The following changes were made today:  Resume ( refill) clonidine at 0.1 mg bid  Labs/ tests ordered today include: continue the XT monitor  No orders of the defined types were placed in this encounter.     Patient Risk:  after full review of this patients clinical status, I feel that they are at moderate risk at this time.  Today, I have spent 19 minutes with the patient with telehealth technology discussing the above.  Signed, Virl Axe, MD  08/08/2020 2:33 PM     [facility] [address] [place] [address] [phone] (office) [phone] (fax)

## 2020-08-09 MED ORDER — CLONIDINE HCL 0.1 MG PO TABS: 0.1000 mg | ORAL_TABLET | Freq: Two times a day (BID) | ORAL | 3 refills | Status: DC

## 2020-08-13 ENCOUNTER — Telehealth (INDEPENDENT_AMBULATORY_CARE_PROVIDER_SITE_OTHER): Payer: Medicare Other | Admitting: Internal Medicine

## 2020-08-13 ENCOUNTER — Telehealth: Payer: Medicare Other | Admitting: Internal Medicine

## 2020-08-13 ENCOUNTER — Other Ambulatory Visit: Payer: Self-pay

## 2020-08-13 DIAGNOSIS — I10 Essential (primary) hypertension: Secondary | ICD-10-CM | POA: Diagnosis not present

## 2020-08-13 DIAGNOSIS — I340 Nonrheumatic mitral (valve) insufficiency: Secondary | ICD-10-CM

## 2020-08-13 DIAGNOSIS — E039 Hypothyroidism, unspecified: Secondary | ICD-10-CM | POA: Diagnosis not present

## 2020-08-13 DIAGNOSIS — K219 Gastro-esophageal reflux disease without esophagitis: Secondary | ICD-10-CM | POA: Diagnosis not present

## 2020-08-13 DIAGNOSIS — R001 Bradycardia, unspecified: Secondary | ICD-10-CM

## 2020-08-13 DIAGNOSIS — I2510 Atherosclerotic heart disease of native coronary artery without angina pectoris: Secondary | ICD-10-CM | POA: Diagnosis not present

## 2020-08-13 DIAGNOSIS — I259 Chronic ischemic heart disease, unspecified: Secondary | ICD-10-CM | POA: Diagnosis not present

## 2020-08-13 NOTE — Progress Notes (Signed)
Electrophysiology TeleHealth Note   Due to national recommendations of social distancing due to COVID 19, an audio/video telehealth visit is felt to be most appropriate for this patient at this time.  See MyChart message from today for the patient's consent to telehealth for [facility].   Date:  08/13/2020   ID:  Ryan Mendoza, DOB 1934/08/04, MRN [id number]  Location: patient's home  Provider location: [address] [place]  Evaluation Performed: Follow-up visit  PCP:  Angelina Sheriff, MD  Cardiologist:   [place] Electrophysiologist:  SK   Chief Complaint:  Bradycardia and dyspnea  History of Present Illness:    Ryan Mendoza is a 86 y.o. male who presents via audio/video conferencing for a telehealth visit today.  Since last being week remotely for braydycardia and dyspnea at which point we decreased his clonidine 0.2--0.1 (is back to his baseline) the patient reports energy is better with HR 50s   Still with dyspnea.  But better  No edema  Also has at least moderate MR-eccentric.  Have spoken with Dr. Shelbie Hutching will be evaluating the severity of his MRI in the context particularly in the setting of his severe LAE  The patient denies symptoms of fevers, chills, cough, or new SOB worrisome for COVID 19.    Past Medical History:  Diagnosis Date  . Anxiety   . Aortic valve regurgitation   . Arthritis    hands and knees  . BPH (benign prostatic hyperplasia)   . Bradycardia 11/22/2019  . Cataract, left eye   . Claustrophobia   . Coronary artery disease   . Coronary artery disease involving native coronary artery of native heart with angina pectoris ([place]) 12/08/2018   PTCA and stenting of LAD and circumflex in 2017  Formatting of this note might be different from the original. PTCA and drug-eluting stent to LAD as well as the circumflex in June 2017  . Depression    no current meds for  . Dyspnea    DOE  . Dyspnea on exertion 12/08/2018  . Elevated  cholesterol   . Essential hypertension 06/19/2019  . Gallstones   . GERD (gastroesophageal reflux disease)   . History of kidney stones   . Hyperparathyroidism, primary ([place]) 09/27/2017  . Hypertension   . Hypothyroidism   . Mitral regurgitation 12/08/2018   Mild to moderate by echo from August 2019  . Myocardial infarction ([place])    SILENT  . Obstructive sleep apnea 12/08/2018  . Primary hyperparathyroidism ([place])   . 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 Status post coronary artery stent placement 12/21/2017   Formatting of this note might be different from the original. LAD and Cx 2017  . Tinnitus    both ears all the time    Past Surgical History:  Procedure Laterality Date  . CATARACT EXTRACTION W/PHACO Left 04/12/2018   Procedure: CATARACT EXTRACTION PHACO AND INTRAOCULAR LENS PLACEMENT (IOC);  Surgeon: Birder Robson, MD;  Location: [facility] ORS;  Service: Ophthalmology;  Laterality: Left;  [place] 00:39.5 AP% 16.7 CDE 6.62 Fluid Pack Lot # [id number]  . CORONARY ANGIOPLASTY     2017  . EYE SURGERY Right    ioc for cataract  . PARATHYROIDECTOMY Right 09/28/2017   Procedure: RIGHT INFERIOR PARATHYROIDECTOMY;  Surgeon: Armandina Gemma, MD;  Location: [facility];  Service: General;  Laterality: Right;  . right eye  plug for tear duct surgery    .  stents to heart x 2  06/2016    Current Outpatient Medications  Medication Sig Dispense Refill  . albuterol (VENTOLIN HFA) 108 (90 Base) MCG/ACT inhaler Inhale 1-2 puffs into the lungs every 4 (four) hours as needed.    Marland Kitchen amLODipine (NORVASC) 5 MG tablet TAKE 1 TABLET BY MOUTH EVERYDAY AT BEDTIME 90 tablet 3  . aspirin EC 81 MG tablet Take 81 mg by mouth at bedtime.     Marland Kitchen atorvastatin (LIPITOR) 40 MG tablet Take 1 tablet (40 mg total) by mouth at bedtime. 90 tablet 1  . busPIRone (BUSPAR) 5 MG tablet Take 5 mg by mouth 3 (three) times daily.    . cloNIDine (CATAPRES) 0.1 MG tablet Take 1 tablet (0.1  mg total) by mouth 2 (two) times daily. 180 tablet 3  . furosemide (LASIX) 20 MG tablet TAKE 1 TABLET BY MOUTH EVERY DAY 90 tablet 1  . levothyroxine (SYNTHROID) 75 MCG tablet TAKE 1 TABLET (75 MCG TOTAL) BY MOUTH AT BEDTIME. (Patient taking differently: Take 50 mcg by mouth at bedtime. Pt taking 31mcg as Rx recently changed) 30 tablet 5  . nitroGLYCERIN (NITROSTAT) 0.4 MG SL tablet Place 1 tablet (0.4 mg total) under the tongue every 5 (five) minutes as needed for chest pain. 25 tablet 3  . omeprazole (PRILOSEC) 40 MG capsule Take 40 mg by mouth every morning.    . ondansetron (ZOFRAN-ODT) 4 MG disintegrating tablet Take 4 mg by mouth every 8 (eight) hours as needed. (Patient not taking: Reported on 08/08/2020)    . pantoprazole (PROTONIX) 40 MG tablet Take 40 mg by mouth 2 (two) times daily.     No current facility-administered medications for this visit.    Allergies:   Patient has no known allergies.   Social History:  The patient  reports that he has quit smoking. His smoking use included cigarettes. He has a 10.00 pack-year smoking history. He has never used smokeless tobacco. He reports that he does not drink alcohol and does not use drugs.   Family History:  The patient's   family history includes Heart attack in his father and mother; Heart disease in his father and mother.   ROS:  Please see the history of present illness.   All other systems are personally reviewed and negative.    Exam:    Vital Signs:   HR 56   Labs/Other Tests and Data Reviewed:    Recent Labs: 11/08/2019: BUN 23; Creatinine, Ser 1.64; Hemoglobin 15.2; NT-Pro BNP 534; Platelets 183; Potassium 4.9; Sodium 141 08/06/2020: TSH 7.300   Wt Readings from Last 3 Encounters:  08/08/20 183 lb (83 kg)  08/06/20 193 lb (87.5 kg)  06/03/20 189 lb (85.7 kg)     Other studies personally reviewed:  *    ASSESSMENT & PLAN:    Sinus bradycardia   MR-moderate severe  CAD  Hypertension    The  patient's heart rates are much improved on the lower dose of clonidine.  He continues to have dyspnea.  Not sure this will be related to chronotropic incompetence or to his MR.  We will plan to review his event recorder.  Do off this Friday.  We will schedule telehealth in 2 weeks.  Dr. Agustin Cree will be following up to evaluate MR.    COVID 19 screen The patient denies symptoms of COVID 19 at this time.  The importance of social distancing was discussed today.  Follow-up:  2wk there are no needed    Current  medicines are reviewed at length with the patient today.   The patient does not have concerns regarding his medicines.  The following changes were made today:  none  Labs/ tests ordered today include:   No orders of the defined types were placed in this encounter.   Future tests ( post COVID )    Patient Risk:  after full review of this patients clinical status, I feel that they are at moderate  risk at this time.  Today, I have spent 35minutes with the patient with telehealth technology discussing the above.  Signed, Virl Axe, MD  08/13/2020 4:36 PM     [place] [address] [phone] (office) [phone] (fax)

## 2020-08-15 ENCOUNTER — Institutional Professional Consult (permissible substitution): Payer: Medicare Other | Admitting: Cardiology

## 2020-08-16 ENCOUNTER — Telehealth: Payer: Self-pay | Admitting: Cardiology

## 2020-08-16 NOTE — Telephone Encounter (Signed)
Patient's daughter calling to find out if Dr. Agustin Cree called the patient about his leaking heart valve.

## 2020-08-16 NOTE — Telephone Encounter (Signed)
Left message for patient to return call.

## 2020-08-20 NOTE — Telephone Encounter (Signed)
We may be able to see him sooner when I will have some availability

## 2020-08-20 NOTE — Telephone Encounter (Signed)
Yes, he needs f/up to talk about TEE, so lets schedule him for a visit

## 2020-08-20 NOTE — Telephone Encounter (Signed)
Called and spoke to patient's daughter per dpr. She reports that Dr. Caryl Comes was supposed to reach out to Dr. Raliegh Ip about this patient in regards to the patient's heart valve. Will consult with Dr. Agustin Cree to see if this was addressed.

## 2020-08-21 ENCOUNTER — Telehealth: Payer: Self-pay | Admitting: Internal Medicine

## 2020-08-21 NOTE — Telephone Encounter (Signed)
Left message for daughter to return call. 

## 2020-08-21 NOTE — Telephone Encounter (Signed)
Please see previous phone encounter.

## 2020-08-21 NOTE — Telephone Encounter (Signed)
Called spoke to patient daughter per dpr. Got him scheduled for 09/19/20.

## 2020-08-21 NOTE — Telephone Encounter (Signed)
Follow up:     Patient daughter returning a call back.please call patient daughter.

## 2020-08-27 ENCOUNTER — Other Ambulatory Visit: Payer: Self-pay

## 2020-08-27 ENCOUNTER — Telehealth (INDEPENDENT_AMBULATORY_CARE_PROVIDER_SITE_OTHER): Payer: Medicare Other | Admitting: Internal Medicine

## 2020-08-27 DIAGNOSIS — I340 Nonrheumatic mitral (valve) insufficiency: Secondary | ICD-10-CM

## 2020-08-27 DIAGNOSIS — R001 Bradycardia, unspecified: Secondary | ICD-10-CM

## 2020-08-27 NOTE — Progress Notes (Signed)
Electrophysiology TeleHealth Note   Due to national recommendations of social distancing due to COVID 19, an audio/video telehealth visit is felt to be most appropriate for this patient at this time.  See MyChart message from today for the patient's consent to telehealth for [facility].   Date:  08/27/2020   ID:  Ryan Mendoza, DOB 27-Sep-1934, MRN [id number]  Location: patient's home  Provider location: [address], [place]  Evaluation Performed: Follow-up visit  PCP:  Angelina Sheriff, MD  Cardiologist:   [place] Electrophysiologist:  SK   Chief Complaint:  Bradycardia and dyspnea  History of Present Illness:    Ryan Mendoza is a 86 y.o. male who presents via audio/video conferencing for a telehealth visit today.  Since last being week remotely for braydycardia and dyspnea at which point we decreased his clonidine 0.2--0.1 (is back to his baseline) the patient reports energy is better heart rates are in the 50s.  Mowing the yard he got his heart rate up to 110.  Still fatigue.  No edema.  No chest pain.    Also has at least moderate MR-eccentric.  Have spoken with Dr. Shelbie Hutching will be evaluating the severity of his MRI in the context particularly in the setting of his severe LAE     Past Medical History:  Diagnosis Date  . Anxiety   . Aortic valve regurgitation   . Arthritis    hands and knees  . BPH (benign prostatic hyperplasia)   . Bradycardia 11/22/2019  . Cataract, left eye   . Claustrophobia   . Coronary artery disease   . Coronary artery disease involving native coronary artery of native heart with angina pectoris ([place]) 12/08/2018   PTCA and stenting of LAD and circumflex in 2017  Formatting of this note might be different from the original. PTCA and drug-eluting stent to LAD as well as the circumflex in June 2017  . Depression    no current meds for  . Dyspnea    DOE  . Dyspnea on exertion 12/08/2018  . Elevated cholesterol   . Essential  hypertension 06/19/2019  . Gallstones   . GERD (gastroesophageal reflux disease)   . History of kidney stones   . Hyperparathyroidism, primary ([place]) 09/27/2017  . Hypertension   . Hypothyroidism   . Mitral regurgitation 12/08/2018   Mild to moderate by echo from August 2019  . Myocardial infarction ([place])    SILENT  . Obstructive sleep apnea 12/08/2018  . Primary hyperparathyroidism ([place])   . 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 Status post coronary artery stent placement 12/21/2017   Formatting of this note might be different from the original. LAD and Cx 2017  . Tinnitus    both ears all the time    Past Surgical History:  Procedure Laterality Date  . CATARACT EXTRACTION W/PHACO Left 04/12/2018   Procedure: CATARACT EXTRACTION PHACO AND INTRAOCULAR LENS PLACEMENT (IOC);  Surgeon: Birder Robson, MD;  Location: [facility] ORS;  Service: Ophthalmology;  Laterality: Left;  [place] 00:39.5 AP% 16.7 CDE 6.62 Fluid Pack Lot # [id number]  . CORONARY ANGIOPLASTY     2017  . EYE SURGERY Right    ioc for cataract  . PARATHYROIDECTOMY Right 09/28/2017   Procedure: RIGHT INFERIOR PARATHYROIDECTOMY;  Surgeon: Armandina Gemma, MD;  [facility]: [facility];  Service: General;  Laterality: Right;  . right eye  plug for tear duct  surgery    . stents to heart x 2  06/2016    Current Outpatient Medications  Medication Sig Dispense Refill  . albuterol (VENTOLIN HFA) 108 (90 Base) MCG/ACT inhaler Inhale 1-2 puffs into the lungs every 4 (four) hours as needed.    Marland Kitchen amLODipine (NORVASC) 5 MG tablet TAKE 1 TABLET BY MOUTH EVERYDAY AT BEDTIME 90 tablet 3  . aspirin EC 81 MG tablet Take 81 mg by mouth at bedtime.     Marland Kitchen atorvastatin (LIPITOR) 40 MG tablet Take 1 tablet (40 mg total) by mouth at bedtime. 90 tablet 1  . busPIRone (BUSPAR) 5 MG tablet Take 5 mg by mouth 3 (three) times daily.    . cloNIDine (CATAPRES) 0.1 MG tablet Take 1 tablet (0.1 mg total) by mouth 2 (two)  times daily. 180 tablet 3  . furosemide (LASIX) 20 MG tablet TAKE 1 TABLET BY MOUTH EVERY DAY 90 tablet 1  . levothyroxine (SYNTHROID) 75 MCG tablet TAKE 1 TABLET (75 MCG TOTAL) BY MOUTH AT BEDTIME. (Patient taking differently: Take 50 mcg by mouth at bedtime. Pt taking 23mcg as Rx recently changed) 30 tablet 5  . nitroGLYCERIN (NITROSTAT) 0.4 MG SL tablet Place 1 tablet (0.4 mg total) under the tongue every 5 (five) minutes as needed for chest pain. 25 tablet 3  . omeprazole (PRILOSEC) 40 MG capsule Take 40 mg by mouth every morning.    . ondansetron (ZOFRAN-ODT) 4 MG disintegrating tablet Take 4 mg by mouth every 8 (eight) hours as needed. (Patient not taking: Reported on 08/08/2020)    . pantoprazole (PROTONIX) 40 MG tablet Take 40 mg by mouth 2 (two) times daily.     No current facility-administered medications for this visit.    Allergies:   Patient has no known allergies.   Social History:  The patient  reports that he has quit smoking. His smoking use included cigarettes. He has a 10.00 pack-year smoking history. He has never used smokeless tobacco. He reports that he does not drink alcohol and does not use drugs.   Family History:  The patient's   family history includes Heart attack in his father and mother; Heart disease in his father and mother.   ROS:  Please see the history of present illness.   All other systems are personally reviewed and negative.    Exam:   vitals taken  HR 56     Labs/Other Tests and Data Reviewed:    Recent Labs: 11/08/2019: BUN 23; Creatinine, Ser 1.64; Hemoglobin 15.2; NT-Pro BNP 534; Platelets 183; Potassium 4.9; Sodium 141 08/06/2020: TSH 7.300   Wt Readings from Last 3 Encounters:  08/08/20 183 lb (83 kg)  08/06/20 193 lb (87.5 kg)  06/03/20 189 lb (85.7 kg)     Other studies personally reviewed:  *    ASSESSMENT & PLAN:    Sinus bradycardia   MR-moderate severe  CAD  Hypertension    Patient feels overall better.  Still  some fatigue.  Heart rate however was able to get up to 110 with mowing the lawn.  This suggests adequate chronotropic competence.  At this juncture we will hold off on any further evaluation of his heart rate.  He is to see Dr. Shelbie Hutching next month.  Will defer to him evaluation and management of his MR.   COVID 19 screen The patient denies symptoms of COVID 19 at this time.  The importance of social distancing was discussed today.  Follow-up: 10-12 weeks    Current  medicines are reviewed at length with the patient today.   The patient hs no concerns regarding his medicines.  The following changes were made today:  none  Labs/ tests ordered today include:   No orders of the defined types were placed in this encounter.   Future tests ( post COVID )    Patient Risk:  after full review of this patients clinical status, I feel that they are at moderate  risk at this time.  Today, I have spent  8 minutes with the patient with telehealth technology discussing the above.  Signed, Virl Axe, MD  08/27/2020 3:37 PM     [place] [address] [phone] (office) [phone] (fax)

## 2020-09-02 ENCOUNTER — Other Ambulatory Visit: Payer: Self-pay | Admitting: Cardiology

## 2020-09-19 ENCOUNTER — Other Ambulatory Visit: Payer: Self-pay

## 2020-09-19 ENCOUNTER — Ambulatory Visit: Payer: Medicare Other | Admitting: Cardiology

## 2020-09-19 VITALS — BP 142/60 | HR 60 | Ht 69.0 in | Wt 191.8 lb

## 2020-09-19 DIAGNOSIS — R001 Bradycardia, unspecified: Secondary | ICD-10-CM

## 2020-09-19 DIAGNOSIS — I25119 Atherosclerotic heart disease of native coronary artery with unspecified angina pectoris: Secondary | ICD-10-CM | POA: Diagnosis not present

## 2020-09-19 DIAGNOSIS — I340 Nonrheumatic mitral (valve) insufficiency: Secondary | ICD-10-CM

## 2020-09-19 DIAGNOSIS — Z955 Presence of coronary angioplasty implant and graft: Secondary | ICD-10-CM

## 2020-09-19 DIAGNOSIS — I10 Essential (primary) hypertension: Secondary | ICD-10-CM | POA: Diagnosis not present

## 2020-09-19 MED ORDER — AMLODIPINE BESYLATE 10 MG PO TABS
10.0000 mg | ORAL_TABLET | Freq: Every day | ORAL | 1 refills | Status: DC
Start: 2020-09-19 — End: 2021-03-17

## 2020-09-19 NOTE — Patient Instructions (Signed)
Medication Instructions:  Your physician has recommended you make the following change in your medication:   INCREASE: Amlodipine 10 mg daily   *If you need a refill on your cardiac medications before your next appointment, please call your pharmacy*   Lab Work: None If you have labs (blood work) drawn today and your tests are completely normal, you will receive your results only by: . MyChart Message (if you have MyChart) OR . A paper copy in the mail If you have any lab test that is abnormal or we need to change your treatment, we will call you to review the results.    Testing/Procedures: None   Follow-Up: At [facility], you and your health needs are our priority.  As part of our continuing mission to provide you with exceptional heart care, we have created designated Provider Care Teams.  These Care Teams include your primary Cardiologist (physician) and Advanced Practice Providers (APPs -  Physician Assistants and Nurse Practitioners) who all work together to provide you with the care you need, when you need it.  We recommend signing up for the patient portal called "MyChart".  Sign up information is provided on this After Visit Summary.  MyChart is used to connect with patients for Virtual Visits (Telemedicine).  Patients are able to view lab/test results, encounter notes, upcoming appointments, etc.  Non-urgent messages can be sent to your provider as well.   To learn more about what you can do with MyChart, go to [email or web address].    Your next appointment:   3 month(s)  The format for your next appointment:   In Person  Provider:   Robert Krasowski, MD   Other Instructions    

## 2020-09-19 NOTE — Progress Notes (Signed)
Cardiology Office Note:    Date:  09/19/2020   ID:  Ryan Mendoza, DOB July 03, 1934, MRN [id number]  PCP:  Angelina Sheriff, MD  Cardiologist:  Jenne Campus, MD    Referring MD: Angelina Sheriff, MD   No chief complaint on file. I am doing slightly better  History of Present Illness:    Ryan Mendoza is a 86 y.o. male with past medical history significant for coronary artery disease he did have PTCA and stenting of the LAD and circumflex artery done in 2017.  Last evaluation of his coronary artery disease was done by cardiac catheterization January 2020 he was in [place] and going to hospital cardiac catheterization was done which showed no significant coronary disease.  He is been struggling lately with bradycardia.  We tried to withdrawn all sinus node suppressing agent as well as AV node suppressing agent in spite of that he still appears to be bradycardic.  He complained of being weak tired and exhausted I am worried that his bradycardia may become symptomatic.  He was referred to EP team for consideration of antiarrhythmic therapy as well as pacemaker.  However the feeling was that his bradycardia was not sufficient enough to justify pacing.  Another issue has been raised that may be his mitral regurgitation which appears to be moderate and transthoracic echocardiogram is more that what we see.  Plans are to pursue transesophageal echocardiogram to more precisely assess degree of mitral regurgitation.  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  I think we can good job she is New York Heart Association class II.  Denies having any syncope or passing out.  Past Medical History:  Diagnosis Date  . Anxiety   . Aortic valve regurgitation   . Arthritis    hands and knees  . BPH (benign prostatic hyperplasia)   . Bradycardia 11/22/2019  .  Cataract, left eye   . Claustrophobia   . Coronary artery disease   . Coronary artery disease involving native coronary artery of native heart with angina pectoris ([place]) 12/08/2018   PTCA and stenting of LAD and circumflex in 2017  Formatting of this note might be different from the original. PTCA and drug-eluting stent to LAD as well as the circumflex in June 2017  . Depression    no current meds for  . Dyspnea    DOE  . Dyspnea on exertion 12/08/2018  . Elevated cholesterol   . Essential hypertension 06/19/2019  . Gallstones   . GERD (gastroesophageal reflux disease)   . History of kidney stones   . Hyperparathyroidism, primary ([place]) 09/27/2017  . Hypertension   . Hypothyroidism   . Mitral regurgitation 12/08/2018   Mild to moderate by echo from August 2019  . Myocardial infarction ([place])    SILENT  . Obstructive sleep apnea 12/08/2018  . Primary hyperparathyroidism ([place])   . 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 Status post coronary artery stent placement 12/21/2017   Formatting of this note might be different from the original. LAD and Cx 2017  . Tinnitus    both ears all the time    Past Surgical History:  Procedure Laterality Date  . CATARACT EXTRACTION W/PHACO Left 04/12/2018   Procedure: CATARACT EXTRACTION PHACO AND INTRAOCULAR LENS  PLACEMENT (IOC);  Surgeon: Birder Robson, MD;  Location: [facility] ORS;  Service: Ophthalmology;  Laterality: Left;  [place] 00:39.5 AP% 16.7 CDE 6.62 Fluid Pack Lot # [id number]  . CORONARY ANGIOPLASTY     2017  . EYE SURGERY Right    ioc for cataract  . PARATHYROIDECTOMY Right 09/28/2017   Procedure: RIGHT INFERIOR PARATHYROIDECTOMY;  Surgeon: Armandina Gemma, MD;  Location: [facility] ORS;  Service: General;  Laterality: Right;  . right eye  plug for tear duct surgery    . stents to heart x 2  06/2016    Current Medications: Current Meds  Medication Sig  . amLODipine (NORVASC) 10 MG tablet Take 1 tablet  (10 mg total) by mouth daily.  Marland Kitchen aspirin EC 81 MG tablet Take 81 mg by mouth at bedtime.   Marland Kitchen atorvastatin (LIPITOR) 40 MG tablet TAKE 1 TABLET BY MOUTH EVERYDAY AT BEDTIME  . busPIRone (BUSPAR) 5 MG tablet Take 5 mg by mouth 3 (three) times daily.  . cloNIDine (CATAPRES) 0.1 MG tablet Take 1 tablet (0.1 mg total) by mouth 2 (two) times daily.  Marland Kitchen levothyroxine (SYNTHROID) 50 MCG tablet Take 50 mcg by mouth daily.  . nitroGLYCERIN (NITROSTAT) 0.4 MG SL tablet Place 1 tablet (0.4 mg total) under the tongue every 5 (five) minutes as needed for chest pain.  Marland Kitchen omeprazole (PRILOSEC) 40 MG capsule Take 40 mg by mouth every morning.  . pantoprazole (PROTONIX) 40 MG tablet Take 40 mg by mouth 2 (two) times daily.  . [DISCONTINUED] amLODipine (NORVASC) 5 MG tablet TAKE 1 TABLET BY MOUTH EVERYDAY AT BEDTIME     Allergies:   Patient has no known allergies.   Social History   Socioeconomic History  . Marital status: Married    Spouse name: Not on file  . Number of children: Not on file  . Years of education: Not on file  . Highest education level: Not on file  Occupational History  . Not on file  Tobacco Use  . Smoking status: Former Smoker    Packs/day: 1.00    Years: 10.00    Pack years: 10.00    Types: Cigarettes  . Smokeless tobacco: Never Used  . Tobacco comment: quit 1974  Vaping Use  . Vaping Use: Never used  Substance and Sexual Activity  . Alcohol use: No  . Drug use: No  . Sexual activity: Not on file  Other Topics Concern  . Not on file  Social History Narrative  . Not on file   Social Determinants of Health   Financial Resource Strain:   . Difficulty of Paying Living Expenses: Not on file  Food Insecurity:   . Worried About Charity fundraiser in the Last Year: Not on file  . Ran Out of Food in the Last Year: Not on file  Transportation Needs:   . Lack of Transportation (Medical): Not on file  . Lack of Transportation (Non-Medical): Not on file  Physical Activity:     . Days of Exercise per Week: Not on file  . Minutes of Exercise per Session: Not on file  Stress:   . Feeling of Stress : Not on file  Social Connections:   . Frequency of Communication with Friends and Family: Not on file  . Frequency of Social Gatherings with Friends and Family: Not on file  . Attends Religious Services: Not on file  . Active Member of Clubs or Organizations: Not on file  . Attends Archivist Meetings: Not on  file  . Marital Status: Not on file     Family History: The patient's family history includes Heart attack in his father and mother; Heart disease in his father and mother. ROS:   Please see the history of present illness.    All 14 point review of systems negative except as described per history of present illness  EKGs/Labs/Other Studies Reviewed:      Recent Labs: 11/08/2019: BUN 23; Creatinine, Ser 1.64; Hemoglobin 15.2; NT-Pro BNP 534; Platelets 183; Potassium 4.9; Sodium 141 08/06/2020: TSH 7.300  Recent Lipid Panel No results found for: CHOL, TRIG, HDL, CHOLHDL, VLDL, LDLCALC, LDLDIRECT  Physical Exam:    VS:  BP (!) 142/60 (BP Location: Left Arm, Patient Position: Sitting, Cuff Size: Normal)   Pulse 60   Ht 5\' 9"  (1.753 m)   Wt 191 lb 12.8 oz (87 kg)   SpO2 97%   BMI 28.32 kg/m     Wt Readings from Last 3 Encounters:  09/19/20 191 lb 12.8 oz (87 kg)  08/08/20 183 lb (83 kg)  08/06/20 193 lb (87.5 kg)     GEN:  Well nourished, well developed in no acute distress HEENT: Normal NECK: No JVD; No carotid bruits LYMPHATICS: No lymphadenopathy CARDIAC: RRR, holosystolic murmur grade 2/6 best heard at apex, no rubs, no gallops RESPIRATORY:  Clear to auscultation without rales, wheezing or rhonchi  ABDOMEN: Soft, non-tender, non-distended MUSCULOSKELETAL:  No edema; No deformity  SKIN: Warm and dry LOWER EXTREMITIES: no swelling NEUROLOGIC:  Alert and oriented x 3 PSYCHIATRIC:  Normal affect   ASSESSMENT:    1. Mitral valve  insufficiency, unspecified etiology   2. Coronary artery disease involving native coronary artery of native heart with angina pectoris ([place])   3. Essential hypertension   4. Status post coronary artery stent placement   5. Bradycardia    PLAN:    In order of problems listed above:  1. Mitral valve insufficiency.  We initiated conversation about transesophageal echocardiogram.  I will make sure his blood pressures well controlled before he had procedure done.  Again the purpose is to precisely assess degree of mitral regurgitation. 2. Coronary artery disease denies having a new issue no chest pain tightness squeezing pressure burning chest. 3. Essential hypertension blood pressure still slightly on the higher side I will double the dose of Norvasc to 10 mg daily.  I still will continue clonidine at 0.1 mg daily however of close concern is about bradycardia and what in the future I hope to do is to completely discontinue this medication. 4. Bradycardia: Still present however EP felt that it is not enough to justify pacing.  Will continue investigation.   Medication Adjustments/Labs and Tests Ordered: Current medicines are reviewed at length with the patient today.  Concerns regarding medicines are outlined above.  No orders of the defined types were placed in this encounter.  Medication changes:  Meds ordered this encounter  Medications  . amLODipine (NORVASC) 10 MG tablet    Sig: Take 1 tablet (10 mg total) by mouth daily.    Dispense:  90 tablet    Refill:  1    Signed, Park Liter, MD, [facility] 09/19/2020 12:16 PM    [place]

## 2020-09-20 DIAGNOSIS — Z20822 Contact with and (suspected) exposure to covid-19: Secondary | ICD-10-CM | POA: Diagnosis not present

## 2020-10-01 ENCOUNTER — Telehealth: Payer: Self-pay | Admitting: Emergency Medicine

## 2020-10-01 NOTE — Telephone Encounter (Signed)
Patient returning call.

## 2020-10-01 NOTE — Telephone Encounter (Signed)
Left message for patient to return call regarding getting scheduled for TEE.

## 2020-10-01 NOTE — Telephone Encounter (Signed)
Called patient informed him that we need appointment to set him up for a TEE. Patient scheduled.

## 2020-10-03 DIAGNOSIS — K219 Gastro-esophageal reflux disease without esophagitis: Secondary | ICD-10-CM | POA: Insufficient documentation

## 2020-10-03 DIAGNOSIS — F32A Depression, unspecified: Secondary | ICD-10-CM | POA: Insufficient documentation

## 2020-10-03 DIAGNOSIS — H9319 Tinnitus, unspecified ear: Secondary | ICD-10-CM | POA: Insufficient documentation

## 2020-10-03 DIAGNOSIS — K8020 Calculus of gallbladder without cholecystitis without obstruction: Secondary | ICD-10-CM | POA: Insufficient documentation

## 2020-10-03 DIAGNOSIS — I2510 Atherosclerotic heart disease of native coronary artery without angina pectoris: Secondary | ICD-10-CM | POA: Insufficient documentation

## 2020-10-03 DIAGNOSIS — I219 Acute myocardial infarction, unspecified: Secondary | ICD-10-CM | POA: Insufficient documentation

## 2020-10-03 DIAGNOSIS — E039 Hypothyroidism, unspecified: Secondary | ICD-10-CM | POA: Insufficient documentation

## 2020-10-03 DIAGNOSIS — E7800 Pure hypercholesterolemia, unspecified: Secondary | ICD-10-CM | POA: Insufficient documentation

## 2020-10-03 DIAGNOSIS — R0600 Dyspnea, unspecified: Secondary | ICD-10-CM | POA: Insufficient documentation

## 2020-10-03 DIAGNOSIS — M1990 Unspecified osteoarthritis, unspecified site: Secondary | ICD-10-CM | POA: Insufficient documentation

## 2020-10-03 DIAGNOSIS — E210 Primary hyperparathyroidism: Secondary | ICD-10-CM | POA: Insufficient documentation

## 2020-10-03 DIAGNOSIS — I351 Nonrheumatic aortic (valve) insufficiency: Secondary | ICD-10-CM | POA: Insufficient documentation

## 2020-10-03 DIAGNOSIS — Z87442 Personal history of urinary calculi: Secondary | ICD-10-CM | POA: Insufficient documentation

## 2020-10-03 DIAGNOSIS — I10 Essential (primary) hypertension: Secondary | ICD-10-CM | POA: Insufficient documentation

## 2020-10-03 DIAGNOSIS — N400 Enlarged prostate without lower urinary tract symptoms: Secondary | ICD-10-CM | POA: Insufficient documentation

## 2020-10-03 DIAGNOSIS — H269 Unspecified cataract: Secondary | ICD-10-CM | POA: Insufficient documentation

## 2020-10-03 DIAGNOSIS — R234 Changes in skin texture: Secondary | ICD-10-CM | POA: Insufficient documentation

## 2020-10-03 DIAGNOSIS — F40240 Claustrophobia: Secondary | ICD-10-CM | POA: Insufficient documentation

## 2020-10-07 ENCOUNTER — Other Ambulatory Visit: Payer: Self-pay

## 2020-10-07 ENCOUNTER — Encounter: Payer: Self-pay | Admitting: Cardiology

## 2020-10-07 ENCOUNTER — Ambulatory Visit: Payer: Medicare Other | Admitting: Cardiology

## 2020-10-07 VITALS — BP 144/72 | HR 62 | Ht 69.0 in | Wt 182.1 lb

## 2020-10-07 DIAGNOSIS — R001 Bradycardia, unspecified: Secondary | ICD-10-CM | POA: Diagnosis not present

## 2020-10-07 DIAGNOSIS — I25119 Atherosclerotic heart disease of native coronary artery with unspecified angina pectoris: Secondary | ICD-10-CM | POA: Diagnosis not present

## 2020-10-07 DIAGNOSIS — I340 Nonrheumatic mitral (valve) insufficiency: Secondary | ICD-10-CM

## 2020-10-07 DIAGNOSIS — I10 Essential (primary) hypertension: Secondary | ICD-10-CM | POA: Diagnosis not present

## 2020-10-07 NOTE — Patient Instructions (Addendum)
Medication Instructions:  Your physician recommends that you continue on your current medications as directed. Please refer to the Current Medication list given to you today.  *If you need a refill on your cardiac medications before your next appointment, please call your pharmacy*   Lab Work: Your physician recommends that you return for lab work today: bmp ,cbc  If you have labs (blood work) drawn today and your tests are completely normal, you will receive your results only by:  MyChart Message (if you have MyChart) OR  A paper copy in the mail If you have any lab test that is abnormal or we need to change your treatment, we will call you to review the results.   Testing/Procedures:  You are scheduled for a TEE on 10/11/20  with Dr. Harrington Challenger.  Please arrive at the [facility] (Main Entrance A) at [facility]: [address] [place], [place] [address] at 10:30 am. (1 hour prior to procedure unless lab work is needed; if lab work is needed arrive 1.5 hours ahead)  DIET: Nothing to eat or drink after midnight except a sip of water with medications (see medication instructions below)  Medication Instructions:  Take medications as normal with 1 sip of water.     You must have a responsible person to drive you home and stay in the waiting area during your procedure. Failure to do so could result in cancellation.  Bring your insurance cards.  *Special Note: Every effort is made to have your procedure done on time. Occasionally there are emergencies that occur at the hospital that may cause delays. Please be patient if a delay does occur.     Follow-Up: At [facility], you and your health needs are our priority.  As part of our continuing mission to provide you with exceptional heart care, we have created designated Provider Care Teams.  These Care Teams include your primary Cardiologist (physician) and Advanced Practice Providers (APPs -  Physician Assistants and Nurse  Practitioners) who all work together to provide you with the care you need, when you need it.  We recommend signing up for the patient portal called "MyChart".  Sign up information is provided on this After Visit Summary.  MyChart is used to connect with patients for Virtual Visits (Telemedicine).  Patients are able to view lab/test results, encounter notes, upcoming appointments, etc.  Non-urgent messages can be sent to your provider as well.   To learn more about what you can do with MyChart, go to [email or web address].    Your next appointment:   1 month(s)  The format for your next appointment:   In Person  Provider:   Jenne Campus, MD   Other Instructions   Transesophageal Echocardiogram Transesophageal echocardiogram (TEE) is a test that uses sound waves to take pictures of your heart. TEE is done by passing a flexible tube down the esophagus. The esophagus is the tube that carries food from the throat to the stomach. The pictures give detailed images of your heart. This can help your doctor see if there are problems with your heart. What happens before the procedure? Staying hydrated Follow instructions from your doctor about hydration, which may include:  Up to 3 hours before the procedure - you may continue to drink clear liquids, such as: ? Water. ? Clear fruit juice. ? Black coffee. ? Plain tea.  Eating and drinking Follow instructions from your doctor about eating and drinking, which may include:  8 hours before the procedure -  stop eating heavy meals or foods such as meat, fried foods, or fatty foods.  6 hours before the procedure - stop eating light meals or foods, such as toast or cereal.  6 hours before the procedure - stop drinking milk or drinks that contain milk.  3 hours before the procedure - stop drinking clear liquids. General instructions  You will need to take out any dentures or retainers.  Plan to have someone take you home from the  hospital or clinic.  If you will be going home right after the procedure, plan to have someone with you for 24 hours.  Ask your doctor about: ? Changing or stopping your normal medicines. This is important if you take diabetes medicines or blood thinners. ? Taking over-the-counter medicines, vitamins, herbs, and supplements. ? Taking medicines such as aspirin and ibuprofen. These medicines can thin your blood. Do not take these medicines unless your doctor tells you to take them. What happens during the procedure?  To lower your risk of infection, your doctors will wash or clean their hands.  An IV will be put into one of your veins.  You will be given a medicine to help you relax (sedative).  A medicine may be sprayed or gargled. This numbs the back of your throat.  Your blood pressure, heart rate, and breathing will be watched.  You may be asked to lay on your left side.  A bite block will be placed in your mouth. This keeps you from biting the tube.  The tip of the TEE probe will be placed into the back of your mouth.  You will be asked to swallow.  Your doctor will take pictures of your heart.  The probe and bite block will be taken out. The procedure may vary among doctors and hospitals. What happens after the procedure?   Your blood pressure, heart rate, breathing rate, and blood oxygen level will be watched until the medicines you were given have worn off.  When you first wake up, your throat may feel sore and numb. This will get better over time. You will not be allowed to eat or drink until the numbness has gone away.  Do not drive for 24 hours if you were given a medicine to help you relax. Summary  TEE is a test that uses sound waves to take pictures of your heart.  You will be given a medicine to help you relax.  Do not drive for 24 hours if you were given a medicine to help you relax. This information is not intended to replace advice given to you by your  health care provider. Make sure you discuss any questions you have with your health care provider. Document Revised: 08/19/2018 Document Reviewed: 03/03/2017 Elsevier Patient Education  [place].

## 2020-10-07 NOTE — Progress Notes (Signed)
Cardiology Office Note:    Date:  10/07/2020   ID:  Ryan Mendoza, DOB June 21, 1934, MRN [id number]  PCP:  Angelina Sheriff, MD  Cardiologist:  Jenne Campus, MD    Referring MD: Angelina Sheriff, MD   No chief complaint on file. I am here to talk about TEE  History of Present Illness:    Ryan Mendoza is a 86 y.o. male  with past medical history significant for coronary artery disease he did have PTCA and stenting of the LAD and circumflex artery done in 2017. Last evaluation of his coronary artery disease was done by cardiac catheterization January 2020 he was in [place] and going to hospital cardiac catheterization was done which showed no significant coronary disease. He is been struggling lately with bradycardia. We tried to withdrawn all sinus node suppressing agent as well as AV node suppressing agent in spite of that he still appears to be bradycardic. He complained of being weak tired and exhausted I am worried that his bradycardia may become symptomatic.  He was referred to EP team for consideration of antiarrhythmic therapy as well as pacemaker.  However the feeling was that his bradycardia was not sufficient enough to justify pacing.  Another issue has been raised that may be his mitral regurgitation which appears to be moderate and transthoracic echocardiogram is more that what we see.  Plans are to pursue transesophageal echocardiogram to more precisely assess degree of mitral regurgitation.  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  I think we can good job she is [place] Heart Association class II.  Denies having any syncope or passing out. The purpose of this visit was to talk about transesophageal echocardiogram.  I described procedure to him including all risk benefits as well as alternative also explained to him precisely what  is the reason for the fastest.  He is willing to proceed  Past Medical History:  Diagnosis Date  . Anxiety   . Aortic valve regurgitation   . Arthritis    hands and knees  . BPH (benign prostatic hyperplasia)   . Bradycardia 11/22/2019  . Cataract, left eye   . Claustrophobia   . Coronary artery disease   . Coronary artery disease involving native coronary artery of native heart with angina pectoris ([place]) 12/08/2018   PTCA and stenting of LAD and circumflex in 2017  Formatting of this note might be different from the original. PTCA and drug-eluting stent to LAD as well as the circumflex in June 2017  . Depression    no current meds for  . Dyspnea    DOE  . Dyspnea on exertion 12/08/2018  . Elevated cholesterol   . Essential hypertension 06/19/2019  . Gallstones   . GERD (gastroesophageal reflux disease)   . History of kidney stones   . Hyperparathyroidism, primary ([place]) 09/27/2017  . Hypertension   . Hypothyroidism   . Mitral regurgitation 12/08/2018   Mild to moderate by echo from August 2019  . Myocardial infarction ([place])    SILENT  . Obstructive sleep apnea 12/08/2018  . Primary hyperparathyroidism ([place])   . 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 Status post coronary artery stent placement 12/21/2017   Formatting of this note might be different from the  original. LAD and Cx 2017  . Tinnitus    both ears all the time    Past Surgical History:  Procedure Laterality Date  . CATARACT EXTRACTION W/PHACO Left 04/12/2018   Procedure: CATARACT EXTRACTION PHACO AND INTRAOCULAR LENS PLACEMENT (IOC);  Surgeon: Birder Robson, MD;  Location: [facility] ORS;  Service: Ophthalmology;  Laterality: Left;  [place] 00:39.5 AP% 16.7 CDE 6.62 Fluid Pack Lot # [id number]  . CORONARY ANGIOPLASTY     2017  . EYE SURGERY Right    ioc for cataract  . PARATHYROIDECTOMY Right 09/28/2017   Procedure: RIGHT INFERIOR PARATHYROIDECTOMY;  Surgeon: Armandina Gemma,  MD;  Location: [facility];  Service: General;  Laterality: Right;  . right eye  plug for tear duct surgery    . stents to heart x 2  06/2016    Current Medications: Current Meds  Medication Sig  . amLODipine (NORVASC) 10 MG tablet Take 1 tablet (10 mg total) by mouth daily.  Marland Kitchen aspirin EC 81 MG tablet Take 81 mg by mouth at bedtime.   Marland Kitchen atorvastatin (LIPITOR) 40 MG tablet TAKE 1 TABLET BY MOUTH EVERYDAY AT BEDTIME  . busPIRone (BUSPAR) 5 MG tablet Take 5 mg by mouth 3 (three) times daily.  . cloNIDine (CATAPRES) 0.1 MG tablet Take 1 tablet (0.1 mg total) by mouth 2 (two) times daily.  Marland Kitchen levothyroxine (SYNTHROID) 50 MCG tablet Take 50 mcg by mouth daily.  . nitroGLYCERIN (NITROSTAT) 0.4 MG SL tablet Place 1 tablet (0.4 mg total) under the tongue every 5 (five) minutes as needed for chest pain.  Marland Kitchen omeprazole (PRILOSEC) 40 MG capsule Take 40 mg by mouth every morning.  . pantoprazole (PROTONIX) 40 MG tablet Take 40 mg by mouth 2 (two) times daily.     Allergies:   Patient has no known allergies.   Social History   Socioeconomic History  . Marital status: Married    Spouse name: Not on file  . Number of children: Not on file  . Years of education: Not on file  . Highest education level: Not on file  Occupational History  . Not on file  Tobacco Use  . Smoking status: Former Smoker    Packs/day: 1.00    Years: 10.00    Pack years: 10.00    Types: Cigarettes  . Smokeless tobacco: Never Used  . Tobacco comment: quit 1974  Vaping Use  . Vaping Use: Never used  Substance and Sexual Activity  . Alcohol use: No  . Drug use: No  . Sexual activity: Not on file  Other Topics Concern  . Not on file  Social History Narrative  . Not on file   Social Determinants of Health   Financial Resource Strain:   . Difficulty of Paying Living Expenses: Not on file  Food Insecurity:   . Worried About Charity fundraiser in the Last Year: Not on file  . Ran Out of Food in the Last Year: Not on  file  Transportation Needs:   . Lack of Transportation (Medical): Not on file  . Lack of Transportation (Non-Medical): Not on file  Physical Activity:   . Days of Exercise per Week: Not on file  . Minutes of Exercise per Session: Not on file  Stress:   . Feeling of Stress : Not on file  Social Connections:   . Frequency of Communication with Friends and Family: Not on file  . Frequency of Social Gatherings with Friends and Family: Not on file  . Attends  Religious Services: Not on file  . Active Member of Clubs or Organizations: Not on file  . Attends Archivist Meetings: Not on file  . Marital Status: Not on file     Family History: The patient's family history includes Heart attack in his father and mother; Heart disease in his father and mother. ROS:   Please see the history of present illness.    All 14 point review of systems negative except as described per history of present illness  EKGs/Labs/Other Studies Reviewed:      Recent Labs: 11/08/2019: BUN 23; Creatinine, Ser 1.64; Hemoglobin 15.2; NT-Pro BNP 534; Platelets 183; Potassium 4.9; Sodium 141 08/06/2020: TSH 7.300  Recent Lipid Panel No results found for: CHOL, TRIG, HDL, CHOLHDL, VLDL, LDLCALC, LDLDIRECT  Physical Exam:    VS:  BP (!) 144/72   Pulse 62   Ht 5\' 9"  (1.753 m)   Wt 182 lb 1.9 oz (82.6 kg)   SpO2 93%   BMI 26.89 kg/m     Wt Readings from Last 3 Encounters:  10/07/20 182 lb 1.9 oz (82.6 kg)  09/19/20 191 lb 12.8 oz (87 kg)  08/08/20 183 lb (83 kg)     GEN:  Well nourished, well developed in no acute distress HEENT: Normal NECK: No JVD; No carotid bruits LYMPHATICS: No lymphadenopathy CARDIAC: RRR, holosystolic murmur grade 2/6 to 3/6 best heard left border of the sternum without radiation, no rubs, no gallops RESPIRATORY:  Clear to auscultation without rales, wheezing or rhonchi  ABDOMEN: Soft, non-tender, non-distended MUSCULOSKELETAL:  No edema; No deformity  SKIN: Warm and  dry LOWER EXTREMITIES: no swelling NEUROLOGIC:  Alert and oriented x 3 PSYCHIATRIC:  Normal affect   ASSESSMENT:    1. Bradycardia   2. Nonrheumatic mitral valve regurgitation   3. Primary hypertension   4. Coronary artery disease involving native coronary artery of native heart with angina pectoris ([place])    PLAN:    In order of problems listed above:  1. Bradycardia not critical plan as outlined above will rule out mitral regurgitation as potential source of his fatigue and tiredness this may require TEE to more precisely determine degree of mitral regurgitation. 2. Essential hypertension blood pressure well controlled today.  Continue present management. 3. Coronary disease: Stable denies have any chest pain tightness squeezing pressure burning chest. 4. PT.  Procedure explained including all risk benefits as well as alternative.  He is willing to proceed   Medication Adjustments/Labs and Tests Ordered: Current medicines are reviewed at length with the patient today.  Concerns regarding medicines are outlined above.  No orders of the defined types were placed in this encounter.  Medication changes: No orders of the defined types were placed in this encounter.   Signed, Park Liter, MD, [facility] 10/07/2020 3:20 PM    [place]

## 2020-10-07 NOTE — H&P (View-Only) (Signed)
Cardiology Office Note:    Date:  10/07/2020   ID:  Ryan Mendoza, DOB October 18, 1934, MRN [id number]  PCP:  Angelina Sheriff, MD  Cardiologist:  Jenne Campus, MD    Referring MD: Angelina Sheriff, MD   No chief complaint on file. I am here to talk about TEE  History of Present Illness:    Ryan Mendoza is a 86 y.o. male  with past medical history significant for coronary artery disease he did have PTCA and stenting of the LAD and circumflex artery done in 2017. Last evaluation of his coronary artery disease was done by cardiac catheterization January 2020 he was in [place] and going to hospital cardiac catheterization was done which showed no significant coronary disease. He is been struggling lately with bradycardia. We tried to withdrawn all sinus node suppressing agent as well as AV node suppressing agent in spite of that he still appears to be bradycardic. He complained of being weak tired and exhausted I am worried that his bradycardia may become symptomatic.  He was referred to EP team for consideration of antiarrhythmic therapy as well as pacemaker.  However the feeling was that his bradycardia was not sufficient enough to justify pacing.  Another issue has been raised that may be his mitral regurgitation which appears to be moderate and transthoracic echocardiogram is more that what we see.  Plans are to pursue transesophageal echocardiogram to more precisely assess degree of mitral regurgitation.  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  I think we can good job she is [place] Heart Association class II.  Denies having any syncope or passing out. The purpose of this visit was to talk about transesophageal echocardiogram.  I described procedure to him including all risk benefits as well as alternative also explained to him precisely what  is the reason for the fastest.  He is willing to proceed  Past Medical History:  Diagnosis Date  . Anxiety   . Aortic valve regurgitation   . Arthritis    hands and knees  . BPH (benign prostatic hyperplasia)   . Bradycardia 11/22/2019  . Cataract, left eye   . Claustrophobia   . Coronary artery disease   . Coronary artery disease involving native coronary artery of native heart with angina pectoris ([place]) 12/08/2018   PTCA and stenting of LAD and circumflex in 2017  Formatting of this note might be different from the original. PTCA and drug-eluting stent to LAD as well as the circumflex in June 2017  . Depression    no current meds for  . Dyspnea    DOE  . Dyspnea on exertion 12/08/2018  . Elevated cholesterol   . Essential hypertension 06/19/2019  . Gallstones   . GERD (gastroesophageal reflux disease)   . History of kidney stones   . Hyperparathyroidism, primary ([place]) 09/27/2017  . Hypertension   . Hypothyroidism   . Mitral regurgitation 12/08/2018   Mild to moderate by echo from August 2019  . Myocardial infarction ([place])    SILENT  . Obstructive sleep apnea 12/08/2018  . Primary hyperparathyroidism ([place])   . 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 Status post coronary artery stent placement 12/21/2017   Formatting of this note might be different from the  original. LAD and Cx 2017  . Tinnitus    both ears all the time    Past Surgical History:  Procedure Laterality Date  . CATARACT EXTRACTION W/PHACO Left 04/12/2018   Procedure: CATARACT EXTRACTION PHACO AND INTRAOCULAR LENS PLACEMENT (IOC);  Surgeon: Birder Robson, MD;  Location: [facility] ORS;  Service: Ophthalmology;  Laterality: Left;  [place] 00:39.5 AP% 16.7 CDE 6.62 Fluid Pack Lot # [id number]  . CORONARY ANGIOPLASTY     2017  . EYE SURGERY Right    ioc for cataract  . PARATHYROIDECTOMY Right 09/28/2017   Procedure: RIGHT INFERIOR PARATHYROIDECTOMY;  Surgeon: Armandina Gemma,  MD;  Location: [facility]S;  Service: General;  Laterality: Right;  . right eye  plug for tear duct surgery    . stents to heart x 2  06/2016    Current Medications: Current Meds  Medication Sig  . amLODipine (NORVASC) 10 MG tablet Take 1 tablet (10 mg total) by mouth daily.  Marland Kitchen aspirin EC 81 MG tablet Take 81 mg by mouth at bedtime.   Marland Kitchen atorvastatin (LIPITOR) 40 MG tablet TAKE 1 TABLET BY MOUTH EVERYDAY AT BEDTIME  . busPIRone (BUSPAR) 5 MG tablet Take 5 mg by mouth 3 (three) times daily.  . cloNIDine (CATAPRES) 0.1 MG tablet Take 1 tablet (0.1 mg total) by mouth 2 (two) times daily.  Marland Kitchen levothyroxine (SYNTHROID) 50 MCG tablet Take 50 mcg by mouth daily.  . nitroGLYCERIN (NITROSTAT) 0.4 MG SL tablet Place 1 tablet (0.4 mg total) under the tongue every 5 (five) minutes as needed for chest pain.  Marland Kitchen omeprazole (PRILOSEC) 40 MG capsule Take 40 mg by mouth every morning.  . pantoprazole (PROTONIX) 40 MG tablet Take 40 mg by mouth 2 (two) times daily.     Allergies:   Patient has no known allergies.   Social History   Socioeconomic History  . Marital status: Married    Spouse name: Not on file  . Number of children: Not on file  . Years of education: Not on file  . Highest education level: Not on file  Occupational History  . Not on file  Tobacco Use  . Smoking status: Former Smoker    Packs/day: 1.00    Years: 10.00    Pack years: 10.00    Types: Cigarettes  . Smokeless tobacco: Never Used  . Tobacco comment: quit 1974  Vaping Use  . Vaping Use: Never used  Substance and Sexual Activity  . Alcohol use: No  . Drug use: No  . Sexual activity: Not on file  Other Topics Concern  . Not on file  Social History Narrative  . Not on file   Social Determinants of Health   Financial Resource Strain:   . Difficulty of Paying Living Expenses: Not on file  Food Insecurity:   . Worried About Charity fundraiser in the Last Year: Not on file  . Ran Out of Food in the Last Year: Not on  file  Transportation Needs:   . Lack of Transportation (Medical): Not on file  . Lack of Transportation (Non-Medical): Not on file  Physical Activity:   . Days of Exercise per Week: Not on file  . Minutes of Exercise per Session: Not on file  Stress:   . Feeling of Stress : Not on file  Social Connections:   . Frequency of Communication with Friends and Family: Not on file  . Frequency of Social Gatherings with Friends and Family: Not on file  . Attends  Religious Services: Not on file  . Active Member of Clubs or Organizations: Not on file  . Attends Archivist Meetings: Not on file  . Marital Status: Not on file     Family History: The patient's family history includes Heart attack in his father and mother; Heart disease in his father and mother. ROS:   Please see the history of present illness.    All 14 point review of systems negative except as described per history of present illness  EKGs/Labs/Other Studies Reviewed:      Recent Labs: 11/08/2019: BUN 23; Creatinine, Ser 1.64; Hemoglobin 15.2; NT-Pro BNP 534; Platelets 183; Potassium 4.9; Sodium 141 08/06/2020: TSH 7.300  Recent Lipid Panel No results found for: CHOL, TRIG, HDL, CHOLHDL, VLDL, LDLCALC, LDLDIRECT  Physical Exam:    VS:  BP (!) 144/72   Pulse 62   Ht 5\' 9"  (1.753 m)   Wt 182 lb 1.9 oz (82.6 kg)   SpO2 93%   BMI 26.89 kg/m     Wt Readings from Last 3 Encounters:  10/07/20 182 lb 1.9 oz (82.6 kg)  09/19/20 191 lb 12.8 oz (87 kg)  08/08/20 183 lb (83 kg)     GEN:  Well nourished, well developed in no acute distress HEENT: Normal NECK: No JVD; No carotid bruits LYMPHATICS: No lymphadenopathy CARDIAC: RRR, holosystolic murmur grade 2/6 to 3/6 best heard left border of the sternum without radiation, no rubs, no gallops RESPIRATORY:  Clear to auscultation without rales, wheezing or rhonchi  ABDOMEN: Soft, non-tender, non-distended MUSCULOSKELETAL:  No edema; No deformity  SKIN: Warm and  dry LOWER EXTREMITIES: no swelling NEUROLOGIC:  Alert and oriented x 3 PSYCHIATRIC:  Normal affect   ASSESSMENT:    1. Bradycardia   2. Nonrheumatic mitral valve regurgitation   3. Primary hypertension   4. Coronary artery disease involving native coronary artery of native heart with angina pectoris ([place])    PLAN:    In order of problems listed above:  1. Bradycardia not critical plan as outlined above will rule out mitral regurgitation as potential source of his fatigue and tiredness this may require TEE to more precisely determine degree of mitral regurgitation. 2. Essential hypertension blood pressure well controlled today.  Continue present management. 3. Coronary disease: Stable denies have any chest pain tightness squeezing pressure burning chest. 4. PT.  Procedure explained including all risk benefits as well as alternative.  He is willing to proceed   Medication Adjustments/Labs and Tests Ordered: Current medicines are reviewed at length with the patient today.  Concerns regarding medicines are outlined above.  No orders of the defined types were placed in this encounter.  Medication changes: No orders of the defined types were placed in this encounter.   Signed, Park Liter, MD, [facility] 10/07/2020 3:20 PM    [place]

## 2020-10-08 ENCOUNTER — Other Ambulatory Visit (HOSPITAL_COMMUNITY)
Admission: RE | Admit: 2020-10-08 | Discharge: 2020-10-08 | Disposition: A | Discharge status: Home / Self Care | Payer: Medicare Other | Source: Ambulatory Visit | Attending: Internal Medicine | Admitting: Internal Medicine

## 2020-10-08 DIAGNOSIS — Z01812 Encounter for preprocedural laboratory examination: Secondary | ICD-10-CM | POA: Diagnosis not present

## 2020-10-08 DIAGNOSIS — Z20822 Contact with and (suspected) exposure to covid-19: Secondary | ICD-10-CM | POA: Insufficient documentation

## 2020-10-08 LAB — CBC
Hematocrit: 46.9 % (ref 37.5–51.0)
Hemoglobin: 15.5 g/dL (ref 13.0–17.7)
MCH: 29.1 pg (ref 26.6–33.0)
MCHC: 33 g/dL (ref 31.5–35.7)
MCV: 88 fL (ref 79–97)
Platelets: 200 10*3/uL (ref 150–450)
RBC: 5.33 x10E6/uL (ref 4.14–5.80)
RDW: 13.2 % (ref 11.6–15.4)
WBC: 8.5 10*3/uL (ref 3.4–10.8)

## 2020-10-08 LAB — BASIC METABOLIC PANEL
BUN/Creatinine Ratio: 16 (ref 10–24)
BUN: 28 mg/dL — ABNORMAL HIGH (ref 8–27)
CO2: 24 mmol/L (ref 20–29)
Calcium: 9.3 mg/dL (ref 8.6–10.2)
Chloride: 106 mmol/L (ref 96–106)
Creatinine, Ser: 1.74 mg/dL — ABNORMAL HIGH (ref 0.76–1.27)
GFR calc Af Amer: 41 mL/min/{1.73_m2} — ABNORMAL LOW (ref >59)
GFR calc non Af Amer: 35 mL/min/{1.73_m2} — ABNORMAL LOW (ref >59)
Glucose: 100 mg/dL — ABNORMAL HIGH (ref 65–99)
Potassium: 5 mmol/L (ref 3.5–5.2)
Sodium: 144 mmol/L (ref 134–144)

## 2020-10-08 LAB — SARS CORONAVIRUS 2 (TAT 6-24 HRS): SARS Coronavirus 2: NEGATIVE

## 2020-10-10 ENCOUNTER — Ambulatory Visit: Payer: Medicare Other | Admitting: Cardiology

## 2020-10-11 ENCOUNTER — Ambulatory Visit (HOSPITAL_BASED_OUTPATIENT_CLINIC_OR_DEPARTMENT_OTHER)
Admission: RE | Admit: 2020-10-11 | Discharge: 2020-10-11 | Disposition: A | Discharge status: Home / Self Care | Payer: Medicare Other | Source: Home / Self Care | Attending: Internal Medicine | Admitting: Internal Medicine

## 2020-10-11 ENCOUNTER — Encounter (HOSPITAL_COMMUNITY): Payer: Self-pay | Admitting: Internal Medicine

## 2020-10-11 ENCOUNTER — Encounter (HOSPITAL_COMMUNITY)
Admission: RE | Disposition: A | Discharge status: Home / Self Care | Payer: Self-pay | Source: Home / Self Care | Attending: Internal Medicine

## 2020-10-11 ENCOUNTER — Other Ambulatory Visit: Payer: Self-pay

## 2020-10-11 ENCOUNTER — Ambulatory Visit (HOSPITAL_COMMUNITY): Payer: Medicare Other | Admitting: Anesthesiology

## 2020-10-11 ENCOUNTER — Ambulatory Visit (HOSPITAL_COMMUNITY)
Admission: RE | Admit: 2020-10-11 | Discharge: 2020-10-11 | Disposition: A | Discharge status: Home / Self Care | Payer: Medicare Other | Attending: Internal Medicine | Admitting: Internal Medicine

## 2020-10-11 DIAGNOSIS — I10 Essential (primary) hypertension: Secondary | ICD-10-CM | POA: Insufficient documentation

## 2020-10-11 DIAGNOSIS — I351 Nonrheumatic aortic (valve) insufficiency: Secondary | ICD-10-CM

## 2020-10-11 DIAGNOSIS — I083 Combined rheumatic disorders of mitral, aortic and tricuspid valves: Secondary | ICD-10-CM | POA: Diagnosis not present

## 2020-10-11 DIAGNOSIS — E7800 Pure hypercholesterolemia, unspecified: Secondary | ICD-10-CM | POA: Diagnosis not present

## 2020-10-11 DIAGNOSIS — Z87891 Personal history of nicotine dependence: Secondary | ICD-10-CM | POA: Diagnosis not present

## 2020-10-11 DIAGNOSIS — I340 Nonrheumatic mitral (valve) insufficiency: Secondary | ICD-10-CM | POA: Diagnosis not present

## 2020-10-11 DIAGNOSIS — R001 Bradycardia, unspecified: Secondary | ICD-10-CM | POA: Diagnosis not present

## 2020-10-11 DIAGNOSIS — I25119 Atherosclerotic heart disease of native coronary artery with unspecified angina pectoris: Secondary | ICD-10-CM | POA: Diagnosis not present

## 2020-10-11 DIAGNOSIS — I080 Rheumatic disorders of both mitral and aortic valves: Secondary | ICD-10-CM | POA: Insufficient documentation

## 2020-10-11 DIAGNOSIS — E039 Hypothyroidism, unspecified: Secondary | ICD-10-CM | POA: Diagnosis not present

## 2020-10-11 HISTORY — PX: TEE WITHOUT CARDIOVERSION: SHX5443

## 2020-10-11 HISTORY — PX: BUBBLE STUDY: SHX6837

## 2020-10-11 SURGERY — ECHOCARDIOGRAM, TRANSESOPHAGEAL
Anesthesia: Monitor Anesthesia Care

## 2020-10-11 MED ORDER — LIDOCAINE 2% (20 MG/ML) 5 ML SYRINGE
INTRAMUSCULAR | Status: DC | PRN
  Administered 2020-10-11: 60 mg via INTRAVENOUS

## 2020-10-11 MED ORDER — PROPOFOL 10 MG/ML IV BOLUS
INTRAVENOUS | Status: DC | PRN
  Administered 2020-10-11 (×3): 20 mg via INTRAVENOUS

## 2020-10-11 MED ORDER — LIDOCAINE VISCOUS HCL 2 % MT SOLN
OROMUCOSAL | Status: DC | PRN
  Administered 2020-10-11: 10 mL via OROMUCOSAL

## 2020-10-11 MED ORDER — PROPOFOL 500 MG/50ML IV EMUL
INTRAVENOUS | Status: DC | PRN
  Administered 2020-10-11: 125 ug/kg/min via INTRAVENOUS

## 2020-10-11 MED ORDER — LIDOCAINE VISCOUS HCL 2 % MT SOLN
OROMUCOSAL | Status: AC
  Filled 2020-10-11: qty 15

## 2020-10-11 MED ORDER — SODIUM CHLORIDE 0.9 % IV SOLN: INTRAVENOUS | Status: DC

## 2020-10-11 NOTE — Discharge Instructions (Signed)

## 2020-10-11 NOTE — Interval H&P Note (Signed)
History and Physical Interval Note:  10/11/2020 11:00 AM  Ryan Mendoza  has presented today for surgery, with the diagnosis of [place].  The various methods of treatment have been discussed with the patient and family. After consideration of risks, benefits and other options for treatment, the patient has consented to  Procedure(s): TRANSESOPHAGEAL ECHOCARDIOGRAM (TEE) (N/A) as a surgical intervention.  The patient's history has been reviewed, patient examined, no change in status, stable for surgery.  I have reviewed the patient's chart and labs.  Questions were answered to the patient's satisfaction.     Dorris Carnes

## 2020-10-11 NOTE — Transfer of Care (Signed)
Immediate Anesthesia Transfer of Care Note  Patient: BRADSHAW MINIHAN  Procedure(s) Performed: TRANSESOPHAGEAL ECHOCARDIOGRAM (TEE) (N/A ) BUBBLE STUDY  Patient Location: Endoscopy Unit  Anesthesia Type:MAC  Level of Consciousness: drowsy and patient cooperative  Airway & Oxygen Therapy: Patient Spontanous Breathing and Patient connected to nasal cannula oxygen  Post-op Assessment: Report given to RN and Post -op Vital signs reviewed and stable  Post vital signs: Reviewed and stable  Last Vitals:  Vitals Value Taken Time  BP    Temp    Pulse 42 10/11/20 1214  Resp 11 10/11/20 1214  SpO2 98 % 10/11/20 1214  Vitals shown include unvalidated device data.  Last Pain:  Vitals:   10/11/20 1116  TempSrc: Oral  PainSc: 0-No pain         Complications: No complications documented.

## 2020-10-11 NOTE — CV Procedure (Signed)
TEE  Pt sedated by anesthesia Throad numbed with viscous lidocaine Bite block inserted TEE probe advanced to mid esophagus without difficulty  Full report to follow in CV section of chart  Procedure was without complication  Dorris Carnes MD

## 2020-10-11 NOTE — Anesthesia Preprocedure Evaluation (Addendum)
Anesthesia Evaluation  Patient identified by MRN, date of birth, ID band Patient awake    Reviewed: Allergy & Precautions, H&P , NPO status , Patient's Chart, lab work & pertinent test results  Airway Mallampati: II  TM Distance: >3 FB Neck ROM: Full    Dental  (+) Dental Advisory Given, Chipped   Pulmonary shortness of breath, sleep apnea , former smoker,    Pulmonary exam normal breath sounds clear to auscultation       Cardiovascular hypertension, Pt. on medications + angina + CAD and + Past MI   Rhythm:Regular Rate:Normal + Systolic murmurs Echo 04/4981 1. Left ventricular ejection fraction, by estimation, is 55 to 60%. The left ventricle has normal function. The left ventricle has no regional wall motion abnormalities. The left ventricular internal cavity size was mildly dilated. There is mild concentric left ventricular hypertrophy. Left ventricular diastolic parameters are consistent with Grade II diastolic dysfunction (pseudonormalization).  2. Right ventricular systolic function is normal. The right ventricular size is normal. There is mildly elevated pulmonary artery systolic pressure.  3. Left atrial size was severely dilated.  4. Right atrial size was mildly dilated.  5. The mitral valve is normal in structure. Moderate mitral valve regurgitation,with eccentric jet going posterior laterally. No evidence of mitral stenosis.  6. The aortic valve is tricuspid, thickened and mildly calcified. Aortic valve regurgitation is mild to moderate. The aortic regurgitation jet is directed towards the anterior mitral leaflet. No aortic stenosis is present.  7. Aneurysm of the ascending aorta, measuring 40 mm.  8. The inferior vena cava is normal in size with greater than 50% respiratory variability, suggesting right atrial pressure of 3 mmHg.    Neuro/Psych PSYCHIATRIC DISORDERS Anxiety Depression    GI/Hepatic GERD  Medicated  and Controlled,  Endo/Other  Hypothyroidism   Renal/GU      Musculoskeletal  (+) Arthritis ,   Abdominal   Peds  Hematology   Anesthesia Other Findings   Reproductive/Obstetrics                            Anesthesia Physical  Anesthesia Plan  ASA: III  Anesthesia Plan: MAC   Post-op Pain Management:    Induction:   PONV Risk Score and Plan: 1 and TIVA, Treatment may vary due to age or medical condition and Propofol infusion  Airway Management Planned: Natural Airway  Additional Equipment: None  Intra-op Plan:   Post-operative Plan:   Informed Consent: I have reviewed the patients History and Physical, chart, labs and discussed the procedure including the risks, benefits and alternatives for the proposed anesthesia with the patient or authorized representative who has indicated his/her understanding and acceptance.     Dental advisory given  Plan Discussed with: CRNA  Anesthesia Plan Comments:        Anesthesia Quick Evaluation

## 2020-10-11 NOTE — Progress Notes (Signed)
  Echocardiogram Echocardiogram Transesophageal has been performed.  Ryan Mendoza M 10/11/2020, 1:28 PM

## 2020-10-11 NOTE — Anesthesia Postprocedure Evaluation (Signed)
Anesthesia Post Note  Patient: Ryan Mendoza  Procedure(s) Performed: TRANSESOPHAGEAL ECHOCARDIOGRAM (TEE) (N/A ) BUBBLE STUDY     Patient location during evaluation: PACU Anesthesia Type: MAC Level of consciousness: awake and alert Pain management: pain level controlled Vital Signs Assessment: post-procedure vital signs reviewed and stable Respiratory status: spontaneous breathing Cardiovascular status: stable Anesthetic complications: no   No complications documented.  Last Vitals:  Vitals:   10/11/20 1232 10/11/20 1242  BP: (!) 136/49 (!) 147/57  Pulse: (!) 44 (!) 46  Resp: 15 14  Temp:    SpO2: 94% 96%    Last Pain:  Vitals:   10/11/20 1242  TempSrc:   PainSc: 0-No pain                 Nolon Nations

## 2020-10-12 DIAGNOSIS — I129 Hypertensive chronic kidney disease with stage 1 through stage 4 chronic kidney disease, or unspecified chronic kidney disease: Secondary | ICD-10-CM | POA: Diagnosis not present

## 2020-10-12 DIAGNOSIS — K219 Gastro-esophageal reflux disease without esophagitis: Secondary | ICD-10-CM | POA: Diagnosis not present

## 2020-10-12 DIAGNOSIS — E039 Hypothyroidism, unspecified: Secondary | ICD-10-CM | POA: Diagnosis not present

## 2020-10-12 DIAGNOSIS — N189 Chronic kidney disease, unspecified: Secondary | ICD-10-CM | POA: Diagnosis not present

## 2020-10-13 ENCOUNTER — Encounter (HOSPITAL_COMMUNITY): Payer: Self-pay | Admitting: Internal Medicine

## 2020-10-13 LAB — ECHO TEE
MV M vel: 4.15 m/s
MV Peak grad: 68.8 mmHg
P 1/2 time: 578 msec
Radius: 0.55 cm

## 2020-11-12 ENCOUNTER — Other Ambulatory Visit: Payer: Self-pay

## 2020-11-12 ENCOUNTER — Ambulatory Visit: Payer: Medicare Other | Admitting: Cardiology

## 2020-11-12 ENCOUNTER — Encounter: Payer: Self-pay | Admitting: Cardiology

## 2020-11-12 VITALS — BP 170/80 | HR 74 | Ht 69.0 in | Wt 188.0 lb

## 2020-11-12 DIAGNOSIS — I340 Nonrheumatic mitral (valve) insufficiency: Secondary | ICD-10-CM | POA: Diagnosis not present

## 2020-11-12 DIAGNOSIS — I10 Essential (primary) hypertension: Secondary | ICD-10-CM

## 2020-11-12 DIAGNOSIS — R0600 Dyspnea, unspecified: Secondary | ICD-10-CM | POA: Diagnosis not present

## 2020-11-12 DIAGNOSIS — I25119 Atherosclerotic heart disease of native coronary artery with unspecified angina pectoris: Secondary | ICD-10-CM | POA: Diagnosis not present

## 2020-11-12 DIAGNOSIS — R001 Bradycardia, unspecified: Secondary | ICD-10-CM

## 2020-11-12 DIAGNOSIS — Z955 Presence of coronary angioplasty implant and graft: Secondary | ICD-10-CM

## 2020-11-12 DIAGNOSIS — R0609 Other forms of dyspnea: Secondary | ICD-10-CM

## 2020-11-12 NOTE — Addendum Note (Signed)
Addended by: Senaida Ores on: 11/12/2020 03:17 PM   Modules accepted: Orders

## 2020-11-12 NOTE — Progress Notes (Signed)
Cardiology Office Note:    Date:  11/12/2020   ID:  Ryan Mendoza, DOB Oct 19, 1934, MRN [id number]  PCP:  Angelina Sheriff, MD  Cardiologist:  Jenne Campus, MD    Referring MD: Angelina Sheriff, MD   Chief Complaint  Patient presents with  . Follow-up  I am here to discuss my TEE  History of Present Illness:    Ryan Mendoza is a 86 y.o. male with past medical history significant for coronary artery disease, PTCA and stenting of the LAD and circumflex artery done in 2017, last evaluation of his coronary artery disease was done by cardiac catheterization January 2020 when he was in [place] and up going to the hospital because of atypical chest pain, cardiac catheterization showed non obstructive lesions.  He has been complaining of being weak tired and exhausted and suspicion was that it was related to significant sinus bradycardia.  He is all sinus node blocking agent has been withdrawn however in spite of that he still feeling weak tired exhausted as well as some short of breath.  He eventually end up being referred to EP team for consideration of pacemaker however I have feeling was that bradycardia was not significant enough to justify his symptoms.  Recommendation was to pursue transesophageal echocardiogram to look more carefully at the degree of mitral regurgitation.  Eventually he ended up having transesophageal cardiogram done which showed severe mitral regurgitation with flail leaflet.  He is here to talk about that.  He complained of being weak tired exhausted does have some swelling of lower extremities.  There is some exertional shortness of breath.  Denies have any palpitations, no passing out.  He is New York Heart Association class II/III  Past Medical History:  Diagnosis Date  . Anxiety   . Aortic valve regurgitation   . Arthritis    hands and knees  . BPH (benign prostatic hyperplasia)   . Bradycardia 11/22/2019  . Cataract, left eye   . Claustrophobia   .  Coronary artery disease   . Coronary artery disease involving native coronary artery of native heart with angina pectoris ([place]) 12/08/2018   PTCA and stenting of LAD and circumflex in 2017  Formatting of this note might be different from the original. PTCA and drug-eluting stent to LAD as well as the circumflex in June 2017  . Depression    no current meds for  . Dyspnea    DOE  . Dyspnea on exertion 12/08/2018  . Elevated cholesterol   . Essential hypertension 06/19/2019  . Gallstones   . GERD (gastroesophageal reflux disease)   . History of kidney stones   . Hyperparathyroidism, primary ([place]) 09/27/2017  . Hypertension   . Hypothyroidism   . Mitral regurgitation 12/08/2018   Mild to moderate by echo from August 2019  . Myocardial infarction ([place])    SILENT  . Obstructive sleep apnea 12/08/2018  . Primary hyperparathyroidism ([place])   . 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 Status post coronary artery stent placement 12/21/2017   Formatting of this note might be different from the original. LAD and Cx 2017  . Tinnitus    both ears all the time    Past Surgical History:  Procedure Laterality Date  . BUBBLE STUDY  10/11/2020   Procedure: BUBBLE STUDY;  Surgeon: Fay Records, MD;  Location: [facility];  Service: Cardiovascular;;  . CATARACT EXTRACTION W/PHACO Left  04/12/2018   Procedure: CATARACT EXTRACTION PHACO AND INTRAOCULAR LENS PLACEMENT (IOC);  Surgeon: Birder Robson, MD;  Location: [facility] ORS;  Service: Ophthalmology;  Laterality: Left;  [place] 00:39.5 AP% 16.7 CDE 6.62 Fluid Pack Lot # [id number]  . CORONARY ANGIOPLASTY     2017  . EYE SURGERY Right    ioc for cataract  . PARATHYROIDECTOMY Right 09/28/2017   Procedure: RIGHT INFERIOR PARATHYROIDECTOMY;  Surgeon: Armandina Gemma, MD;  Location: [facility];  Service: General;  Laterality: Right;  . right eye  plug for tear duct surgery    . stents to heart x 2  06/2016  . TEE WITHOUT  CARDIOVERSION N/A 10/11/2020   Procedure: TRANSESOPHAGEAL ECHOCARDIOGRAM (TEE);  Surgeon: Fay Records, MD;  Location: [facility]COPY;  Service: Cardiovascular;  Laterality: N/A;    Current Medications: Current Meds  Medication Sig  . amLODipine (NORVASC) 10 MG tablet Take 1 tablet (10 mg total) by mouth daily.  Marland Kitchen aspirin EC 81 MG tablet Take 81 mg by mouth at bedtime.   Marland Kitchen atorvastatin (LIPITOR) 40 MG tablet TAKE 1 TABLET BY MOUTH EVERYDAY AT BEDTIME  . busPIRone (BUSPAR) 5 MG tablet Take 5 mg by mouth 3 (three) times daily.  . cloNIDine (CATAPRES) 0.1 MG tablet Take 1 tablet (0.1 mg total) by mouth 2 (two) times daily.  Marland Kitchen levothyroxine (SYNTHROID) 50 MCG tablet Take 50 mcg by mouth daily before breakfast.   . nitroGLYCERIN (NITROSTAT) 0.4 MG SL tablet Place 1 tablet (0.4 mg total) under the tongue every 5 (five) minutes as needed for chest pain.  Marland Kitchen omeprazole (PRILOSEC) 40 MG capsule Take 40 mg by mouth every morning.     Allergies:   Patient has no known allergies.   Social History   Socioeconomic History  . Marital status: Married    Spouse name: Not on file  . Number of children: Not on file  . Years of education: Not on file  . Highest education level: Not on file  Occupational History  . Not on file  Tobacco Use  . Smoking status: Former Smoker    Packs/day: 1.00    Years: 10.00    Pack years: 10.00    Types: Cigarettes  . Smokeless tobacco: Never Used  . Tobacco comment: quit 1974  Vaping Use  . Vaping Use: Never used  Substance and Sexual Activity  . Alcohol use: No  . Drug use: No  . Sexual activity: Not on file  Other Topics Concern  . Not on file  Social History Narrative  . Not on file   Social Determinants of Health   Financial Resource Strain:   . Difficulty of Paying Living Expenses: Not on file  Food Insecurity:   . Worried About Charity fundraiser in the Last Year: Not on file  . Ran Out of Food in the Last Year: Not on file  Transportation  Needs:   . Lack of Transportation (Medical): Not on file  . Lack of Transportation (Non-Medical): Not on file  Physical Activity:   . Days of Exercise per Week: Not on file  . Minutes of Exercise per Session: Not on file  Stress:   . Feeling of Stress : Not on file  Social Connections:   . Frequency of Communication with Friends and Family: Not on file  . Frequency of Social Gatherings with Friends and Family: Not on file  . Attends Religious Services: Not on file  . Active Member of Clubs or Organizations: Not on file  .  Attends Archivist Meetings: Not on file  . Marital Status: Not on file     Family History: The patient's family history includes Heart attack in his father and mother; Heart disease in his father and mother. ROS:   Please see the history of present illness.    All 14 point review of systems negative except as described per history of present illness  EKGs/Labs/Other Studies Reviewed:      Recent Labs: 08/06/2020: TSH 7.300 10/07/2020: BUN 28; Creatinine, Ser 1.74; Hemoglobin 15.5; Platelets 200; Potassium 5.0; Sodium 144  Recent Lipid Panel No results found for: CHOL, TRIG, HDL, CHOLHDL, VLDL, LDLCALC, LDLDIRECT  Physical Exam:    VS:  BP (!) 170/80 (BP Location: Right Arm, Patient Position: Sitting, Cuff Size: Normal)   Pulse 74   Ht 5\' 9"  (1.753 m)   Wt 188 lb (85.3 kg)   SpO2 97%   BMI 27.76 kg/m     Wt Readings from Last 3 Encounters:  11/12/20 188 lb (85.3 kg)  10/11/20 180 lb 12.4 oz (82 kg)  10/07/20 182 lb 1.9 oz (82.6 kg)     GEN:  Well nourished, well developed in no acute distress HEENT: Normal NECK: No JVD; No carotid bruits LYMPHATICS: No lymphadenopathy CARDIAC: RRR, holosystolic murmur grade 3/6 best heard in the apex, no rubs, no gallops RESPIRATORY:  Clear to auscultation without rales, wheezing or rhonchi  ABDOMEN: Soft, non-tender, non-distended MUSCULOSKELETAL:  No edema; No deformity  SKIN: Warm and dry LOWER  EXTREMITIES: no swelling NEUROLOGIC:  Alert and oriented x 3 PSYCHIATRIC:  Normal affect   ASSESSMENT:    1. Nonrheumatic mitral valve regurgitation   2. Essential hypertension   3. Coronary artery disease involving native coronary artery of native heart with angina pectoris ([place])   4. Status post coronary artery stent placement   5. Dyspnea on exertion   6. Bradycardia    PLAN:    In order of problems listed above:  1. Nonrheumatic mitral valve regurgitation appears to be severe based on last transesophageal echocardiogram, he will be scheduled to see cardiothoracic surgery for consultation for possible mitral valve repair.  I suspect he will also require cardiac catheterization before that.  Last cardiac catheterization done in January 2020 showed no critical lesions. 2. I will also ask him to have Chem-7 done to see if I will be able to add some Entresto to his medical regimen since his left ventricle ejection fraction was 5055%.  However, his kidney function checked a month ago showed creatinine 1.74 and GFR of 35. 3. Coronary artery disease.  Again stable from that point review but may require evaluation before potential valve surgery. 4. Sinus bradycardia not critical.  We will continue monitoring. 5. Dyspnea on exertion: Plan as outlined above.   Medication Adjustments/Labs and Tests Ordered: Current medicines are reviewed at length with the patient today.  Concerns regarding medicines are outlined above.  No orders of the defined types were placed in this encounter.  Medication changes: No orders of the defined types were placed in this encounter.   Signed, Park Liter, MD, [facility] 11/12/2020 3:09 PM    [place]

## 2020-11-12 NOTE — Patient Instructions (Signed)

## 2020-11-13 ENCOUNTER — Telehealth: Payer: Self-pay | Admitting: Cardiology

## 2020-11-13 NOTE — Telephone Encounter (Signed)
Ryan Mendoza, Daughter of the patient called. The patient needs to have a copy of the CD of the Heart Cath that was done in 2019 in [place] to take to his appt 12/02/20.  The daughter wanted to know if Dr. Agustin Cree had a copy of the disc. The daughter asked if our office does not have one if we could reach out to the Doctor that did the surgery. Please let the Daughter know if we have a copy of the disc.

## 2020-11-13 NOTE — Telephone Encounter (Signed)
Called patient daughter informed her we do not have a CD. Advised her to reach out to hospital it was done at and get with medical records. She verbally understood no further questions.

## 2020-11-19 DIAGNOSIS — G4700 Insomnia, unspecified: Secondary | ICD-10-CM | POA: Diagnosis not present

## 2020-11-19 DIAGNOSIS — Z23 Encounter for immunization: Secondary | ICD-10-CM | POA: Diagnosis not present

## 2020-11-25 ENCOUNTER — Other Ambulatory Visit: Payer: Self-pay | Admitting: Cardiology

## 2020-12-02 ENCOUNTER — Encounter: Payer: Medicare Other | Admitting: Thoracic Surgery (Cardiothoracic Vascular Surgery)

## 2020-12-09 ENCOUNTER — Other Ambulatory Visit: Payer: Self-pay

## 2020-12-09 ENCOUNTER — Institutional Professional Consult (permissible substitution): Payer: Medicare Other | Admitting: Thoracic Surgery (Cardiothoracic Vascular Surgery)

## 2020-12-09 ENCOUNTER — Encounter: Payer: Self-pay | Admitting: Thoracic Surgery (Cardiothoracic Vascular Surgery)

## 2020-12-09 VITALS — BP 170/73 | HR 67 | Temp 97.8°F | Resp 20 | Ht 69.0 in | Wt 182.0 lb

## 2020-12-09 DIAGNOSIS — I340 Nonrheumatic mitral (valve) insufficiency: Secondary | ICD-10-CM | POA: Diagnosis not present

## 2020-12-09 DIAGNOSIS — I351 Nonrheumatic aortic (valve) insufficiency: Secondary | ICD-10-CM

## 2020-12-09 MED ORDER — POTASSIUM CHLORIDE ER 10 MEQ PO TBCR: 10.0000 meq | EXTENDED_RELEASE_TABLET | Freq: Every day | ORAL | 1 refills | Status: DC

## 2020-12-09 MED ORDER — FUROSEMIDE 20 MG PO TABS: 20.0000 mg | ORAL_TABLET | Freq: Every day | ORAL | 1 refills | Status: DC

## 2020-12-09 NOTE — Patient Instructions (Addendum)
Check your weight on a regular basis and keep a log for your records.  Look for signs of fluid overload such as worsening swelling of your lower legs, increased shortness of breath with activity, and/or a dry nonproductive cough.  Discussed these findings with your cardiologist including whether or not you should adjust your fluid pill dosage (diuretic).  Try taking Lasix (furosemide) to see if a fluid pill improves your congestion.  Take potassium with lasix.  Continue all other previous medications without any changes at this time

## 2020-12-09 NOTE — Progress Notes (Signed)
[facility]  CARDIOTHORACIC SURGERY CONSULTATION REPORT  Referring Provider is Park Liter, MD  PCP is Angelina Sheriff, MD  Chief Complaint  Patient presents with  . Mitral Regurgitation    Surgical consult, TEE 10/11/20, Cardiac Cth 01/06/19@ [facility] in [place] [address]    HPI:  Patient is an 86 year old male with history of coronary artery disease status post PCI and stenting of the LAD and left circumflex in 2017, hypertension, hyperlipidemia, hypothyroidism, bradycardia, and chronic anxiety who has been referred for surgical consultation to discuss treatment options for management of mitral regurgitation.  Patient's cardiac history dates back to 2017 when he reportedly presented with symptoms of chest pain and shortness of breath.  He was treated at the time at [facility] where he apparently underwent PCI and stenting of the left anterior descending coronary artery and the left circumflex coronary artery.  Over the past 2 years he has had worsening and progressive symptoms of exertional shortness of breath, decreased energy, fatigue, tightness across his chest, and increasing anxiety.  In January 2020 he developed particularly severe episode of substernal chest discomfort for which she was briefly hospitalized in [facility].  On review of the records from that admission he apparently was thought to be in atrial fibrillation on presentation but spontaneously converted to sinus rhythm.  He ruled out for acute myocardial infarction.  Catheterization performed at that time revealed nonobstructive coronary artery disease and the patient was told that his chest pain was likely related to ulcer disease.  Over the past 2 years he has complained of worsening fatigue and exhaustion.  He has had episodes of increased exertional shortness of breath with occasional resting shortness of breath.   Transthoracic echocardiogram performed March 2021 revealed normal left ventricular systolic function with severely dilated left atrium, moderate mitral regurgitation and mild to moderate aortic regurgitation.  He was evaluated by Dr. Caryl Comes for concerns of possible symptomatic bradycardia, but findings have not been suggestive of symptomatic bradycardia nor chronotropic incompetence.  He was seen in follow-up recently by Dr. Agustin Cree and transesophageal echocardiogram was recommended to further evaluate severity of mitral regurgitation.  TEE performed October 11, 2020 revealed what was felt to be severe mitral regurgitation with partially flail A2 and P2 segments of both the anterior and posterior leaflets and multiple eccentric jets of regurgitation.  There was felt to be moderate aortic insufficiency.  Left ventricular function was reportedly "low normal" with ejection fraction estimated 50 to 55%.  Cardiothoracic surgical consultation was requested.  Patient is married and lives with his elderly spouse who has significant dementia.  He is her primary caregiver.  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  He remains functionally independent and reasonably active for a gentleman his age although he admits that he has been slowing down considerably over the past few years.  He suffers from chronic anxiety which is likely at least partially related to stressors related to caring for his wife.  He complains of nearly 3-year history of persistent symptoms of exertional shortness of breath with occasional resting shortness of breath, orthopnea, and tightness across his chest.  He has occasional lower extremity edema.  Chest discomfort is usually improved by sublingual nitroglycerin.  He has had occasional palpitations.  His mobility is reasonably good although he does have problems with arthritis in both knees and lower back.  He is accompanied  by one of his daughters for his office  consultation visit today.  Past Medical History:  Diagnosis Date  . Anxiety   . Aortic valve regurgitation   . Arthritis    hands and knees  . BPH (benign prostatic hyperplasia)   . Bradycardia 11/22/2019  . Cataract, left eye   . Claustrophobia   . Coronary artery disease   . Coronary artery disease involving native coronary artery of native heart with angina pectoris ([place]) 12/08/2018   PTCA and stenting of LAD and circumflex in 2017  Formatting of this note might be different from the original. PTCA and drug-eluting stent to LAD as well as the circumflex in June 2017  . Depression    no current meds for  . Dyspnea    DOE  . Dyspnea on exertion 12/08/2018  . Elevated cholesterol   . Essential hypertension 06/19/2019  . Gallstones   . GERD (gastroesophageal reflux disease)   . History of kidney stones   . Hyperparathyroidism, primary ([place]) 09/27/2017  . Hypertension   . Hypothyroidism   . Mitral regurgitation 12/08/2018   Mild to moderate by echo from August 2019  . Myocardial infarction ([place])    SILENT  . Obstructive sleep apnea 12/08/2018  . Primary hyperparathyroidism ([place])   . 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 Status post coronary artery stent placement 12/21/2017   Formatting of this note might be different from the original. LAD and Cx 2017  . Tinnitus    both ears all the time    Past Surgical History:  Procedure Laterality Date  . BUBBLE STUDY  10/11/2020   Procedure: BUBBLE STUDY;  Surgeon: Fay Records, MD;  Location: [place];  Service: Cardiovascular;;  . CATARACT EXTRACTION W/PHACO Left 04/12/2018   Procedure: CATARACT EXTRACTION PHACO AND INTRAOCULAR LENS PLACEMENT ([place]);  Surgeon: Birder Robson, MD;  Location: [facility];  Service: Ophthalmology;  Laterality: Left;  [place] 00:39.5 AP% 16.7 CDE 6.62 Fluid Pack Lot # [id number]  . CORONARY ANGIOPLASTY     2017  . EYE SURGERY Right    ioc for cataract  .  PARATHYROIDECTOMY Right 09/28/2017   Procedure: RIGHT INFERIOR PARATHYROIDECTOMY;  Surgeon: Armandina Gemma, MD;  Location: [facility];  Service: General;  Laterality: Right;  . right eye  plug for tear duct surgery    . stents to heart x 2  06/2016  . TEE WITHOUT CARDIOVERSION N/A 10/11/2020   Procedure: TRANSESOPHAGEAL ECHOCARDIOGRAM (TEE);  Surgeon: Fay Records, MD;  Location: [facility];  Service: Cardiovascular;  Laterality: N/A;    Family History  Problem Relation Age of Onset  . Heart disease Mother   . Heart attack Mother   . Heart disease Father   . Heart attack Father     Social History   Socioeconomic History  . Marital status: Married    Spouse name: Not on file  . Number of children: Not on file  . Years of education: Not on file  . Highest education level: Not on file  Occupational History  . Not on file  Tobacco Use  . Smoking status: Former Smoker    Packs/day: 1.00    Years: 10.00    Pack years: 10.00    Types: Cigarettes  . Smokeless tobacco: Never Used  . Tobacco comment: quit 1974  Vaping Use  . Vaping Use: Never used  Substance and Sexual Activity  . Alcohol use: No  .  Drug use: No  . Sexual activity: Not on file  Other Topics Concern  . Not on file  Social History Narrative  . Not on file   Social Determinants of Health   Financial Resource Strain: Not on file  Food Insecurity: Not on file  Transportation Needs: Not on file  Physical Activity: Not on file  Stress: Not on file  Social Connections: Not on file  Intimate Partner Violence: Not on file    Current Outpatient Medications  Medication Sig Dispense Refill  . amLODipine (NORVASC) 10 MG tablet Take 1 tablet (10 mg total) by mouth daily. 90 tablet 1  . aspirin EC 81 MG tablet Take 81 mg by mouth at bedtime.     Marland Kitchen atorvastatin (LIPITOR) 40 MG tablet TAKE 1 TABLET BY MOUTH EVERYDAY AT BEDTIME 90 tablet 3  . busPIRone (BUSPAR) 5 MG tablet Take 5 mg by mouth 3 (three) times daily.    .  cloNIDine (CATAPRES) 0.1 MG tablet Take 1 tablet (0.1 mg total) by mouth 2 (two) times daily. 180 tablet 3  . levothyroxine (SYNTHROID) 50 MCG tablet Take 50 mcg by mouth daily before breakfast.     . nitroGLYCERIN (NITROSTAT) 0.4 MG SL tablet Place 1 tablet (0.4 mg total) under the tongue every 5 (five) minutes as needed for chest pain. 25 tablet 3  . omeprazole (PRILOSEC) 40 MG capsule Take 40 mg by mouth every morning.     No current facility-administered medications for this visit.    No Known Allergies    Review of Systems:   General:  normal appetite, decreased energy, no weight gain, no weight loss, no fever  Cardiac:  + chest pain with exertion, + chest pain at rest, +SOB with exertion, + resting SOB, + PND, + orthopnea, + palpitations, + arrhythmia, ? atrial fibrillation, intermittent LE edema, no dizzy spells, no syncope  Respiratory:  + chronic shortness of breath, no home oxygen, no productive cough, no dry cough, no bronchitis, no wheezing, no hemoptysis, no asthma, no pain with inspiration or cough, no sleep apnea, no CPAP at night  GI:   + difficulty swallowing, + reflux, no frequent heartburn, no hiatal hernia, no abdominal pain, + constipation, no diarrhea, no hematochezia, no hematemesis, no melena  GU:   no dysuria,  no frequency, no urinary tract infection, no hematuria, no enlarged prostate, no kidney stones, no kidney disease  Vascular:  no pain suggestive of claudication, no pain in feet, no leg cramps, no varicose veins, no DVT, no non-healing foot ulcer  Neuro:   no stroke, no TIA's, no seizures, no headaches, no temporary blindness one eye,  no slurred speech, no peripheral neuropathy, no chronic pain, no instability of gait, no memory/cognitive dysfunction  Musculoskeletal: + arthritis, + joint swelling, no myalgias, no difficulty walking, normal mobility   Skin:   no rash, + itching, no skin infections, no pressure sores or ulcerations  Psych:   + anxiety, +  depression, no nervousness, no unusual recent stress  Eyes:   + blurry vision, no floaters, no recent vision changes, + wears glasses or contacts  ENT:   + hearing loss, no loose or painful teeth, no dentures, last saw dentist 6 months ago  Hematologic:  no easy bruising, no abnormal bleeding, no clotting disorder, no frequent epistaxis  Endocrine:  no diabetes, does not check CBG's at home           Physical Exam:   BP (!) 170/73   Pulse  67   Temp 97.8 F (36.6 C) (Skin)   Resp 20   Ht 5\' 9"  (1.753 m)   Wt 182 lb (82.6 kg)   SpO2 96% Comment: RA  BMI 26.88 kg/m   General:  Elderly male NAD  HEENT:  Unremarkable   Neck:   no JVD, no bruits, no adenopathy   Chest:   clear to auscultation, symmetrical breath sounds, no wheezes, no rhonchi   CV:   RRR, grade III/VI blowing holosystolic murmur heard best at LUSB and apex,  no diastolic murmur  Abdomen:  soft, non-tender, no masses   Extremities:  warm, well-perfused, pulses palpable, no LE edema  Rectal/GU  Deferred  Neuro:   Grossly non-focal and symmetrical throughout  Skin:   Clean and dry, no rashes, no breakdown   Diagnostic Tests:  CARDIAC CATHETERIZATION  Both report and images from diagnostic cardiac catheterization performed January 06, 2019 at [facility] revealed moderate nonobstructive coronary artery disease with patency of previous stents in the left anterior descending coronary artery     ECHOCARDIOGRAM REPORT       Patient Name:  Ryan Mendoza Date of Exam: 03/01/2020  Medical Rec #: 03/03/2020   Height:    69.0 in  Accession #:  [id number]  Weight:    181.1 lb  Date of Birth: 09-27-1934  BSA:     1.981 m  Patient Age:  85 years   BP:      128/62 mmHg  Patient Gender: M       HR:      48 bpm.  Exam Location: [place]   Procedure: 2D Echo   Indications:  CAD Native Vessel 414.01 / I25.10    History:    Patient has prior history of  Echocardiogram examinations,  most         recent 01/26/2019. Mitral regurgitation, Arrythmias:PAC and         Bradycardia; Risk Factors:Hypertension.    Sonographer:  01/28/2019  Referring Phys: [phone] ROBERT J KRASOWSKI   IMPRESSIONS    1. Left ventricular ejection fraction, by estimation, is 55 to 60%. The  left ventricle has normal function. The left ventricle has no regional  wall motion abnormalities. The left ventricular internal cavity size was  mildly dilated. There is mild  concentric left ventricular hypertrophy. Left ventricular diastolic  parameters are consistent with Grade II diastolic dysfunction  (pseudonormalization).  2. Right ventricular systolic function is normal. The right ventricular  size is normal. There is mildly elevated pulmonary artery systolic  pressure.  3. Left atrial size was severely dilated.  4. Right atrial size was mildly dilated.  5. The mitral valve is normal in structure. Moderate mitral valve  regurgitation,with eccentric jet going posterior laterally. No evidence of  mitral stenosis.  6. The aortic valve is tricuspid, thickened and mildly calcified. Aortic  valve regurgitation is mild to moderate. The aortic regurgitation jet is  directed towards the anterior mitral leaflet. No aortic stenosis is  present.  7. Aneurysm of the ascending aorta, measuring 40 mm.  8. The inferior vena cava is normal in size with greater than 50%  respiratory variability, suggesting right atrial pressure of 3 mmHg.   FINDINGS  Left Ventricle: Left ventricular ejection fraction, by estimation, is 55  to 60%. The left ventricle has normal function. The left ventricle has no  regional wall motion abnormalities. The left ventricular internal cavity  size was mildly dilated. There is  mild concentric left ventricular  hypertrophy. Left ventricular diastolic  parameters are consistent with Grade II diastolic dysfunction   (pseudonormalization).   Right Ventricle: The right ventricular size is normal. No increase in  right ventricular wall thickness. Right ventricular systolic function is  normal. There is mildly elevated pulmonary artery systolic pressure. The  tricuspid regurgitant velocity is 2.86  m/s, and with an assumed right atrial pressure of 3 mmHg, the estimated  right ventricular systolic pressure is 123XX123 mmHg.   Left Atrium: Left atrial size was severely dilated.   Right Atrium: Right atrial size was mildly dilated.   Pericardium: There is no evidence of pericardial effusion.   Mitral Valve: The mitral valve is normal in structure. Normal mobility of  the mitral valve leaflets. Moderate mitral valve regurgitation, with  eccentric posteriorly directed jet. No evidence of mitral valve stenosis.   Tricuspid Valve: The tricuspid valve is normal in structure. Tricuspid  valve regurgitation is mild . No evidence of tricuspid stenosis.   Aortic Valve: The aortic regurgitation jet is directed towards the  anterior mitral leaflet. The aortic valve is tricuspid. . There is mild  thickening and mild calcification of the aortic valve. Aortic valve  regurgitation is mild to moderate. Aortic  regurgitation PHT measures 641 msec. No aortic stenosis is present. Mild  aortic valve annular calcification. There is mild thickening of the aortic  valve. There is mild calcification of the aortic valve.   Pulmonic Valve: The pulmonic valve was normal in structure. Pulmonic valve  regurgitation is mild. No evidence of pulmonic stenosis.   Aorta: Aortic dilatation noted. There is an aneurysm involving the  ascending aorta. The aneurysm measures 40 mm.   Venous: The inferior vena cava is normal in size with greater than 50%  respiratory variability, suggesting right atrial pressure of 3 mmHg.   IAS/Shunts: No atrial level shunt detected by color flow Doppler.     LEFT VENTRICLE  PLAX 2D  LVIDd:      5.90 cm   Diastology  LVIDs:     3.90 cm   LV e' lateral:  6.74 cm/s  LV PW:     1.00 cm   LV E/e' lateral: 16.6  LV IVS:    1.10 cm   LV e' medial:  6.42 cm/s  LVOT diam:   2.40 cm   LV E/e' medial: 17.4  LV SV:     147  LV SV Index:  74      2D Longitudinal Strain  LVOT Area:   4.52 cm   2D Strain GLS Avg:   -13.7 %    LV Volumes (MOD)  LV vol d, MOD A2C: 104.8 ml  LV vol d, MOD A4C: 108.0 ml  LV vol s, MOD A2C: 47.4 ml  LV vol s, MOD A4C: 49.2 ml  LV SV MOD A2C:   57.4 ml  LV SV MOD A4C:   108.0 ml  LV SV MOD BP:   58.6 ml   RIGHT VENTRICLE     IVC  TAPSE (M-mode): 2.6 cm IVC diam: 1.80 cm   LEFT ATRIUM       Index    RIGHT ATRIUM      Index  LA diam:    5.00 cm 2.52 cm/m RA Area:   23.60 cm  LA Vol (A2C):  126.0 ml 63.61 ml/m RA Volume:  74.40 ml 37.56 ml/m  LA Vol (A4C):  90.5 ml 45.69 ml/m  LA Biplane Vol: 108.0 ml 54.53 ml/m  AORTIC VALVE  LVOT Vmax:  98.40 cm/s  LVOT Vmean: 59.900 cm/s  LVOT VTI:  0.324 m  AI PHT:   641 msec    AORTA  Ao Root diam: 3.10 cm  Ao Asc diam: 4.00 cm   MITRAL VALVE         TRICUSPID VALVE  MV Area (PHT): 3.06 cm   TR Peak grad:  32.7 mmHg  MV Decel Time: 248 msec   TR Vmax:    286.00 cm/s  MR Peak grad:  85.0 mmHg  MR Mean grad:  56.0 mmHg  SHUNTS  MR Vmax:     461.00 cm/s Systemic VTI: 0.32 m  MR Vmean:    353.0 cm/s Systemic Diam: 2.40 cm  MR PISA:     6.28 cm  MR PISA Eff ROA: 46 mm  MR PISA Radius: 1.00 cm  MV E velocity: 112.00 cm/s  MV A velocity: 104.00 cm/s  MV E/A ratio: 1.08   Kardie Tobb DO  Electronically signed by Berniece Salines DO  Signature Date/Time: 03/01/2020/5:01:42 PM       TRANSESOPHOGEAL ECHO REPORT       Patient Name:  Ryan Mendoza Date of Exam: 10/11/2020  Medical Rec #: [id number]   Height:    69.0 in  Accession #:  [id number]  Weight:     180.8 lb  Date of Birth: 11/14/1934  BSA:     1.979 m  Patient Age:  77 years   BP:      97/48 mmHg  Patient Gender: M       HR:      46 bpm.  Exam Location: Inpatient   Procedure: Transesophageal Echo, Color Doppler, Cardiac Doppler, 3D Echo  and       Saline Contrast Bubble Study   Indications:   Mitral valve regurgitation [[id number]]    History:     Patient has prior history of Echocardiogram examinations,  most          recent 03/01/2020. CAD, Arrythmias:PAC and Bradycardia;  Risk          Factors:Hypertension.    Sonographer:   Darlina Sicilian RDCS  Referring Phys: [id number] PAULA V ROSS  Diagnosing Phys: Dorris Carnes MD     Sonographer Comments:  AoV regurgitant volume (PV flow)= 35 mL  AoV ERO= 13cm2  AoV regurgitant fraction = 61.4%   MV regurgitant fraction (PV flow)= 82.6%  MV ERO (PV Flow)= 1.02 cm2  MV regurgitant volume (PV flow)= 138 mL    PROCEDURE: After discussion of the risks and benefits of a TEE, an  informed consent was obtained from the patient. The transesophogeal probe  was passed without difficulty through the esophogus of the patient. Imaged  were obtained with the patient in a  left lateral decubitus position. Local oropharyngeal anesthetic was  provided with viscous lidocaine. Sedation performed by different  physician. The patient was monitored while under deep sedation.  Anesthestetic sedation was provided intravenously by  Anesthesiology: 388mg  of Propofol, 60mg  of Lidocaine. The patient's vital  signs; including heart rate, blood pressure, and oxygen saturation;  remained stable throughout the procedure. The patient developed no  complications during the procedure.   IMPRESSIONS    1. Left ventricular ejection fraction, by estimation, is 50 to 55%. The  left ventricle has low normal function.  2. Right ventricular systolic function is low normal. The right  ventricular  size is normal.  3. No left atrial/left atrial appendage thrombus was detected.  4. The mitral valve  is abnormal. There is partially flail A2 and P2  segments (Carpent[place] Type II) with eccentric jets directed anterior and  posterior into LA. Regurgitation is severe, quantification difficult with  eccentricity and multiple jets.  5. Aortic insufficiency is eccentric, directed more posteriorly into LV .  The aortic valve is tricuspid. Aortic valve regurgitation is moderate.  Mild aortic valve sclerosis is present, with no evidence of aortic valve  stenosis.  6. MIld fixed plaqing in the thoracic aorta.  7. Agitated saline contrast bubble study was negative, with no evidence  of any interatrial shunt.   FINDINGS  Left Ventricle: Left ventricular ejection fraction, by estimation, is 50  to 55%. The left ventricle has low normal function. The left ventricular  internal cavity size was normal in size. The ratio of pulmonic flow to  systemic flow (Qp/Qs ratio) is 0.40.   Right Ventricle: The right ventricular size is normal. Right vetricular  wall thickness was not assessed. Right ventricular systolic function is  low normal.   Left Atrium: Left atrial size was normal in size. No left atrial/left  atrial appendage thrombus was detected.   Right Atrium: Right atrial size was normal in size.   Pericardium: There is no evidence of pericardial effusion.   Mitral Valve: The mitral valve is abnormal. .There is partially flail A2  and P2 segments (Carpentier Type II) with eccentric jets of MR directed  anterior and posterior into LA Regurgitation is severe, quatification is  difficult with eccentricity and  multiple jets.   Tricuspid Valve: The tricuspid valve is normal in structure. Tricuspid  valve regurgitation is mild.   Aortic Valve: Aortic insufficiency is eccentric, directed more posteriorly  into LV. The aortic valve is tricuspid. Aortic valve regurgitation is  moderate.  Aortic regurgitation PHT measures 578 msec. Mild aortic valve  sclerosis is present, with no  evidence of aortic valve stenosis.   Pulmonic Valve: The pulmonic valve was normal in structure. Pulmonic valve  regurgitation is mild to moderate.   Aorta: MIld fixed plaqing in the thoracic aorta. The aortic root is normal  in size and structure.   IAS/Shunts: No atrial level shunt detected by color flow Doppler. Agitated  saline contrast was given intravenously to evaluate for intracardiac  shunting. Agitated saline contrast bubble study was negative, with no  evidence of any interatrial shunt. The  ratio of pulmonic flow to systemic flow (Qp/Qs ratio) is 0.40.     LEFT VENTRICLE  PLAX 2D  LVOT diam:   2.30 cm  LV SV:     60  LV SV Index:  30  LVOT Area:   4.15 cm     RIGHT VENTRICLE  RVOT diam:   2.90 cm   AORTIC VALVE       PULMONIC VALVE  LVOT Vmax:  59.90 cm/s RVOT Peak grad: 0 mmHg  LVOT Vmean: 42.500 cm/s  LVOT VTI:  0.145 m  AI PHT:   578 msec   MITRAL VALVE  MV Peak grad: 0.6 mmHg    SHUNTS  MV Mean grad: 0.0 mmHg    Systemic VTI: 0.14 m  MV Vmax:   0.39 m/s    Systemic Diam: 2.30 cm  MV Vmean:   15.0 cm/s   Pulmonic VTI: 0.034 m  MR Peak grad:  68.8 mmHg  Pulmonic Diam: 2.90 cm  MR Mean grad:  38.5 mmHg  Qp/Qs:     0.37  MR Vmax:     414.75 cm/s  MR Vmean:  281.2 cm/s  MR PISA:     1.90 cm  MR PISA Eff ROA: 17 mm  MR PISA Radius: 0.55 cm   Dorris Carnes MD  Electronically signed by Dorris Carnes MD  Signature Date/Time: 10/13/2020/11:57:38 PM    Impression:  Patient has known coronary artery disease and multi-valvular disease with possibly severe symptomatic primary mitral regurgitation and at least moderate aortic insufficiency.  He has 2 to 3-year history of chronic and progressive symptoms of exertional shortness of breath with intermittent resting shortness of breath, PND, and orthopnea  consistent with chronic diastolic congestive heart failure, New York Heart Association functional class III.  The patient also has exacerbation of chest tightness which seems to correlate with exacerbation of shortness of breath but is also frequently relieved by administration of sublingual nitroglycerin, suggestive of possible angina pectoris.  Symptoms are further complicated by the presence of chronic anxiety and stress.  I have personally reviewed the patient's recent transesophageal echocardiogram.  There is degenerative disease of both the aortic and mitral valves.  There appears to be small flail segment of the middle scallop of the posterior leaflet with possible prolapse involving the A2 segment of the anterior leaflet.  The severity of mitral regurgitation may be severe with multiple eccentric jets, although quantification could not be easily performed.  It is unclear whether or not there was flow reversal in the pulmonary veins.  There is also at least moderate aortic insufficiency with an eccentric jet which blows directly on the ventricular surface of the anterior leaflet of the mitral valve.  Left ventricular systolic function may be mildly reduced.  I agree that it is possible the patient would benefit from mitral valve repair.  However, I would be reluctant to consider conventional surgical intervention in this elderly patient with multiple medical problems and chronic anxiety, particularly given the fact that he is the primary caregiver for his elderly wife.  Moreover, I would not consider surgical intervention without addressing the aortic insufficiency at the time of surgery.  It is unclear whether or not the patient might be candidate for transcatheter edge-to-edge repair of his mitral valve.  Despite the fact that the diagnostic cardiac catheterization performed 2 years ago did not reveal significant obstructive disease, I would not recommend any further intervention without repeat  diagnostic cardiac catheterization.   Plan:  The patient and his daughter were counseled at length regarding treatment alternatives for management of severe symptomatic mitral regurgitation.  Alternative approaches such as conventional surgical mitral valve repair or replacement, percutaneous edge-to-edge Mitraclip repair, and continued medical therapy without intervention were compared and contrasted at length.  The significance of the patient's aortic insufficiency was discussed and we directly reviewed images from the patient's recent transesophageal echocardiogram.  The risks associated with conventional surgery were discussed in detail, as were expectations for post-operative convalescence, and why I would be reluctant to consider this patient a candidate for conventional surgery.  Issues specific to Mitraclip repair were discussed including questions about long term freedom from persistent or recurrent mitral regurgitation, the potential for device migration or embolization, and other technical complications related to the procedure itself.   Long-term prognosis with medical therapy was discussed. This discussion was placed in the context of the patient's own specific clinical presentation and past medical history.    As a next step I have given the patient a prescription for low-dose furosemide with supplemental potassium to see if his symptoms of shortness of breath and orthopnea improve on diuretic therapy.  The patient's recent transesophageal echocardiogram will be reviewed by a multidisciplinary team of heart valve specialists to discuss whether or not transcatheter edge-to-edge repair of the mitral valve could be considered.  This may or may not require repeat transesophageal echocardiogram, and once this has been completed we will discuss whether or not to recommend proceeding with diagnostic cardiac catheterization.  The patient will be contacted via telephone early next month with our  recommendations and for now we will plan that he will return to our office for follow-up in approximately 4 weeks.  All of their questions have been addressed.    I spent in excess of 90 minutes during the conduct of this office consultation and >50% of this time involved direct face-to-face encounter with the patient for counseling and/or coordination of their care.       Valentina Gu. Roxy Manns, MD 12/09/2020 3:20 PM

## 2020-12-10 DIAGNOSIS — L821 Other seborrheic keratosis: Secondary | ICD-10-CM | POA: Diagnosis not present

## 2020-12-10 DIAGNOSIS — C44319 Basal cell carcinoma of skin of other parts of face: Secondary | ICD-10-CM | POA: Diagnosis not present

## 2020-12-10 DIAGNOSIS — L578 Other skin changes due to chronic exposure to nonionizing radiation: Secondary | ICD-10-CM | POA: Diagnosis not present

## 2020-12-16 NOTE — Progress Notes (Signed)
Ryan Mendoza DOB 2034/08/15  This looks like a clippable valve. The fossa looks approachable for transseptal puncture in the SAXB and Bicaval views. LA dimensions are large enough for device steering and straddle. The defect appears to be on the medial/central part of the valve. The posterior leaflet measures around 1.51 cm in the 135 LVOT grasping view. MVA measures around 5.82 cm2. I would like to verify gradient in the case prior to start.  Based on this information, I'd recommend starting with an NTW and assessing for gradient.

## 2020-12-18 DIAGNOSIS — K219 Gastro-esophageal reflux disease without esophagitis: Secondary | ICD-10-CM | POA: Diagnosis not present

## 2020-12-19 ENCOUNTER — Telehealth: Payer: Self-pay

## 2020-12-19 NOTE — Telephone Encounter (Signed)
  [facility]   MULTIDISCIPLINARY HEART VALVE TEAM   Pt's case was reviewed on 12/17/2020 with the Multidisciplinary Heart Valve team and the team has recommended that the pt undergo evaluation for potential MitraClip with Dr Excell Seltzer.  The team did recommend that the pt undergo cardiac catheterization and this can be arranged by Dr Excell Seltzer.  I have left the pt a message to contact me for further discussion.

## 2020-12-19 NOTE — Telephone Encounter (Signed)
The pt returned my call.  MitraClip Consult scheduled on 1/13 at 2:00 PM with Dr Excell Seltzer.

## 2020-12-20 ENCOUNTER — Ambulatory Visit: Payer: Medicare Other | Admitting: Cardiology

## 2020-12-25 DIAGNOSIS — R1310 Dysphagia, unspecified: Secondary | ICD-10-CM | POA: Diagnosis not present

## 2020-12-25 DIAGNOSIS — K224 Dyskinesia of esophagus: Secondary | ICD-10-CM | POA: Diagnosis not present

## 2020-12-26 ENCOUNTER — Other Ambulatory Visit: Payer: Self-pay

## 2020-12-26 ENCOUNTER — Ambulatory Visit: Payer: Medicare Other | Admitting: Cardiovascular Disease

## 2020-12-26 ENCOUNTER — Encounter: Payer: Self-pay | Admitting: Cardiovascular Disease

## 2020-12-26 VITALS — BP 140/60 | HR 64 | Ht 69.0 in | Wt 180.0 lb

## 2020-12-26 DIAGNOSIS — I340 Nonrheumatic mitral (valve) insufficiency: Secondary | ICD-10-CM | POA: Diagnosis not present

## 2020-12-26 NOTE — Progress Notes (Signed)
[facility]   MULTIDISCIPLINARY HEART VALVE TEAM  Date:  12/26/2020   ID:  Ryan Mendoza, DOB [birth year], MRN [id number]  PCP:  Angelina Sheriff, MD   Chief Complaint  Patient presents with  . Shortness of Breath     HISTORY OF PRESENT ILLNESS: Ryan Mendoza is a 87 y.o. male who presents for evaluation of severe mitral regurgitation, referred by Dr Agustin Cree.  The patient has a history of coronary heart disease and he has undergone stenting of the LAD and left circumflex back in 2017.  He has developed symptoms of progressive shortness of breath with exertion, decreased energy, fatigue, and chest tightness over the past few years.  Because of his progressive symptoms, he underwent TEE evaluation to better assess mitral regurgitation which had previously been graded as moderate on 2D echo images.  Transesophageal echo demonstrated severe mitral regurgitation with a partially flail A2 and P2 and multiple eccentric jets of mitral regurgitation.  The patient is here with his daughter today. 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 He maintains a fairly active lifestyle and still drives a car. He is short of breath with raking leaves and he would not be able to walk the length of a city block because of fatigue and shortness of breath. He also complains of orthopnea and mild leg swelling. The patient has a lot of problems with anxiety as well.   The patient has had regular dental care and reports the need for partial dentures, but no active problems with his teeth or gums.   Past Medical History:  Diagnosis Date  . Anxiety   . Aortic valve regurgitation   . Arthritis    hands and knees  . BPH (benign prostatic hyperplasia)   . Bradycardia 11/22/2019  . Cataract, left eye   . Claustrophobia   . Coronary artery disease   . Coronary artery disease involving native  coronary artery of native heart with angina pectoris ([place]) 12/08/2018   PTCA and stenting of LAD and circumflex in 2017  Formatting of this note might be different from the original. PTCA and drug-eluting stent to LAD as well as the circumflex in June 2017  . Depression    no current meds for  . Dyspnea    DOE  . Dyspnea on exertion 12/08/2018  . Elevated cholesterol   . Essential hypertension 06/19/2019  . Gallstones   . GERD (gastroesophageal reflux disease)   . History of kidney stones   . Hyperparathyroidism, primary ([place]) 09/27/2017  . Hypertension   . Hypothyroidism   . Mitral regurgitation 12/08/2018   Mild to moderate by echo from August 2019  . Myocardial infarction ([place])    SILENT  . Obstructive sleep apnea 12/08/2018  . Primary hyperparathyroidism ([place])   . 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 Status post coronary artery stent placement 12/21/2017   Formatting of this note might be different from the original. LAD and Cx 2017  . Tinnitus    both ears all the time    Current Outpatient Medications  Medication Sig Dispense Refill  . amLODipine (NORVASC) 10 MG tablet Take 1 tablet (10 mg total) by mouth daily. 90 tablet 1  . aspirin EC 81 MG tablet Take 81 mg by mouth at bedtime.     Marland Kitchen  atorvastatin (LIPITOR) 40 MG tablet TAKE 1 TABLET BY MOUTH EVERYDAY AT BEDTIME 90 tablet 3  . busPIRone (BUSPAR) 5 MG tablet Take 5 mg by mouth 3 (three) times daily.    . cloNIDine (CATAPRES) 0.1 MG tablet Take 1 tablet (0.1 mg total) by mouth 2 (two) times daily. 180 tablet 3  . furosemide (LASIX) 20 MG tablet Take 1 tablet (20 mg total) by mouth daily. 30 tablet 1  . levothyroxine (SYNTHROID) 50 MCG tablet Take 50 mcg by mouth daily before breakfast.     . nitroGLYCERIN (NITROSTAT) 0.4 MG SL tablet Place 1 tablet (0.4 mg total) under the tongue every 5 (five) minutes as needed for chest pain. 25 tablet 3  . omeprazole (PRILOSEC) 40 MG capsule  Take 40 mg by mouth every morning.    . potassium chloride (KLOR-CON) 10 MEQ tablet Take 1 tablet (10 mEq total) by mouth daily. 30 tablet 1   No current facility-administered medications for this visit.    ALLERGIES:   Patient has no known allergies.   SOCIAL HISTORY:  The patient  reports that he has quit smoking. His smoking use included cigarettes. He has a 10.00 pack-year smoking history. He has never used smokeless tobacco. He reports that he does not drink alcohol and does not use drugs.   FAMILY HISTORY:  The patient's family history includes Heart attack in his father and mother; Heart disease in his father and mother.   REVIEW OF SYSTEMS:  Positive for belching, dysphagia, fatigue.   All other systems are reviewed and negative.   PHYSICAL EXAM: VS:  BP 140/60 (BP Location: Left Arm, Patient Position: Sitting, Cuff Size: Normal)   Pulse 64   Ht 5\' 9"  (1.753 m)   Wt 180 lb (81.6 kg)   SpO2 96%   BMI 26.58 kg/m  , BMI Body mass index is 26.58 kg/m. GEN: Well nourished, well developed, in no acute distress HEENT: normal Neck: No JVD. carotids 2+ without bruits or masses Cardiac: The heart is RRR with a grade 2/6 holosystolic murmur at the apex.  No edema. Pedal pulses 2+ = bilaterally  Respiratory:  clear to auscultation bilaterally GI: soft, nontender, nondistended, + BS MS: no deformity or atrophy Skin: warm and dry, no rash Neuro:  Strength and sensation are intact Psych: euthymic mood, full affect  EKG:  EKG from 10/07/2020 reviewed and demonstrates normal sinus rhythm with nonspecific ST abnormality  RECENT LABS: 08/06/2020: TSH 7.300 10/07/2020: BUN 28; Creatinine, Ser 1.74; Hemoglobin 15.5; Platelets 200; Potassium 5.0; Sodium 144  No results found for requested labs within last 8760 hours.   CrCl cannot be calculated (Patient's most recent lab result is older than the maximum 21 days allowed.).   Wt Readings from Last 3 Encounters:  12/26/20 180 lb (81.6 kg)   12/09/20 182 lb (82.6 kg)  11/12/20 188 lb (85.3 kg)     CARDIAC STUDIES:  TEE: IMPRESSIONS    1. Left ventricular ejection fraction, by estimation, is 50 to 55%. The  left ventricle has low normal function.  2. Right ventricular systolic function is low normal. The right  ventricular size is normal.  3. No left atrial/left atrial appendage thrombus was detected.  4. The mitral valve is abnormal. There is partially flail A2 and P2  segments ([place]ent[place] Type II) with eccentric jets directed anterior and  posterior into LA. Regurgitation is severe, quantification difficult with  eccentricity and multiple jets.  5. Aortic insufficiency is eccentric, directed more posteriorly into  LV .  The aortic valve is tricuspid. Aortic valve regurgitation is moderate.  Mild aortic valve sclerosis is present, with no evidence of aortic valve  stenosis.  6. MIld fixed plaqing in the thoracic aorta.  7. Agitated saline contrast bubble study was negative, with no evidence  of any interatrial shunt.   STS RISK CALCULATOR: Mitral Valve Repair: Risk of Mortality: 3.828% Renal Failure: 1.978% Permanent Stroke: 3.435% Prolonged Ventilation: 7.847% DSW Infection: 0.055% Reoperation: 4.392% Morbidity or Mortality: 14.281% Short Length of Stay: 30.400% Long Length of Stay: 4.848%  Mitral Valve Replacement: Risk of Mortality: 2.941% Renal Failure: 2.113% Permanent Stroke: 1.280% Prolonged Ventilation: 12.667% DSW Infection: 0.105% Reoperation: 6.296% Morbidity or Mortality: 20.698% Short Length of Stay: 15.644% Long Length of Stay: 8.232%  ASSESSMENT AND PLAN: 1.  The patient has severe, stage D1 primary mitral regurgitation and associated New York Heart Association functional class III symptoms of chronic diastolic heart failure and exertional angina.  I have personally reviewed his TEE which demonstrates degenerative mitral valve disease with a partially flail P2  segment of the posterior mitral leaflet as well as mild anterior prolapse.  The patient has multiple jets of mitral regurgitation but qualitatively there felt to be severe when combined.  His symptoms have progressed over the last year and he currently is quite limited with functional class III symptoms.  I have reviewed the natural history of mitral regurgitation with the patient and their family members who are present today. We have discussed the limitations of medical therapy and the poor prognosis associated with symptomatic mitral regurgitation. We have also reviewed potential treatment options, including palliative medical therapy, conventional surgical mitral valve repair or replacement, and percutaneous mitral valve therapies such as edge-to-edge mitral valve approximation with MitraClip. We discussed treatment options in the context of this patient's specific comorbid medical conditions.  The patient is felt to be at high risk of conventional surgical mitral valve repair in the setting of his advanced age and chronic anxiety as well as his other comorbid medical conditions.  Transcatheter edge-to-edge mitral valve repair is a reasonable treatment alternative and I think the functional anatomy of his mitral valve would allow for transcatheter valve repair.  However, the patient has known coronary artery disease with previous stenting and should undergo right and left heart catheterization prior to elective transcatheter mitral valve edge-to-edge repair.  This will provide coronary artery assessment and hemodynamic evaluation.  I have reviewed the risks, indications, and alternatives to cardiac catheterization, possible angioplasty, and stenting with the patient. Risks include but are not limited to bleeding, infection, vascular injury, stroke, myocardial infection, arrhythmia, kidney injury, radiation-related injury in the case of prolonged fluoroscopy use, emergency cardiac surgery, and death. The  patient understands the risks of serious complication is 1-2 in 4098 with diagnostic cardiac cath and 1-2% or less with angioplasty/stenting.   The patient is also counseled specifically about the risks, indications, and alternatives to percutaneous mitral valve repair with MitraClip.  Procedural steps are discussed.  Expected recovery is reviewed.  Specific risks include vascular injury, bleeding, infection, arrhythmia, myocardial infarction, stroke, cardiac perforation, cardiac tamponade, device embolization, single leaflet detachment, endocarditis, mitral valve injury, emergency surgery, and death. They understand the risk of serious complication occurs at a rate of approximately 2%.  All of his questions are answered this afternoon.  Once his cardiac catheterization is completed, we will proceed with transcatheter edge-to-edge mitral valve repair if still indicated.  Ryan Mendoza 12/26/2020 3:08 PM  [address] [place] [address]  [phone] (office) [phone] (fax)

## 2020-12-26 NOTE — H&P (View-Only) (Signed)
[facility]   MULTIDISCIPLINARY HEART VALVE TEAM  Date:  12/26/2020   ID:  Ryan Mendoza, DOB March 18, 1934, MRN [id number]  PCP:  Ryan Sheriff, MD   Chief Complaint  Patient presents with  . Shortness of Breath     HISTORY OF PRESENT ILLNESS: Ryan Mendoza is a 87 y.o. male who presents for evaluation of severe mitral regurgitation, referred by Dr Ryan Mendoza.  The patient has a history of coronary heart disease and he has undergone stenting of the LAD and left circumflex back in 2017.  He has developed symptoms of progressive shortness of breath with exertion, decreased energy, fatigue, and chest tightness over the past few years.  Because of his progressive symptoms, he underwent TEE evaluation to better assess mitral regurgitation which had previously been graded as moderate on 2D echo images.  Transesophageal echo demonstrated severe mitral regurgitation with a partially flail A2 and P2 and multiple eccentric jets of mitral regurgitation.  The patient is here with his daughter today. 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 He maintains a fairly active lifestyle and still drives a car. He is short of breath with raking leaves and he would not be able to walk the length of a city block because of fatigue and shortness of breath. He also complains of orthopnea and mild leg swelling. The patient has a lot of problems with anxiety as well.   The patient has had regular dental care and reports the need for partial dentures, but no active problems with his teeth or gums.   Past Medical History:  Diagnosis Date  . Anxiety   . Aortic valve regurgitation   . Arthritis    hands and knees  . BPH (benign prostatic hyperplasia)   . Bradycardia 11/22/2019  . Cataract, left eye   . Claustrophobia   . Coronary artery disease   . Coronary artery disease involving native  coronary artery of native heart with angina pectoris ([place]) 12/08/2018   PTCA and stenting of LAD and circumflex in 2017  Formatting of this note might be different from the original. PTCA and drug-eluting stent to LAD as well as the circumflex in June 2017  . Depression    no current meds for  . Dyspnea    DOE  . Dyspnea on exertion 12/08/2018  . Elevated cholesterol   . Essential hypertension 06/19/2019  . Gallstones   . GERD (gastroesophageal reflux disease)   . History of kidney stones   . Hyperparathyroidism, primary ([place]) 09/27/2017  . Hypertension   . Hypothyroidism   . Mitral regurgitation 12/08/2018   Mild to moderate by echo from August 2019  . Myocardial infarction ([place])    SILENT  . Obstructive sleep apnea 12/08/2018  . Primary hyperparathyroidism ([place])   . 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 Status post coronary artery stent placement 12/21/2017   Formatting of this note might be different from the original. LAD and Cx 2017  . Tinnitus    both ears all the time    Current Outpatient Medications  Medication Sig Dispense Refill  . amLODipine (NORVASC) 10 MG tablet Take 1 tablet (10 mg total) by mouth daily. 90 tablet 1  . aspirin EC 81 MG tablet Take 81 mg by mouth at bedtime.     Marland Kitchen  atorvastatin (LIPITOR) 40 MG tablet TAKE 1 TABLET BY MOUTH EVERYDAY AT BEDTIME 90 tablet 3  . busPIRone (BUSPAR) 5 MG tablet Take 5 mg by mouth 3 (three) times daily.    . cloNIDine (CATAPRES) 0.1 MG tablet Take 1 tablet (0.1 mg total) by mouth 2 (two) times daily. 180 tablet 3  . furosemide (LASIX) 20 MG tablet Take 1 tablet (20 mg total) by mouth daily. 30 tablet 1  . levothyroxine (SYNTHROID) 50 MCG tablet Take 50 mcg by mouth daily before breakfast.     . nitroGLYCERIN (NITROSTAT) 0.4 MG SL tablet Place 1 tablet (0.4 mg total) under the tongue every 5 (five) minutes as needed for chest pain. 25 tablet 3  . omeprazole (PRILOSEC) 40 MG capsule  Take 40 mg by mouth every morning.    . potassium chloride (KLOR-CON) 10 MEQ tablet Take 1 tablet (10 mEq total) by mouth daily. 30 tablet 1   No current facility-administered medications for this visit.    ALLERGIES:   Patient has no known allergies.   SOCIAL HISTORY:  The patient  reports that he has quit smoking. His smoking use included cigarettes. He has a 10.00 pack-year smoking history. He has never used smokeless tobacco. He reports that he does not drink alcohol and does not use drugs.   FAMILY HISTORY:  The patient's family history includes Heart attack in his father and mother; Heart disease in his father and mother.   REVIEW OF SYSTEMS:  Positive for belching, dysphagia, fatigue.   All other systems are reviewed and negative.   PHYSICAL EXAM: VS:  BP 140/60 (BP Location: Left Arm, Patient Position: Sitting, Cuff Size: Normal)   Pulse 64   Ht 5\' 9"  (1.753 m)   Wt 180 lb (81.6 kg)   SpO2 96%   BMI 26.58 kg/m  , BMI Body mass index is 26.58 kg/m. GEN: Well nourished, well developed, in no acute distress HEENT: normal Neck: No JVD. carotids 2+ without bruits or masses Cardiac: The heart is RRR with a grade 2/6 holosystolic murmur at the apex.  No edema. Pedal pulses 2+ = bilaterally  Respiratory:  clear to auscultation bilaterally GI: soft, nontender, nondistended, + BS MS: no deformity or atrophy Skin: warm and dry, no rash Neuro:  Strength and sensation are intact Psych: euthymic mood, full affect  EKG:  EKG from 10/07/2020 reviewed and demonstrates normal sinus rhythm with nonspecific ST abnormality  RECENT LABS: 08/06/2020: TSH 7.300 10/07/2020: BUN 28; Creatinine, Ser 1.74; Hemoglobin 15.5; Platelets 200; Potassium 5.0; Sodium 144  No results found for requested labs within last 8760 hours.   CrCl cannot be calculated (Patient's most recent lab result is older than the maximum 21 days allowed.).   Wt Readings from Last 3 Encounters:  12/26/20 180 lb (81.6 kg)   12/09/20 182 lb (82.6 kg)  11/12/20 188 lb (85.3 kg)     CARDIAC STUDIES:  TEE: IMPRESSIONS    1. Left ventricular ejection fraction, by estimation, is 50 to 55%. The  left ventricle has low normal function.  2. Right ventricular systolic function is low normal. The right  ventricular size is normal.  3. No left atrial/left atrial appendage thrombus was detected.  4. The mitral valve is abnormal. There is partially flail A2 and P2  segments (Carpentier Type II) with eccentric jets directed anterior and  posterior into LA. Regurgitation is severe, quantification difficult with  eccentricity and multiple jets.  5. Aortic insufficiency is eccentric, directed more posteriorly into  LV .  The aortic valve is tricuspid. Aortic valve regurgitation is moderate.  Mild aortic valve sclerosis is present, with no evidence of aortic valve  stenosis.  6. MIld fixed plaqing in the thoracic aorta.  7. Agitated saline contrast bubble study was negative, with no evidence  of any interatrial shunt.   STS RISK CALCULATOR: Mitral Valve Repair: Risk of Mortality: 3.828% Renal Failure: 1.978% Permanent Stroke: 3.435% Prolonged Ventilation: 7.847% DSW Infection: 0.055% Reoperation: 4.392% Morbidity or Mortality: 14.281% Short Length of Stay: 30.400% Long Length of Stay: 4.848%  Mitral Valve Replacement: Risk of Mortality: 2.941% Renal Failure: 2.113% Permanent Stroke: 1.280% Prolonged Ventilation: 12.667% DSW Infection: 0.105% Reoperation: 6.296% Morbidity or Mortality: 20.698% Short Length of Stay: 15.644% Long Length of Stay: 8.232%  ASSESSMENT AND PLAN: 1.  The patient has severe, stage D1 primary mitral regurgitation and associated New York Heart Association functional class III symptoms of chronic diastolic heart failure and exertional angina.  I have personally reviewed his TEE which demonstrates degenerative mitral valve disease with a partially flail P2  segment of the posterior mitral leaflet as well as mild anterior prolapse.  The patient has multiple jets of mitral regurgitation but qualitatively there felt to be severe when combined.  His symptoms have progressed over the last year and he currently is quite limited with functional class III symptoms.  I have reviewed the natural history of mitral regurgitation with the patient and their family members who are present today. We have discussed the limitations of medical therapy and the poor prognosis associated with symptomatic mitral regurgitation. We have also reviewed potential treatment options, including palliative medical therapy, conventional surgical mitral valve repair or replacement, and percutaneous mitral valve therapies such as edge-to-edge mitral valve approximation with MitraClip. We discussed treatment options in the context of this patient's specific comorbid medical conditions.  The patient is felt to be at high risk of conventional surgical mitral valve repair in the setting of his advanced age and chronic anxiety as well as his other comorbid medical conditions.  Transcatheter edge-to-edge mitral valve repair is a reasonable treatment alternative and I think the functional anatomy of his mitral valve would allow for transcatheter valve repair.  However, the patient has known coronary artery disease with previous stenting and should undergo right and left heart catheterization prior to elective transcatheter mitral valve edge-to-edge repair.  This will provide coronary artery assessment and hemodynamic evaluation.  I have reviewed the risks, indications, and alternatives to cardiac catheterization, possible angioplasty, and stenting with the patient. Risks include but are not limited to bleeding, infection, vascular injury, stroke, myocardial infection, arrhythmia, kidney injury, radiation-related injury in the case of prolonged fluoroscopy use, emergency cardiac surgery, and death. The  patient understands the risks of serious complication is 1-2 in 4098 with diagnostic cardiac cath and 1-2% or less with angioplasty/stenting.   The patient is also counseled specifically about the risks, indications, and alternatives to percutaneous mitral valve repair with MitraClip.  Procedural steps are discussed.  Expected recovery is reviewed.  Specific risks include vascular injury, bleeding, infection, arrhythmia, myocardial infarction, stroke, cardiac perforation, cardiac tamponade, device embolization, single leaflet detachment, endocarditis, mitral valve injury, emergency surgery, and death. They understand the risk of serious complication occurs at a rate of approximately 2%.  All of his questions are answered this afternoon.  Once his cardiac catheterization is completed, we will proceed with transcatheter edge-to-edge mitral valve repair if still indicated.  Deatra James 12/26/2020 3:08 PM  [address] [place] [address]  [phone] (office) [phone] (fax)

## 2020-12-26 NOTE — Patient Instructions (Signed)
Medication Instructions:  Your provider recommends that you continue on your current medications as directed. Please refer to the Current Medication list given to you today.   *If you need a refill on your cardiac medications before your next appointment, please call your pharmacy*  Lab Work: TODAY! BMET, CBC If you have labs (blood work) drawn today and your tests are completely normal, you will receive your results only by: Marland Kitchen MyChart Message (if you have MyChart) OR . A paper copy in the mail If you have any lab test that is abnormal or we need to change your treatment, we will call you to review the results.  Testing/Procedures: Your physician has requested that you have a cardiac catheterization. Cardiac catheterization is used to diagnose and/or treat various heart conditions. Doctors may recommend this procedure for a number of different reasons. The most common reason is to evaluate chest pain. Chest pain can be a symptom of coronary artery disease (CAD), and cardiac catheterization can show whether plaque is narrowing or blocking your heart's arteries. This procedure is also used to evaluate the valves, as well as measure the blood flow and oxygen levels in different parts of your heart. For further information please visit [email or web address]. Please follow instruction sheet, as given.

## 2020-12-26 NOTE — H&P (View-Only) (Signed)
[facility]   MULTIDISCIPLINARY HEART VALVE TEAM  Date:  12/26/2020   ID:  Ryan Mendoza, DOB [birth year], MRN [id number]  PCP:  Angelina Sheriff, MD   Chief Complaint  Patient presents with  . Shortness of Breath     HISTORY OF PRESENT ILLNESS: Ryan Mendoza is a 87 y.o. male who presents for evaluation of severe mitral regurgitation, referred by Dr Agustin Cree.  The patient has a history of coronary heart disease and he has undergone stenting of the LAD and left circumflex back in 2017.  He has developed symptoms of progressive shortness of breath with exertion, decreased energy, fatigue, and chest tightness over the past few years.  Because of his progressive symptoms, he underwent TEE evaluation to better assess mitral regurgitation which had previously been graded as moderate on 2D echo images.  Transesophageal echo demonstrated severe mitral regurgitation with a partially flail A2 and P2 and multiple eccentric jets of mitral regurgitation.  The patient is here with his daughter today. 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 He maintains a fairly active lifestyle and still drives a car. He is short of breath with raking leaves and he would not be able to walk the length of a city block because of fatigue and shortness of breath. He also complains of orthopnea and mild leg swelling. The patient has a lot of problems with anxiety as well.   The patient has had regular dental care and reports the need for partial dentures, but no active problems with his teeth or gums.   Past Medical History:  Diagnosis Date  . Anxiety   . Aortic valve regurgitation   . Arthritis    hands and knees  . BPH (benign prostatic hyperplasia)   . Bradycardia 11/22/2019  . Cataract, left eye   . Claustrophobia   . Coronary artery disease   . Coronary artery disease involving native  coronary artery of native heart with angina pectoris ([place]) 12/08/2018   PTCA and stenting of LAD and circumflex in 2017  Formatting of this note might be different from the original. PTCA and drug-eluting stent to LAD as well as the circumflex in June 2017  . Depression    no current meds for  . Dyspnea    DOE  . Dyspnea on exertion 12/08/2018  . Elevated cholesterol   . Essential hypertension 06/19/2019  . Gallstones   . GERD (gastroesophageal reflux disease)   . History of kidney stones   . Hyperparathyroidism, primary ([place]) 09/27/2017  . Hypertension   . Hypothyroidism   . Mitral regurgitation 12/08/2018   Mild to moderate by echo from August 2019  . Myocardial infarction ([place])    SILENT  . Obstructive sleep apnea 12/08/2018  . Primary hyperparathyroidism ([place])   . 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 Status post coronary artery stent placement 12/21/2017   Formatting of this note might be different from the original. LAD and Cx 2017  . Tinnitus    both ears all the time    Current Outpatient Medications  Medication Sig Dispense Refill  . amLODipine (NORVASC) 10 MG tablet Take 1 tablet (10 mg total) by mouth daily. 90 tablet 1  . aspirin EC 81 MG tablet Take 81 mg by mouth at bedtime.     Marland Kitchen  atorvastatin (LIPITOR) 40 MG tablet TAKE 1 TABLET BY MOUTH EVERYDAY AT BEDTIME 90 tablet 3  . busPIRone (BUSPAR) 5 MG tablet Take 5 mg by mouth 3 (three) times daily.    . cloNIDine (CATAPRES) 0.1 MG tablet Take 1 tablet (0.1 mg total) by mouth 2 (two) times daily. 180 tablet 3  . furosemide (LASIX) 20 MG tablet Take 1 tablet (20 mg total) by mouth daily. 30 tablet 1  . levothyroxine (SYNTHROID) 50 MCG tablet Take 50 mcg by mouth daily before breakfast.     . nitroGLYCERIN (NITROSTAT) 0.4 MG SL tablet Place 1 tablet (0.4 mg total) under the tongue every 5 (five) minutes as needed for chest pain. 25 tablet 3  . omeprazole (PRILOSEC) 40 MG capsule  Take 40 mg by mouth every morning.    . potassium chloride (KLOR-CON) 10 MEQ tablet Take 1 tablet (10 mEq total) by mouth daily. 30 tablet 1   No current facility-administered medications for this visit.    ALLERGIES:   Patient has no known allergies.   SOCIAL HISTORY:  The patient  reports that he has quit smoking. His smoking use included cigarettes. He has a 10.00 pack-year smoking history. He has never used smokeless tobacco. He reports that he does not drink alcohol and does not use drugs.   FAMILY HISTORY:  The patient's family history includes Heart attack in his father and mother; Heart disease in his father and mother.   REVIEW OF SYSTEMS:  Positive for belching, dysphagia, fatigue.   All other systems are reviewed and negative.   PHYSICAL EXAM: VS:  BP 140/60 (BP Location: Left Arm, Patient Position: Sitting, Cuff Size: Normal)   Pulse 64   Ht 5\' 9"  (1.753 m)   Wt 180 lb (81.6 kg)   SpO2 96%   BMI 26.58 kg/m  , BMI Body mass index is 26.58 kg/m. GEN: Well nourished, well developed, in no acute distress HEENT: normal Neck: No JVD. carotids 2+ without bruits or masses Cardiac: The heart is RRR with a grade 2/6 holosystolic murmur at the apex.  No edema. Pedal pulses 2+ = bilaterally  Respiratory:  clear to auscultation bilaterally GI: soft, nontender, nondistended, + BS MS: no deformity or atrophy Skin: warm and dry, no rash Neuro:  Strength and sensation are intact Psych: euthymic mood, full affect  EKG:  EKG from 10/07/2020 reviewed and demonstrates normal sinus rhythm with nonspecific ST abnormality  RECENT LABS: 08/06/2020: TSH 7.300 10/07/2020: BUN 28; Creatinine, Ser 1.74; Hemoglobin 15.5; Platelets 200; Potassium 5.0; Sodium 144  No results found for requested labs within last 8760 hours.   CrCl cannot be calculated (Patient's most recent lab result is older than the maximum 21 days allowed.).   Wt Readings from Last 3 Encounters:  12/26/20 180 lb (81.6 kg)   12/09/20 182 lb (82.6 kg)  11/12/20 188 lb (85.3 kg)     CARDIAC STUDIES:  TEE: IMPRESSIONS    1. Left ventricular ejection fraction, by estimation, is 50 to 55%. The  left ventricle has low normal function.  2. Right ventricular systolic function is low normal. The right  ventricular size is normal.  3. No left atrial/left atrial appendage thrombus was detected.  4. The mitral valve is abnormal. There is partially flail A2 and P2  segments (Carpentier Type II) with eccentric jets directed anterior and  posterior into LA. Regurgitation is severe, quantification difficult with  eccentricity and multiple jets.  5. Aortic insufficiency is eccentric, directed more posteriorly into  LV .  The aortic valve is tricuspid. Aortic valve regurgitation is moderate.  Mild aortic valve sclerosis is present, with no evidence of aortic valve  stenosis.  6. MIld fixed plaqing in the thoracic aorta.  7. Agitated saline contrast bubble study was negative, with no evidence  of any interatrial shunt.   STS RISK CALCULATOR: Mitral Valve Repair: Risk of Mortality: 3.828% Renal Failure: 1.978% Permanent Stroke: 3.435% Prolonged Ventilation: 7.847% DSW Infection: 0.055% Reoperation: 4.392% Morbidity or Mortality: 14.281% Short Length of Stay: 30.400% Long Length of Stay: 4.848%  Mitral Valve Replacement: Risk of Mortality: 2.941% Renal Failure: 2.113% Permanent Stroke: 1.280% Prolonged Ventilation: 12.667% DSW Infection: 0.105% Reoperation: 6.296% Morbidity or Mortality: 20.698% Short Length of Stay: 15.644% Long Length of Stay: 8.232%  ASSESSMENT AND PLAN: 1.  The patient has severe, stage D1 primary mitral regurgitation and associated New York Heart Association functional class III symptoms of chronic diastolic heart failure and exertional angina.  I have personally reviewed his TEE which demonstrates degenerative mitral valve disease with a partially flail P2  segment of the posterior mitral leaflet as well as mild anterior prolapse.  The patient has multiple jets of mitral regurgitation but qualitatively there felt to be severe when combined.  His symptoms have progressed over the last year and he currently is quite limited with functional class III symptoms.  I have reviewed the natural history of mitral regurgitation with the patient and their family members who are present today. We have discussed the limitations of medical therapy and the poor prognosis associated with symptomatic mitral regurgitation. We have also reviewed potential treatment options, including palliative medical therapy, conventional surgical mitral valve repair or replacement, and percutaneous mitral valve therapies such as edge-to-edge mitral valve approximation with MitraClip. We discussed treatment options in the context of this patient's specific comorbid medical conditions.  The patient is felt to be at high risk of conventional surgical mitral valve repair in the setting of his advanced age and chronic anxiety as well as his other comorbid medical conditions.  Transcatheter edge-to-edge mitral valve repair is a reasonable treatment alternative and I think the functional anatomy of his mitral valve would allow for transcatheter valve repair.  However, the patient has known coronary artery disease with previous stenting and should undergo right and left heart catheterization prior to elective transcatheter mitral valve edge-to-edge repair.  This will provide coronary artery assessment and hemodynamic evaluation.  I have reviewed the risks, indications, and alternatives to cardiac catheterization, possible angioplasty, and stenting with the patient. Risks include but are not limited to bleeding, infection, vascular injury, stroke, myocardial infection, arrhythmia, kidney injury, radiation-related injury in the case of prolonged fluoroscopy use, emergency cardiac surgery, and death. The  patient understands the risks of serious complication is 1-2 in 4098 with diagnostic cardiac cath and 1-2% or less with angioplasty/stenting.   The patient is also counseled specifically about the risks, indications, and alternatives to percutaneous mitral valve repair with MitraClip.  Procedural steps are discussed.  Expected recovery is reviewed.  Specific risks include vascular injury, bleeding, infection, arrhythmia, myocardial infarction, stroke, cardiac perforation, cardiac tamponade, device embolization, single leaflet detachment, endocarditis, mitral valve injury, emergency surgery, and death. They understand the risk of serious complication occurs at a rate of approximately 2%.  All of his questions are answered this afternoon.  Once his cardiac catheterization is completed, we will proceed with transcatheter edge-to-edge mitral valve repair if still indicated.  Deatra James 12/26/2020 3:08 PM  [address] [place] [address]  [phone] (office) [phone] (fax)

## 2020-12-27 LAB — CBC WITH DIFFERENTIAL/PLATELET
Basophils Absolute: 0.1 10*3/uL (ref 0.0–0.2)
Basos: 1 %
EOS (ABSOLUTE): 0.2 10*3/uL (ref 0.0–0.4)
Eos: 2 %
Hematocrit: 41.6 % (ref 37.5–51.0)
Hemoglobin: 14.3 g/dL (ref 13.0–17.7)
Immature Grans (Abs): 0 10*3/uL (ref 0.0–0.1)
Immature Granulocytes: 0 %
Lymphocytes Absolute: 2.6 10*3/uL (ref 0.7–3.1)
Lymphs: 31 %
MCH: 29.8 pg (ref 26.6–33.0)
MCHC: 34.4 g/dL (ref 31.5–35.7)
MCV: 87 fL (ref 79–97)
Monocytes Absolute: 0.7 10*3/uL (ref 0.1–0.9)
Monocytes: 8 %
Neutrophils Absolute: 5 10*3/uL (ref 1.4–7.0)
Neutrophils: 58 %
Platelets: 192 10*3/uL (ref 150–450)
RBC: 4.8 x10E6/uL (ref 4.14–5.80)
RDW: 13.3 % (ref 11.6–15.4)
WBC: 8.6 10*3/uL (ref 3.4–10.8)

## 2020-12-27 LAB — BASIC METABOLIC PANEL
BUN/Creatinine Ratio: 16 (ref 10–24)
BUN: 27 mg/dL (ref 8–27)
CO2: 23 mmol/L (ref 20–29)
Calcium: 9.4 mg/dL (ref 8.6–10.2)
Chloride: 108 mmol/L — ABNORMAL HIGH (ref 96–106)
Creatinine, Ser: 1.67 mg/dL — ABNORMAL HIGH (ref 0.76–1.27)
GFR calc Af Amer: 43 mL/min/{1.73_m2} — ABNORMAL LOW (ref >59)
GFR calc non Af Amer: 37 mL/min/{1.73_m2} — ABNORMAL LOW (ref >59)
Glucose: 86 mg/dL (ref 65–99)
Potassium: 4.7 mmol/L (ref 3.5–5.2)
Sodium: 144 mmol/L (ref 134–144)

## 2020-12-31 ENCOUNTER — Other Ambulatory Visit: Payer: Self-pay | Admitting: Thoracic Surgery (Cardiothoracic Vascular Surgery)

## 2021-01-02 ENCOUNTER — Telehealth: Payer: Self-pay | Admitting: *Deleted

## 2021-01-02 DIAGNOSIS — Z01812 Encounter for preprocedural laboratory examination: Secondary | ICD-10-CM

## 2021-01-02 DIAGNOSIS — I340 Nonrheumatic mitral (valve) insufficiency: Secondary | ICD-10-CM

## 2021-01-02 DIAGNOSIS — C44319 Basal cell carcinoma of skin of other parts of face: Secondary | ICD-10-CM | POA: Diagnosis not present

## 2021-01-02 NOTE — Telephone Encounter (Addendum)
I spoke with patient's daughter (DPR), Vickie, and made arrangements for patient to get BMP done 01/08/21 at the [facility] lab prior to [facility] 01/10/21.  Vickie states her father is not currently taking lasix after discussing with Dr Burt Knack at 12/26/20 office visit and was told okay to stop.  Vickie will confirm with her father that he is not taking lasix or potassium.   I will plan to follow-up with her next week after getting results of BMP 01/08/21.

## 2021-01-03 ENCOUNTER — Other Ambulatory Visit: Payer: Self-pay | Admitting: Emergency Medicine

## 2021-01-03 DIAGNOSIS — I340 Nonrheumatic mitral (valve) insufficiency: Secondary | ICD-10-CM

## 2021-01-06 ENCOUNTER — Encounter: Payer: Medicare Other | Admitting: Thoracic Surgery (Cardiothoracic Vascular Surgery)

## 2021-01-08 ENCOUNTER — Other Ambulatory Visit (HOSPITAL_COMMUNITY)
Admission: RE | Admit: 2021-01-08 | Discharge: 2021-01-08 | Disposition: A | Discharge status: Home / Self Care | Payer: Medicare Other | Source: Ambulatory Visit | Attending: Cardiovascular Disease | Admitting: Cardiovascular Disease

## 2021-01-08 DIAGNOSIS — Z01812 Encounter for preprocedural laboratory examination: Secondary | ICD-10-CM | POA: Diagnosis not present

## 2021-01-08 DIAGNOSIS — I340 Nonrheumatic mitral (valve) insufficiency: Secondary | ICD-10-CM | POA: Diagnosis not present

## 2021-01-08 DIAGNOSIS — Z20822 Contact with and (suspected) exposure to covid-19: Secondary | ICD-10-CM | POA: Diagnosis not present

## 2021-01-09 LAB — BASIC METABOLIC PANEL
BUN/Creatinine Ratio: 14 (ref 10–24)
BUN: 25 mg/dL (ref 8–27)
CO2: 20 mmol/L (ref 20–29)
Calcium: 9.1 mg/dL (ref 8.6–10.2)
Chloride: 107 mmol/L — ABNORMAL HIGH (ref 96–106)
Creatinine, Ser: 1.74 mg/dL — ABNORMAL HIGH (ref 0.76–1.27)
GFR calc Af Amer: 41 mL/min/{1.73_m2} — ABNORMAL LOW (ref >59)
GFR calc non Af Amer: 35 mL/min/{1.73_m2} — ABNORMAL LOW (ref >59)
Glucose: 141 mg/dL — ABNORMAL HIGH (ref 65–99)
Potassium: 4.2 mmol/L (ref 3.5–5.2)
Sodium: 142 mmol/L (ref 134–144)

## 2021-01-09 LAB — SARS CORONAVIRUS 2 (TAT 6-24 HRS): SARS Coronavirus 2: NEGATIVE

## 2021-01-09 NOTE — Telephone Encounter (Signed)
Pt contacted pre-catheterization scheduled at [facility] for: Friday January 10, 2021 12 Noon Verified arrival time and place: [facility]) at: 7 AM-pre-procedure hydration   No solid food after midnight prior to cath, clear liquids until 5 AM day of procedure.  Hold: Lasix/KCl -if taking hold day before and day of procedure  Except hold medications AM meds can be  taken pre-cath with sips of water including: ASA 81 mg   Confirmed patient has responsible adult to drive home post procedure and be with patient first 24 hours after arriving home:  You are allowed ONE visitor in the waiting room during the time you are at the hospital for your procedure. Both you and your visitor must wear a mask once you enter the hospital.   [facility] for patient's daughter (DPR), Vickie to review procedure instructions.

## 2021-01-09 NOTE — Telephone Encounter (Addendum)
Pt contacted pre-catheterization scheduled at [facility] for: Friday January 10, 2021 12 Noon Verified arrival time and place: [facility]) at: 7 AM-pre-procedure hydration   No solid food after midnight prior to cath, clear liquids until 5 AM day of procedure.  AM meds can be  taken pre-cath with sips of water including: ASA 81 mg Lasix and KCl-pt stopped taking after office visit with Dr Burt Knack 01/05/21.  Confirmed patient has responsible adult to drive home post procedure and be with patient first 24 hours after arriving home: yes  You are allowed ONE visitor in the waiting room during the time you are at the hospital for your procedure. Both you and your visitor must wear a mask once you enter the hospital.   Reviewed procedure/mask/visitor instructions, pre-procedure hydration with patient's daughter (DPR), Vickie.  Confirmed with Vickie that patient stopped Lasix and KCl after discussing with Dr Burt Knack at office visit 12/26/20.

## 2021-01-10 ENCOUNTER — Other Ambulatory Visit: Payer: Self-pay

## 2021-01-10 ENCOUNTER — Ambulatory Visit (HOSPITAL_COMMUNITY)
Admission: RE | Admit: 2021-01-10 | Discharge: 2021-01-10 | Disposition: A | Discharge status: Home / Self Care | Payer: Medicare Other | Attending: Cardiovascular Disease | Admitting: Cardiovascular Disease

## 2021-01-10 ENCOUNTER — Encounter (HOSPITAL_COMMUNITY)
Admission: RE | Disposition: A | Discharge status: Home / Self Care | Payer: Self-pay | Source: Home / Self Care | Attending: Cardiovascular Disease

## 2021-01-10 DIAGNOSIS — R0602 Shortness of breath: Secondary | ICD-10-CM | POA: Insufficient documentation

## 2021-01-10 DIAGNOSIS — Z79899 Other long term (current) drug therapy: Secondary | ICD-10-CM | POA: Insufficient documentation

## 2021-01-10 DIAGNOSIS — Z7982 Long term (current) use of aspirin: Secondary | ICD-10-CM | POA: Diagnosis not present

## 2021-01-10 DIAGNOSIS — I2510 Atherosclerotic heart disease of native coronary artery without angina pectoris: Secondary | ICD-10-CM | POA: Diagnosis not present

## 2021-01-10 DIAGNOSIS — I080 Rheumatic disorders of both mitral and aortic valves: Secondary | ICD-10-CM | POA: Insufficient documentation

## 2021-01-10 DIAGNOSIS — I340 Nonrheumatic mitral (valve) insufficiency: Secondary | ICD-10-CM | POA: Diagnosis not present

## 2021-01-10 DIAGNOSIS — Z955 Presence of coronary angioplasty implant and graft: Secondary | ICD-10-CM | POA: Diagnosis not present

## 2021-01-10 DIAGNOSIS — Z79890 Hormone replacement therapy (postmenopausal): Secondary | ICD-10-CM | POA: Insufficient documentation

## 2021-01-10 DIAGNOSIS — Z87891 Personal history of nicotine dependence: Secondary | ICD-10-CM | POA: Diagnosis not present

## 2021-01-10 HISTORY — PX: RIGHT/LEFT HEART CATH AND CORONARY ANGIOGRAPHY: CATH118266

## 2021-01-10 LAB — POCT I-STAT 7, (LYTES, BLD GAS, ICA,H+H)
Acid-base deficit: 1 mmol/L (ref 0.0–2.0)
Acid-base deficit: 1 mmol/L (ref 0.0–2.0)
Acid-base deficit: 1 mmol/L (ref 0.0–2.0)
Bicarbonate: 24.7 mmol/L (ref 20.0–28.0)
Bicarbonate: 25.1 mmol/L (ref 20.0–28.0)
Bicarbonate: 24.5 mmol/L (ref 20.0–28.0)
Calcium, Ion: 1.17 mmol/L (ref 1.15–1.40)
Calcium, Ion: 1.23 mmol/L (ref 1.15–1.40)
Calcium, Ion: 1.14 mmol/L — ABNORMAL LOW (ref 1.15–1.40)
HCT: 37 % — ABNORMAL LOW (ref 39.0–52.0)
HCT: 37 % — ABNORMAL LOW (ref 39.0–52.0)
HCT: 36 % — ABNORMAL LOW (ref 39.0–52.0)
Hemoglobin: 12.6 g/dL — ABNORMAL LOW (ref 13.0–17.0)
Hemoglobin: 12.6 g/dL — ABNORMAL LOW (ref 13.0–17.0)
Hemoglobin: 12.2 g/dL — ABNORMAL LOW (ref 13.0–17.0)
O2 Saturation: 83 %
O2 Saturation: 99 %
O2 Saturation: 84 %
Potassium: 3.8 mmol/L (ref 3.5–5.1)
Potassium: 3.8 mmol/L (ref 3.5–5.1)
Potassium: 3.7 mmol/L (ref 3.5–5.1)
Sodium: 143 mmol/L (ref 135–145)
Sodium: 144 mmol/L (ref 135–145)
Sodium: 144 mmol/L (ref 135–145)
TCO2: 26 mmol/L (ref 22–32)
TCO2: 26 mmol/L (ref 22–32)
TCO2: 26 mmol/L (ref 22–32)
pCO2 arterial: 44.8 mmHg (ref 32.0–48.0)
pCO2 arterial: 44.9 mmHg (ref 32.0–48.0)
pCO2 arterial: 44.8 mmHg (ref 32.0–48.0)
pH, Arterial: 7.35 (ref 7.350–7.450)
pH, Arterial: 7.355 (ref 7.350–7.450)
pH, Arterial: 7.347 — ABNORMAL LOW (ref 7.350–7.450)
pO2, Arterial: 141 mmHg — ABNORMAL HIGH (ref 83.0–108.0)
pO2, Arterial: 51 mmHg — ABNORMAL LOW (ref 83.0–108.0)
pO2, Arterial: 51 mmHg — ABNORMAL LOW (ref 83.0–108.0)

## 2021-01-10 SURGERY — RIGHT/LEFT HEART CATH AND CORONARY ANGIOGRAPHY
Anesthesia: LOCAL

## 2021-01-10 MED ORDER — SODIUM CHLORIDE 0.9 % IV SOLN: 250.0000 mL | INTRAVENOUS | Status: DC | PRN

## 2021-01-10 MED ORDER — SODIUM CHLORIDE 0.9 % WEIGHT BASED INFUSION
3.0000 mL/kg/h | INTRAVENOUS | Status: AC
  Administered 2021-01-10: 3 mL/kg/h via INTRAVENOUS

## 2021-01-10 MED ORDER — MIDAZOLAM HCL 2 MG/2ML IJ SOLN
INTRAMUSCULAR | Status: DC | PRN
  Administered 2021-01-10: 1 mg via INTRAVENOUS

## 2021-01-10 MED ORDER — HEPARIN (PORCINE) IN NACL 1000-0.9 UT/500ML-% IV SOLN
INTRAVENOUS | Status: AC
  Filled 2021-01-10: qty 1000

## 2021-01-10 MED ORDER — FENTANYL CITRATE (PF) 100 MCG/2ML IJ SOLN
INTRAMUSCULAR | Status: AC
  Filled 2021-01-10: qty 2

## 2021-01-10 MED ORDER — LIDOCAINE HCL (PF) 1 % IJ SOLN
INTRAMUSCULAR | Status: AC
  Filled 2021-01-10: qty 30

## 2021-01-10 MED ORDER — IOHEXOL 350 MG/ML SOLN
INTRAVENOUS | Status: AC
  Filled 2021-01-10: qty 1

## 2021-01-10 MED ORDER — VERAPAMIL HCL 2.5 MG/ML IV SOLN
INTRAVENOUS | Status: AC
  Filled 2021-01-10: qty 2

## 2021-01-10 MED ORDER — HEPARIN SODIUM (PORCINE) 1000 UNIT/ML IJ SOLN
INTRAMUSCULAR | Status: AC
  Filled 2021-01-10: qty 1

## 2021-01-10 MED ORDER — IOHEXOL 350 MG/ML SOLN
INTRAVENOUS | Status: DC | PRN
  Administered 2021-01-10: 25 mL

## 2021-01-10 MED ORDER — VERAPAMIL HCL 2.5 MG/ML IV SOLN
INTRAVENOUS | Status: DC | PRN
  Administered 2021-01-10: 10 mL via INTRA_ARTERIAL

## 2021-01-10 MED ORDER — ONDANSETRON HCL 4 MG/2ML IJ SOLN: 4.0000 mg | Freq: Four times a day (QID) | INTRAMUSCULAR | Status: DC | PRN

## 2021-01-10 MED ORDER — ACETAMINOPHEN 325 MG PO TABS: 650.0000 mg | ORAL_TABLET | ORAL | Status: DC | PRN

## 2021-01-10 MED ORDER — HEPARIN (PORCINE) IN NACL 1000-0.9 UT/500ML-% IV SOLN
INTRAVENOUS | Status: DC | PRN
  Administered 2021-01-10 (×2): 500 mL

## 2021-01-10 MED ORDER — SODIUM CHLORIDE 0.9% FLUSH: 3.0000 mL | Freq: Two times a day (BID) | INTRAVENOUS | Status: DC

## 2021-01-10 MED ORDER — LIDOCAINE HCL (PF) 1 % IJ SOLN
INTRAMUSCULAR | Status: DC | PRN
  Administered 2021-01-10 (×2): 2 mL

## 2021-01-10 MED ORDER — SODIUM CHLORIDE 0.9% FLUSH: 3.0000 mL | INTRAVENOUS | Status: DC | PRN

## 2021-01-10 MED ORDER — HEPARIN SODIUM (PORCINE) 1000 UNIT/ML IJ SOLN
INTRAMUSCULAR | Status: DC | PRN
  Administered 2021-01-10: 4000 [IU] via INTRAVENOUS

## 2021-01-10 MED ORDER — SODIUM CHLORIDE 0.9 % WEIGHT BASED INFUSION: 1.0000 mL/kg/h | INTRAVENOUS | Status: DC

## 2021-01-10 MED ORDER — HYDRALAZINE HCL 20 MG/ML IJ SOLN: 10.0000 mg | INTRAMUSCULAR | Status: DC | PRN

## 2021-01-10 MED ORDER — FENTANYL CITRATE (PF) 100 MCG/2ML IJ SOLN
INTRAMUSCULAR | Status: DC | PRN
  Administered 2021-01-10: 25 ug via INTRAVENOUS

## 2021-01-10 MED ORDER — SODIUM CHLORIDE 0.9 % IV SOLN: INTRAVENOUS | Status: DC

## 2021-01-10 MED ORDER — MIDAZOLAM HCL 2 MG/2ML IJ SOLN
INTRAMUSCULAR | Status: AC
  Filled 2021-01-10: qty 2

## 2021-01-10 MED ORDER — ASPIRIN 81 MG PO CHEW: 81.0000 mg | CHEWABLE_TABLET | ORAL | Status: DC

## 2021-01-10 MED ORDER — LABETALOL HCL 5 MG/ML IV SOLN: 10.0000 mg | INTRAVENOUS | Status: DC | PRN

## 2021-01-10 SURGICAL SUPPLY — 11 items

## 2021-01-10 NOTE — Interval H&P Note (Signed)
History and Physical Interval Note:  01/10/2021 1:13 PM  Ryan Mendoza  has presented today for surgery, with the diagnosis of MR.  The various methods of treatment have been discussed with the patient and family. After consideration of risks, benefits and other options for treatment, the patient has consented to  Procedure(s): RIGHT/LEFT HEART CATH AND CORONARY ANGIOGRAPHY (N/A) as a surgical intervention.  The patient's history has been reviewed, patient examined, no change in status, stable for surgery.  I have reviewed the patient's chart and labs.  Questions were answered to the patient's satisfaction.     Sherren Mocha

## 2021-01-10 NOTE — Progress Notes (Signed)
Pt ambulated without difficulty or bleeding.   Discharged home with daughter who will drive and stay with pt x 24 hrs 

## 2021-01-10 NOTE — Discharge Instructions (Signed)

## 2021-01-10 NOTE — Research (Signed)
PHDEV Informed Consent   Subject Name: Ryan Mendoza  Subject met inclusion and exclusion criteria.  The informed consent form, study requirements and expectations were reviewed with the subject and questions and concerns were addressed prior to the signing of the consent form.  The subject verbalized understanding of the trail requirements.  The subject agreed to participate in the [facility] trial and signed the informed consent.  The informed consent was obtained prior to performance of any protocol-specific procedures for the subject.  A copy of the signed informed consent was given to the subject and a copy was placed in the subject's medical record.  Mena Goes. 01/10/2021, 08:15 AM

## 2021-01-13 ENCOUNTER — Encounter (HOSPITAL_COMMUNITY): Payer: Self-pay | Admitting: Cardiovascular Disease

## 2021-01-15 ENCOUNTER — Other Ambulatory Visit: Payer: Self-pay

## 2021-01-15 DIAGNOSIS — I340 Nonrheumatic mitral (valve) insufficiency: Secondary | ICD-10-CM

## 2021-01-16 ENCOUNTER — Other Ambulatory Visit: Payer: Self-pay

## 2021-01-20 DIAGNOSIS — K219 Gastro-esophageal reflux disease without esophagitis: Secondary | ICD-10-CM | POA: Diagnosis not present

## 2021-01-20 NOTE — Progress Notes (Signed)
Surgical Instructions    Your procedure is scheduled on February 10  Report to [facility] Main Entrance "A" at 0530 A.M., then check in with the Admitting office.  Call this number if you have problems the morning of surgery:  [phone]   If you have any questions prior to your surgery date call [phone]: Open Monday-Friday 8am-4pm    Remember:  Do not eat or drink after midnight the night before your surgery    Take these medicines the morning of surgery with A SIP OF WATER  amLODipine (NORVASC)  Aspirin busPIRone (BUSPAR) Eye drops if needed cloNIDine (CATAPRES)  levothyroxine (SYNTHROID) omeprazole (PRILOSEC)    As of today, STOP taking anyAleve, Naproxen, Ibuprofen, Motrin, Advil, Goody's, BC's, all herbal medications, fish oil, and all vitamins.                     Do not wear jewelry            Do not wear lotions, powders, colognes, or deodorant.            Men may shave face and neck.            Do not bring valuables to the hospital.            [facility] is not responsible for any belongings or valuables.  Do NOT Smoke (Tobacco/Vaping) or drink Alcohol 24 hours prior to your procedure If you use a CPAP at night, you may bring all equipment for your overnight stay.   Contacts, glasses, dentures or bridgework may not be worn into surgery, please bring cases for these belongings   For patients admitted to the hospital, discharge time will be determined by your treatment team.   Patients discharged the day of surgery will not be allowed to drive home, and someone needs to stay with them for 24 hours.    Special instructions:   [place]- Preparing For Surgery  Before surgery, you can play an important role. Because skin is not sterile, your skin needs to be as free of germs as possible. You can reduce the number of germs on your skin by washing with CHG (chlorahexidine gluconate) Soap before surgery.  CHG is an antiseptic cleaner which kills germs and  bonds with the skin to continue killing germs even after washing.    Oral Hygiene is also important to reduce your risk of infection.  Remember - BRUSH YOUR TEETH THE MORNING OF SURGERY WITH YOUR REGULAR TOOTHPASTE  Please do not use if you have an allergy to CHG or antibacterial soaps. If your skin becomes reddened/irritated stop using the CHG.  Do not shave (including legs and underarms) for at least 48 hours prior to first CHG shower. It is OK to shave your face.  Please follow these instructions carefully.   1. Shower the NIGHT BEFORE SURGERY and the MORNING OF SURGERY  2. If you chose to wash your hair, wash your hair first as usual with your normal shampoo.  3. After you shampoo, rinse your hair and body thoroughly to remove the shampoo.  4. Wash Face and genitals (private parts) with your normal soap.   5.  Shower the NIGHT BEFORE SURGERY and the MORNING OF SURGERY with CHG Soap.   6. Use CHG Soap as you would any other liquid soap. You can apply CHG directly to the skin and wash gently with a scrungie or a clean washcloth.   7. Apply the CHG Soap to  your body ONLY FROM THE NECK DOWN.  Do not use on open wounds or open sores. Avoid contact with your eyes, ears, mouth and genitals (private parts). Wash Face and genitals (private parts)  with your normal soap.   8. Wash thoroughly, paying special attention to the area where your surgery will be performed.  9. Thoroughly rinse your body with warm water from the neck down.  10. DO NOT shower/wash with your normal soap after using and rinsing off the CHG Soap.  11. Pat yourself dry with a CLEAN TOWEL.  12. Wear CLEAN PAJAMAS to bed the night before surgery  13. Place CLEAN SHEETS on your bed the night before your surgery  14. DO NOT SLEEP WITH PETS.   Day of Surgery: Wear Clean/Comfortable clothing the morning of surgery Do not apply any deodorants/lotions.   Remember to brush your teeth WITH YOUR REGULAR TOOTHPASTE.    Please read over the following fact sheets that you were given.

## 2021-01-21 ENCOUNTER — Other Ambulatory Visit (HOSPITAL_COMMUNITY)
Admission: RE | Admit: 2021-01-21 | Discharge: 2021-01-21 | Disposition: A | Discharge status: Home / Self Care | Payer: Medicare Other | Source: Ambulatory Visit | Attending: Cardiovascular Disease | Admitting: Cardiovascular Disease

## 2021-01-21 ENCOUNTER — Encounter: Payer: Self-pay | Admitting: Physical Therapy

## 2021-01-21 ENCOUNTER — Other Ambulatory Visit: Payer: Self-pay

## 2021-01-21 ENCOUNTER — Encounter (HOSPITAL_COMMUNITY): Payer: Self-pay

## 2021-01-21 ENCOUNTER — Ambulatory Visit (HOSPITAL_COMMUNITY)
Admission: RE | Admit: 2021-01-21 | Discharge: 2021-01-21 | Disposition: A | Discharge status: Home / Self Care | Payer: Medicare Other | Source: Ambulatory Visit | Attending: Cardiovascular Disease | Admitting: Cardiovascular Disease

## 2021-01-21 ENCOUNTER — Ambulatory Visit: Payer: Medicare Other | Attending: Cardiovascular Disease | Admitting: Physical Therapy

## 2021-01-21 ENCOUNTER — Encounter (HOSPITAL_COMMUNITY)
Admission: RE | Admit: 2021-01-21 | Discharge: 2021-01-21 | Disposition: A | Discharge status: Home / Self Care | Payer: Medicare Other | Source: Ambulatory Visit | Attending: Cardiovascular Disease | Admitting: Cardiovascular Disease

## 2021-01-21 DIAGNOSIS — Z8249 Family history of ischemic heart disease and other diseases of the circulatory system: Secondary | ICD-10-CM | POA: Diagnosis not present

## 2021-01-21 DIAGNOSIS — I252 Old myocardial infarction: Secondary | ICD-10-CM | POA: Diagnosis not present

## 2021-01-21 DIAGNOSIS — Z954 Presence of other heart-valve replacement: Secondary | ICD-10-CM | POA: Diagnosis not present

## 2021-01-21 DIAGNOSIS — I340 Nonrheumatic mitral (valve) insufficiency: Secondary | ICD-10-CM

## 2021-01-21 DIAGNOSIS — Z006 Encounter for examination for normal comparison and control in clinical research program: Secondary | ICD-10-CM | POA: Diagnosis not present

## 2021-01-21 DIAGNOSIS — Z01818 Encounter for other preprocedural examination: Secondary | ICD-10-CM | POA: Insufficient documentation

## 2021-01-21 DIAGNOSIS — Z955 Presence of coronary angioplasty implant and graft: Secondary | ICD-10-CM | POA: Diagnosis not present

## 2021-01-21 DIAGNOSIS — Z7982 Long term (current) use of aspirin: Secondary | ICD-10-CM | POA: Diagnosis not present

## 2021-01-21 DIAGNOSIS — I5032 Chronic diastolic (congestive) heart failure: Secondary | ICD-10-CM | POA: Diagnosis not present

## 2021-01-21 DIAGNOSIS — Z87891 Personal history of nicotine dependence: Secondary | ICD-10-CM | POA: Diagnosis not present

## 2021-01-21 DIAGNOSIS — Z79899 Other long term (current) drug therapy: Secondary | ICD-10-CM | POA: Diagnosis not present

## 2021-01-21 DIAGNOSIS — F419 Anxiety disorder, unspecified: Secondary | ICD-10-CM | POA: Diagnosis present

## 2021-01-21 DIAGNOSIS — R2689 Other abnormalities of gait and mobility: Secondary | ICD-10-CM | POA: Diagnosis not present

## 2021-01-21 DIAGNOSIS — Z79890 Hormone replacement therapy (postmenopausal): Secondary | ICD-10-CM | POA: Diagnosis not present

## 2021-01-21 DIAGNOSIS — E210 Primary hyperparathyroidism: Secondary | ICD-10-CM | POA: Diagnosis not present

## 2021-01-21 DIAGNOSIS — R001 Bradycardia, unspecified: Secondary | ICD-10-CM | POA: Diagnosis not present

## 2021-01-21 DIAGNOSIS — Z20822 Contact with and (suspected) exposure to covid-19: Secondary | ICD-10-CM | POA: Diagnosis not present

## 2021-01-21 DIAGNOSIS — I2510 Atherosclerotic heart disease of native coronary artery without angina pectoris: Secondary | ICD-10-CM | POA: Diagnosis not present

## 2021-01-21 DIAGNOSIS — N400 Enlarged prostate without lower urinary tract symptoms: Secondary | ICD-10-CM | POA: Diagnosis present

## 2021-01-21 DIAGNOSIS — I083 Combined rheumatic disorders of mitral, aortic and tricuspid valves: Secondary | ICD-10-CM | POA: Diagnosis not present

## 2021-01-21 DIAGNOSIS — Z7901 Long term (current) use of anticoagulants: Secondary | ICD-10-CM | POA: Diagnosis not present

## 2021-01-21 DIAGNOSIS — G4733 Obstructive sleep apnea (adult) (pediatric): Secondary | ICD-10-CM | POA: Diagnosis not present

## 2021-01-21 DIAGNOSIS — I10 Essential (primary) hypertension: Secondary | ICD-10-CM | POA: Diagnosis not present

## 2021-01-21 DIAGNOSIS — K219 Gastro-esophageal reflux disease without esophagitis: Secondary | ICD-10-CM | POA: Diagnosis not present

## 2021-01-21 DIAGNOSIS — E7800 Pure hypercholesterolemia, unspecified: Secondary | ICD-10-CM | POA: Diagnosis not present

## 2021-01-21 DIAGNOSIS — I110 Hypertensive heart disease with heart failure: Secondary | ICD-10-CM | POA: Diagnosis not present

## 2021-01-21 DIAGNOSIS — E039 Hypothyroidism, unspecified: Secondary | ICD-10-CM | POA: Diagnosis not present

## 2021-01-21 LAB — CBC
HCT: 40.3 % (ref 39.0–52.0)
Hemoglobin: 13.6 g/dL (ref 13.0–17.0)
MCH: 30 pg (ref 26.0–34.0)
MCHC: 33.7 g/dL (ref 30.0–36.0)
MCV: 88.8 fL (ref 80.0–100.0)
Platelets: 204 10*3/uL (ref 150–400)
RBC: 4.54 MIL/uL (ref 4.22–5.81)
RDW: 14.2 % (ref 11.5–15.5)
WBC: 7.5 10*3/uL (ref 4.0–10.5)
nRBC: 0 % (ref 0.0–0.2)

## 2021-01-21 LAB — TYPE AND SCREEN
ABO/RH(D): O POS
Antibody Screen: NEGATIVE

## 2021-01-21 LAB — URINALYSIS, ROUTINE W REFLEX MICROSCOPIC
Bilirubin Urine: NEGATIVE
Glucose, UA: NEGATIVE mg/dL
Hgb urine dipstick: NEGATIVE
Ketones, ur: NEGATIVE mg/dL
Leukocytes,Ua: NEGATIVE
Nitrite: NEGATIVE
Protein, ur: NEGATIVE mg/dL
Specific Gravity, Urine: 1.025 (ref 1.005–1.030)
pH: 5 (ref 5.0–8.0)

## 2021-01-21 LAB — BLOOD GAS, ARTERIAL
Acid-base deficit: 0.9 mmol/L (ref 0.0–2.0)
Bicarbonate: 23.6 mmol/L (ref 20.0–28.0)
Drawn by: 58793
FIO2: 21
O2 Saturation: 96.4 %
Patient temperature: 37
pCO2 arterial: 41 mmHg (ref 32.0–48.0)
pH, Arterial: 7.378 (ref 7.350–7.450)
pO2, Arterial: 86.3 mmHg (ref 83.0–108.0)

## 2021-01-21 LAB — COMPREHENSIVE METABOLIC PANEL
ALT: 22 U/L (ref 0–44)
AST: 22 U/L (ref 15–41)
Albumin: 4 g/dL (ref 3.5–5.0)
Alkaline Phosphatase: 83 U/L (ref 38–126)
Anion gap: 11 (ref 5–15)
BUN: 27 mg/dL — ABNORMAL HIGH (ref 8–23)
CO2: 20 mmol/L — ABNORMAL LOW (ref 22–32)
Calcium: 9.1 mg/dL (ref 8.9–10.3)
Chloride: 109 mmol/L (ref 98–111)
Creatinine, Ser: 1.68 mg/dL — ABNORMAL HIGH (ref 0.61–1.24)
GFR, Estimated: 40 mL/min — ABNORMAL LOW (ref >60)
Glucose, Bld: 131 mg/dL — ABNORMAL HIGH (ref 70–99)
Potassium: 4.3 mmol/L (ref 3.5–5.1)
Sodium: 140 mmol/L (ref 135–145)
Total Bilirubin: 1 mg/dL (ref 0.3–1.2)
Total Protein: 6.4 g/dL — ABNORMAL LOW (ref 6.5–8.1)

## 2021-01-21 LAB — PROTIME-INR
INR: 1.1 (ref 0.8–1.2)
Prothrombin Time: 13.3 seconds (ref 11.4–15.2)

## 2021-01-21 LAB — SURGICAL PCR SCREEN
MRSA, PCR: NEGATIVE
Staphylococcus aureus: POSITIVE — AB

## 2021-01-21 LAB — SARS CORONAVIRUS 2 (TAT 6-24 HRS): SARS Coronavirus 2: NEGATIVE

## 2021-01-21 NOTE — Therapy (Signed)
[place], [place], [address] Phone: [phone]   Fax:  [phone]  Physical Therapy Evaluation  Patient Details  Name: Ryan Mendoza MRN: [id number] Date of Birth: 11/02/1934 Referring Provider (PT): Dr Sherren Mocha   Encounter Date: 01/21/2021   PT End of Session - 01/21/21 1321    Visit Number 1    Number of Visits 12    Date for PT Re-Evaluation 03/04/21    PT Start Time 1230    PT Stop Time 1254    PT Time Calculation (min) 24 min    Activity Tolerance Patient tolerated treatment well    Behavior During Therapy [facility] for tasks assessed/performed           Past Medical History:  Diagnosis Date  . Anxiety   . Aortic valve regurgitation   . Arthritis    hands and knees  . BPH (benign prostatic hyperplasia)   . Bradycardia 11/22/2019  . Cataract, left eye   . Claustrophobia   . Coronary artery disease   . Coronary artery disease involving native coronary artery of native heart with angina pectoris ([place]) 12/08/2018   PTCA and stenting of LAD and circumflex in 2017  Formatting of this note might be different from the original. PTCA and drug-eluting stent to LAD as well as the circumflex in June 2017  . Depression    no current meds for  . Dyspnea    DOE  . Dyspnea on exertion 12/08/2018  . Elevated cholesterol   . Essential hypertension 06/19/2019  . Gallstones   . GERD (gastroesophageal reflux disease)   . History of kidney stones   . Hyperparathyroidism, primary ([place]) 09/27/2017  . Hypertension   . Hypothyroidism   . Mitral regurgitation 12/08/2018   Mild to moderate by echo from August 2019  . Myocardial infarction ([place])    SILENT  . Obstructive sleep apnea 12/08/2018  . Primary hyperparathyroidism ([place])   . 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 Status post coronary artery stent placement 12/21/2017   Formatting of this note might be  different from the original. LAD and Cx 2017  . Tinnitus    both ears all the time    Past Surgical History:  Procedure Laterality Date  . BUBBLE STUDY  10/11/2020   Procedure: BUBBLE STUDY;  Surgeon: Fay Records, MD;  Location: [place];  Service: Cardiovascular;;  . CATARACT EXTRACTION W/PHACO Left 04/12/2018   Procedure: CATARACT EXTRACTION PHACO AND INTRAOCULAR LENS PLACEMENT ([place]);  Surgeon: Birder Robson, MD;  Location: [facility];  Service: Ophthalmology;  Laterality: Left;  [place] 00:39.5 AP% 16.7 CDE 6.62 Fluid Pack Lot # [id number]  . CORONARY ANGIOPLASTY     2017  . EYE SURGERY Right    ioc for cataract  . PARATHYROIDECTOMY Right 09/28/2017   Procedure: RIGHT INFERIOR PARATHYROIDECTOMY;  Surgeon: Armandina Gemma, MD;  Location: [facility];  Service: General;  Laterality: Right;  . right eye  plug for tear duct surgery    . RIGHT/LEFT HEART CATH AND CORONARY ANGIOGRAPHY N/A 01/10/2021   Procedure: RIGHT/LEFT HEART CATH AND CORONARY ANGIOGRAPHY;  Surgeon: Sherren Mocha, MD;  Location: [facility];  Service: Cardiovascular;  Laterality: N/A;  . stents to heart x 2  06/2016  . TEE WITHOUT CARDIOVERSION N/A 10/11/2020   Procedure: TRANSESOPHAGEAL ECHOCARDIOGRAM (TEE);  Surgeon: Fay Records, MD;  Location: [place] [facility];  Service: Cardiovascular;  Laterality: N/A;  . TONSILLECTOMY     age 87    There were no vitals filed for this visit.    Subjective Assessment - 01/21/21 1231    Subjective Patient has had shortness and breath and chest pain for about 4 years. over the past few months it has become worse. he was found to have mitral valve regugitation. He feels it the most when he stands and walks. his chest gets tight.    Pertinent History athritis in hands and knees. Knees have been scoped which helped; CAD, Angina,    Currently in Pain? No/denies              [facility] PT Assessment - 01/21/21 0001      Assessment   Medical Diagnosis Miral Valve Regurgitation     Referring Provider (PT) Dr Sherren Mocha    Onset Date/Surgical Date --   4 years but worse ver the past 2 months   Hand Dominance Right    Prior Therapy None      Precautions   Precautions None      Restrictions   Weight Bearing Restrictions No      Balance Screen   Has the patient fallen in the past 6 months No    Has the patient had a decrease in activity level because of a fear of falling?  No    Is the patient reluctant to leave their home because of a fear of falling?  No      Home Environment   Additional Comments About 4-5 steps into the carport      Prior Function   Level of Independence Independent    Vocation Retired    Biomedical scientist retired Youth worker   Overall Cognitive Status Within Functional Limits for tasks assessed    Attention Focused    Focused Attention Appears intact    Memory Appears intact    Awareness Appears intact    Problem Solving Appears intact      Sensation   Additional Comments feet can get hot and cold at times      Coordination   Gross Motor Movements are Fluid and Coordinated Yes    Fine Motor Movements are Fluid and Coordinated Yes      ROM / Strength   AROM / PROM / Strength AROM;PROM;Strength      Strength   Strength Assessment Site Hand    Right/Left hand Right;Left    Right Hand Grip (lbs) 70    Left Hand Grip (lbs) 70            [facility] Pre-Surgical Assessment - 01/21/21 0001    5 Meter Walk Test- trial 1 4 sec    5 Meter Walk Test- trial 2 5 sec.     5 Meter Walk Test- trial 3 5 sec.    5 meter walk test average 4.67 sec    4 Stage Balance Test tolerated for:  10 sec.    4 Stage Balance Test Position 4    comment able to maintain single leg stance bilateral    Sit To Stand Test- trial 1 5 sec.    Comment 15 sec    ADL/IADL Independent with: Bathing;Dressing;Meal prep;Finances;Yard work    6 Minute Walk- Baseline yes    BP (mmHg) 136/66    HR (bpm) 64    02 Sat (%RA) 95 %    Modified Borg  Scale for Dyspnea 2- Mild shortness of breath  Perceived Rate of Exertion (Borg) 6-    6 Minute Walk Post Test yes    BP (mmHg) 140/68    HR (bpm) 78    02 Sat (%RA) 98 %    Modified Borg Scale for Dyspnea 3- Moderate shortness of breath or breathing difficulty    Perceived Rate of Exertion (Borg) 11- Fairly light    Aerobic Endurance Distance Walked 910    Endurance additional comments Patient ambualted 740' in 3:33 before requing a seated rest break of 1 minute. He then was able to stand and ambualte 170 more feet.                    Objective measurements completed on examination: See above findings.               PT Education - 01/21/21 1321    Education Details resoning behind testing    Person(s) Educated Patient    Methods Explanation    Comprehension Verbalized understanding                       Plan - 01/21/21 1324    Clinical Impression Statement See below    Stability/Clinical Decision Making Stable/Uncomplicated    Clinical Decision Making Low    Rehab Potential Good    PT Frequency One time visit    PT Treatment/Interventions Patient/family education           Clinical Impression Statement: Pt is a 87 yo M presenting to OP PT for evaluation prior to possible Mitraclip surgery due to severe mitral regurgitation. Pt reports onset of dypnea and chest tightness  approximately 4 years but worsening symptoms 2 months ago. Symptoms are limiting ability to ambualte and perfrom yards wokr.Marland Kitchen Pt presents with normal ROM and strength, good balance and is not at high fall risk 4 stage balance test, decreased walking speed and decreased aerobic endurance per 6 minute walk test .Patient ambualted 740' in 3:33 before requing a seated rest break of 1 minute. He then was able to stand and ambualte 170 more feet. , patient's HR was 84 bpm and O2 was 98% on room air. Pt reported 3/10 shortness of breath on modified scale for dyspnea. Pt ambulated a  total of 910 feet in 6 minute walk. B/P did not  increase significantly with 6 minute walk test. Based on the Short Physical Performance Battery, patient has a frailty rating of 10/12 with </= 5/12 considered frail.     Visit Diagnosis: Other abnormalities of gait and mobility     Problem List Patient Active Problem List   Diagnosis Date Noted  . Tinnitus   . Scab   . Primary hyperparathyroidism ([place])   . Myocardial infarction ([place])   . Hypothyroidism   . Hypertension   . History of kidney stones   . GERD (gastroesophageal reflux disease)   . Gallstones   . Elevated cholesterol   . Dyspnea   . Coronary artery disease   . Claustrophobia   . Cataract, left eye   . BPH (benign prostatic hyperplasia)   . Arthritis   . Aortic valve regurgitation   . Bradycardia 11/22/2019  . Essential hypertension 06/19/2019  . Anxiety 03/16/2019  . Mitral regurgitation 12/08/2018  . Dyspnea on exertion 12/08/2018  . Coronary artery disease involving native coronary artery of native heart with angina pectoris ([place]) 12/08/2018  . Obstructive sleep apnea 12/08/2018  . Status post coronary artery stent placement 12/21/2017  .  Hyperparathyroidism, primary ([place]) 09/27/2017    Carney Living PT DPT  01/21/2021, 1:26 PM  [facility] [address] [place], [place], [address] Phone: [phone]   Fax:  [phone]  Name: Ryan Mendoza MRN: [id number] Date of Birth: 21-May-1934

## 2021-01-21 NOTE — Progress Notes (Signed)
PCP - Lovette Cliche Cardiologist - Dr. Ulyses Jarred. Cooper  Chest x-ray - 01/21/21 EKG - 01/10/21 Stress Test - 12/19/18 ECHO - 03/01/20 Cardiac Cath - 01/10/21  Sleep Study - h/o OSA CPAP - does not use  Aspirin Instructions: continue ASA including DOS. Patient instructed to hold all  NSAID's, herbal medications, fish oil and vitamins as of today  COVID TEST- 01/21/21 at at [place]. Pt instructed to remain in their car. 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    Anesthesia review: cardiac history  Patient denies shortness of breath, fever, cough and chest pain at PAT appointment   All instructions explained to the patient, with a verbal understanding of the material. Patient agrees to go over the instructions while at home for a better understanding. Patient also instructed to self quarantine after being tested for COVID-19. The opportunity to ask questions was provided.

## 2021-01-22 NOTE — Anesthesia Preprocedure Evaluation (Addendum)
Anesthesia Evaluation  Patient identified by MRN, date of birth, ID band Patient awake    Reviewed: Allergy & Precautions, NPO status , Patient's Chart, lab work & pertinent test results  Airway Mallampati: III  TM Distance: >3 FB     Dental  (+) Dental Advisory Given   Pulmonary shortness of breath, sleep apnea , former smoker,    breath sounds clear to auscultation       Cardiovascular hypertension, Pt. on medications + CAD, + Past MI and + Cardiac Stents  + Valvular Problems/Murmurs MR  Rhythm:Regular Rate:Normal  10/21 Echo: EF 55%. Severe MR. Moderate AI.   Neuro/Psych negative neurological ROS     GI/Hepatic Neg liver ROS, GERD  ,  Endo/Other  Hypothyroidism   Renal/GU Renal InsufficiencyRenal disease     Musculoskeletal  (+) Arthritis ,   Abdominal   Peds  Hematology negative hematology ROS (+)   Anesthesia Other Findings   Reproductive/Obstetrics                            Lab Results  Component Value Date   WBC 7.5 01/21/2021   HGB 13.6 01/21/2021   HCT 40.3 01/21/2021   MCV 88.8 01/21/2021   PLT 204 01/21/2021   Lab Results  Component Value Date   CREATININE 1.68 (H) 01/21/2021   BUN 27 (H) 01/21/2021   NA 140 01/21/2021   K 4.3 01/21/2021   CL 109 01/21/2021   CO2 20 (L) 01/21/2021    Anesthesia Physical Anesthesia Plan  ASA: III  Anesthesia Plan: General   Post-op Pain Management:    Induction: Intravenous  PONV Risk Score and Plan: 2 and Ondansetron, Dexamethasone and Treatment may vary due to age or medical condition  Airway Management Planned: Oral ETT  Additional Equipment: Arterial line  Intra-op Plan:   Post-operative Plan: Extubation in OR  Informed Consent: I have reviewed the patients History and Physical, chart, labs and discussed the procedure including the risks, benefits and alternatives for the proposed anesthesia with the patient or  authorized representative who has indicated his/her understanding and acceptance.     Dental advisory given  Plan Discussed with: CRNA  Anesthesia Plan Comments: (2IV's, a-line. GETA.)       Anesthesia Quick Evaluation

## 2021-01-23 ENCOUNTER — Encounter (HOSPITAL_COMMUNITY)
Admission: RE | Disposition: A | Discharge status: Home / Self Care | Payer: Self-pay | Source: Home / Self Care | Attending: Cardiovascular Disease

## 2021-01-23 ENCOUNTER — Inpatient Hospital Stay (HOSPITAL_COMMUNITY): Payer: Medicare Other

## 2021-01-23 ENCOUNTER — Inpatient Hospital Stay (HOSPITAL_COMMUNITY)
Admission: RE | Admit: 2021-01-23 | Discharge: 2021-01-24 | DRG: 267 | Disposition: A | Discharge status: Home / Self Care | Payer: Medicare Other | Attending: Cardiovascular Disease | Admitting: Cardiovascular Disease

## 2021-01-23 ENCOUNTER — Inpatient Hospital Stay (HOSPITAL_COMMUNITY): Payer: Medicare Other | Admitting: Anesthesiology

## 2021-01-23 ENCOUNTER — Inpatient Hospital Stay (HOSPITAL_COMMUNITY): Payer: Medicare Other | Admitting: Physician Assistant

## 2021-01-23 DIAGNOSIS — Z7901 Long term (current) use of anticoagulants: Secondary | ICD-10-CM

## 2021-01-23 DIAGNOSIS — Z79899 Other long term (current) drug therapy: Secondary | ICD-10-CM

## 2021-01-23 DIAGNOSIS — E039 Hypothyroidism, unspecified: Secondary | ICD-10-CM | POA: Diagnosis not present

## 2021-01-23 DIAGNOSIS — N400 Enlarged prostate without lower urinary tract symptoms: Secondary | ICD-10-CM | POA: Diagnosis present

## 2021-01-23 DIAGNOSIS — Z006 Encounter for examination for normal comparison and control in clinical research program: Secondary | ICD-10-CM

## 2021-01-23 DIAGNOSIS — I2510 Atherosclerotic heart disease of native coronary artery without angina pectoris: Secondary | ICD-10-CM | POA: Diagnosis not present

## 2021-01-23 DIAGNOSIS — K219 Gastro-esophageal reflux disease without esophagitis: Secondary | ICD-10-CM | POA: Diagnosis not present

## 2021-01-23 DIAGNOSIS — Z95818 Presence of other cardiac implants and grafts: Secondary | ICD-10-CM

## 2021-01-23 DIAGNOSIS — I252 Old myocardial infarction: Secondary | ICD-10-CM | POA: Diagnosis not present

## 2021-01-23 DIAGNOSIS — Z8249 Family history of ischemic heart disease and other diseases of the circulatory system: Secondary | ICD-10-CM

## 2021-01-23 DIAGNOSIS — I110 Hypertensive heart disease with heart failure: Secondary | ICD-10-CM | POA: Diagnosis present

## 2021-01-23 DIAGNOSIS — Z87891 Personal history of nicotine dependence: Secondary | ICD-10-CM

## 2021-01-23 DIAGNOSIS — Z955 Presence of coronary angioplasty implant and graft: Secondary | ICD-10-CM | POA: Diagnosis not present

## 2021-01-23 DIAGNOSIS — I5032 Chronic diastolic (congestive) heart failure: Secondary | ICD-10-CM | POA: Diagnosis present

## 2021-01-23 DIAGNOSIS — Z7982 Long term (current) use of aspirin: Secondary | ICD-10-CM

## 2021-01-23 DIAGNOSIS — R001 Bradycardia, unspecified: Secondary | ICD-10-CM | POA: Diagnosis present

## 2021-01-23 DIAGNOSIS — I340 Nonrheumatic mitral (valve) insufficiency: Secondary | ICD-10-CM

## 2021-01-23 DIAGNOSIS — Z9889 Other specified postprocedural states: Secondary | ICD-10-CM

## 2021-01-23 DIAGNOSIS — Z20822 Contact with and (suspected) exposure to covid-19: Secondary | ICD-10-CM | POA: Diagnosis present

## 2021-01-23 DIAGNOSIS — I10 Essential (primary) hypertension: Secondary | ICD-10-CM | POA: Diagnosis present

## 2021-01-23 DIAGNOSIS — Z954 Presence of other heart-valve replacement: Secondary | ICD-10-CM | POA: Diagnosis not present

## 2021-01-23 DIAGNOSIS — F419 Anxiety disorder, unspecified: Secondary | ICD-10-CM | POA: Diagnosis not present

## 2021-01-23 DIAGNOSIS — Z79890 Hormone replacement therapy (postmenopausal): Secondary | ICD-10-CM

## 2021-01-23 DIAGNOSIS — G4733 Obstructive sleep apnea (adult) (pediatric): Secondary | ICD-10-CM | POA: Diagnosis present

## 2021-01-23 DIAGNOSIS — E7800 Pure hypercholesterolemia, unspecified: Secondary | ICD-10-CM | POA: Diagnosis present

## 2021-01-23 DIAGNOSIS — I083 Combined rheumatic disorders of mitral, aortic and tricuspid valves: Principal | ICD-10-CM | POA: Diagnosis present

## 2021-01-23 DIAGNOSIS — E210 Primary hyperparathyroidism: Secondary | ICD-10-CM | POA: Diagnosis present

## 2021-01-23 DIAGNOSIS — Z01818 Encounter for other preprocedural examination: Secondary | ICD-10-CM

## 2021-01-23 HISTORY — PX: MITRAL VALVE REPAIR: CATH118311

## 2021-01-23 HISTORY — DX: Presence of other cardiac implants and grafts: Z95.818

## 2021-01-23 HISTORY — PX: TEE WITHOUT CARDIOVERSION: SHX5443

## 2021-01-23 HISTORY — DX: Other specified postprocedural states: Z98.890

## 2021-01-23 LAB — ECHO TEE
Area-P 1/2: 2.1 cm2
MV M vel: 4.8 m/s
MV Peak grad: 92.2 mmHg
MV VTI: 2.16 cm2
Radius: 0.5 cm

## 2021-01-23 LAB — ABO/RH: ABO/RH(D): O POS

## 2021-01-23 SURGERY — MITRAL VALVE REPAIR
Anesthesia: General

## 2021-01-23 MED ORDER — LEVOTHYROXINE SODIUM 50 MCG PO TABS
50.0000 ug | ORAL_TABLET | Freq: Every day | ORAL | Status: DC
  Administered 2021-01-24: 50 ug via ORAL
  Filled 2021-01-23: qty 1

## 2021-01-23 MED ORDER — SODIUM CHLORIDE 0.9% FLUSH
3.0000 mL | Freq: Two times a day (BID) | INTRAVENOUS | Status: DC
  Administered 2021-01-23 – 2021-01-24 (×2): 3 mL via INTRAVENOUS

## 2021-01-23 MED ORDER — HEPARIN (PORCINE) IN NACL 1000-0.9 UT/500ML-% IV SOLN
INTRAVENOUS | Status: DC | PRN
  Administered 2021-01-23: 500 mL

## 2021-01-23 MED ORDER — HEPARIN SODIUM (PORCINE) 1000 UNIT/ML IJ SOLN
INTRAMUSCULAR | Status: DC | PRN
  Administered 2021-01-23: 15000 [IU] via INTRAVENOUS

## 2021-01-23 MED ORDER — BUSPIRONE HCL 10 MG PO TABS
5.0000 mg | ORAL_TABLET | Freq: Three times a day (TID) | ORAL | Status: DC
  Administered 2021-01-23 – 2021-01-24 (×3): 5 mg via ORAL
  Filled 2021-01-23 (×3): qty 1

## 2021-01-23 MED ORDER — SODIUM CHLORIDE 0.9% FLUSH: 3.0000 mL | INTRAVENOUS | Status: DC | PRN

## 2021-01-23 MED ORDER — VANCOMYCIN HCL IN DEXTROSE 1-5 GM/200ML-% IV SOLN
INTRAVENOUS | Status: AC
  Filled 2021-01-23: qty 200

## 2021-01-23 MED ORDER — CHLORHEXIDINE GLUCONATE 4 % EX LIQD: 60.0000 mL | Freq: Once | CUTANEOUS | Status: DC

## 2021-01-23 MED ORDER — PHENYLEPHRINE HCL-NACL 10-0.9 MG/250ML-% IV SOLN
INTRAVENOUS | Status: DC | PRN
  Administered 2021-01-23: 25 ug/min via INTRAVENOUS

## 2021-01-23 MED ORDER — SODIUM CHLORIDE 0.9 % IV SOLN: INTRAVENOUS | Status: DC

## 2021-01-23 MED ORDER — HEPARIN (PORCINE) IN NACL 2000-0.9 UNIT/L-% IV SOLN
INTRAVENOUS | Status: AC
  Filled 2021-01-23: qty 1000

## 2021-01-23 MED ORDER — CHLORHEXIDINE GLUCONATE 0.12 % MT SOLN
15.0000 mL | Freq: Once | OROMUCOSAL | Status: AC
  Administered 2021-01-23: 15 mL via OROMUCOSAL
  Filled 2021-01-23: qty 15

## 2021-01-23 MED ORDER — FENTANYL CITRATE (PF) 250 MCG/5ML IJ SOLN
INTRAMUSCULAR | Status: DC | PRN
  Administered 2021-01-23: 100 ug via INTRAVENOUS

## 2021-01-23 MED ORDER — ACETAMINOPHEN 500 MG PO TABS
1000.0000 mg | ORAL_TABLET | Freq: Once | ORAL | Status: AC
  Administered 2021-01-23: 1000 mg via ORAL
  Filled 2021-01-23: qty 2

## 2021-01-23 MED ORDER — PROTAMINE SULFATE 10 MG/ML IV SOLN
INTRAVENOUS | Status: DC | PRN
  Administered 2021-01-23: 30 mg via INTRAVENOUS

## 2021-01-23 MED ORDER — CLONIDINE HCL 0.1 MG PO TABS
0.1000 mg | ORAL_TABLET | Freq: Two times a day (BID) | ORAL | Status: DC
  Administered 2021-01-23 – 2021-01-24 (×2): 0.1 mg via ORAL
  Filled 2021-01-23 (×2): qty 1

## 2021-01-23 MED ORDER — NITROGLYCERIN 0.4 MG SL SUBL: 0.4000 mg | SUBLINGUAL_TABLET | SUBLINGUAL | Status: DC | PRN

## 2021-01-23 MED ORDER — CLOPIDOGREL BISULFATE 75 MG PO TABS
75.0000 mg | ORAL_TABLET | Freq: Every day | ORAL | Status: DC
  Administered 2021-01-23 – 2021-01-24 (×2): 75 mg via ORAL
  Filled 2021-01-23 (×2): qty 1

## 2021-01-23 MED ORDER — ZOLPIDEM TARTRATE 5 MG PO TABS
5.0000 mg | ORAL_TABLET | Freq: Every day | ORAL | Status: DC
  Administered 2021-01-23: 5 mg via ORAL
  Filled 2021-01-23: qty 1

## 2021-01-23 MED ORDER — CHLORHEXIDINE GLUCONATE 4 % EX LIQD: 30.0000 mL | CUTANEOUS | Status: DC

## 2021-01-23 MED ORDER — LORAZEPAM 2 MG/ML IJ SOLN
0.5000 mg | Freq: Once | INTRAMUSCULAR | Status: AC
  Administered 2021-01-23: 0.5 mg via INTRAVENOUS
  Filled 2021-01-23: qty 1

## 2021-01-23 MED ORDER — SUGAMMADEX SODIUM 200 MG/2ML IV SOLN
INTRAVENOUS | Status: DC | PRN
  Administered 2021-01-23: 200 mg via INTRAVENOUS

## 2021-01-23 MED ORDER — PROPOFOL 10 MG/ML IV BOLUS
INTRAVENOUS | Status: DC | PRN
  Administered 2021-01-23: 20 mg via INTRAVENOUS
  Administered 2021-01-23: 80 mg via INTRAVENOUS

## 2021-01-23 MED ORDER — ASPIRIN EC 81 MG PO TBEC
81.0000 mg | DELAYED_RELEASE_TABLET | Freq: Every day | ORAL | Status: DC
  Administered 2021-01-24: 81 mg via ORAL
  Filled 2021-01-23: qty 1

## 2021-01-23 MED ORDER — ONDANSETRON HCL 4 MG/2ML IJ SOLN: 4.0000 mg | Freq: Four times a day (QID) | INTRAMUSCULAR | Status: DC | PRN

## 2021-01-23 MED ORDER — ATORVASTATIN CALCIUM 40 MG PO TABS
40.0000 mg | ORAL_TABLET | Freq: Every day | ORAL | Status: DC
  Administered 2021-01-23: 40 mg via ORAL
  Filled 2021-01-23: qty 1

## 2021-01-23 MED ORDER — VANCOMYCIN HCL 1250 MG/250ML IV SOLN
1250.0000 mg | INTRAVENOUS | Status: AC
  Administered 2021-01-23: 1250 mg via INTRAVENOUS
  Filled 2021-01-23: qty 250

## 2021-01-23 MED ORDER — LIDOCAINE 2% (20 MG/ML) 5 ML SYRINGE
INTRAMUSCULAR | Status: DC | PRN
  Administered 2021-01-23: 60 mg via INTRAVENOUS

## 2021-01-23 MED ORDER — SODIUM CHLORIDE 0.9 % IV SOLN: 250.0000 mL | INTRAVENOUS | Status: DC | PRN

## 2021-01-23 MED ORDER — HEPARIN (PORCINE) IN NACL 1000-0.9 UT/500ML-% IV SOLN
INTRAVENOUS | Status: AC
  Filled 2021-01-23: qty 500

## 2021-01-23 MED ORDER — HEPARIN (PORCINE) IN NACL 2000-0.9 UNIT/L-% IV SOLN
INTRAVENOUS | Status: DC | PRN
  Administered 2021-01-23 (×3): 1000 mL

## 2021-01-23 MED ORDER — LACTATED RINGERS IV SOLN: INTRAVENOUS | Status: DC

## 2021-01-23 MED ORDER — ACETAMINOPHEN 325 MG PO TABS: 650.0000 mg | ORAL_TABLET | ORAL | Status: DC | PRN

## 2021-01-23 MED ORDER — SODIUM CHLORIDE 0.9 % IV SOLN
1.5000 g | INTRAVENOUS | Status: AC
  Administered 2021-01-23: 1.5 g via INTRAVENOUS
  Filled 2021-01-23: qty 1.5

## 2021-01-23 MED ORDER — PANTOPRAZOLE SODIUM 40 MG PO TBEC
40.0000 mg | DELAYED_RELEASE_TABLET | Freq: Every day | ORAL | Status: DC
  Administered 2021-01-24: 40 mg via ORAL
  Filled 2021-01-23: qty 1

## 2021-01-23 MED ORDER — DEXAMETHASONE SODIUM PHOSPHATE 10 MG/ML IJ SOLN
INTRAMUSCULAR | Status: DC | PRN
  Administered 2021-01-23: 10 mg via INTRAVENOUS

## 2021-01-23 MED ORDER — ONDANSETRON HCL 4 MG/2ML IJ SOLN
INTRAMUSCULAR | Status: DC | PRN
  Administered 2021-01-23: 4 mg via INTRAVENOUS

## 2021-01-23 MED ORDER — ROCURONIUM BROMIDE 10 MG/ML (PF) SYRINGE
PREFILLED_SYRINGE | INTRAVENOUS | Status: DC | PRN
  Administered 2021-01-23: 50 mg via INTRAVENOUS

## 2021-01-23 MED ORDER — EPHEDRINE SULFATE-NACL 50-0.9 MG/10ML-% IV SOSY
PREFILLED_SYRINGE | INTRAVENOUS | Status: DC | PRN
  Administered 2021-01-23: 10 mg via INTRAVENOUS
  Administered 2021-01-23: 5 mg via INTRAVENOUS
  Administered 2021-01-23: 10 mg via INTRAVENOUS

## 2021-01-23 SURGICAL SUPPLY — 24 items
BAG SNAP BAND KOVER 36X36 (MISCELLANEOUS) ×2 IMPLANT
BLANKET WARM UNDERBOD FULL ACC (MISCELLANEOUS) ×2 IMPLANT
CATH INFINITI JR4 5F (CATHETERS) ×2 IMPLANT
CATH MITRA STEERABLE GUIDE (CATHETERS) ×2 IMPLANT
CLIP MITRA G4 DELIVERY SYS NTW (Clip) ×2 IMPLANT
CLOSURE PERCLOSE PROSTYLE (VASCULAR PRODUCTS) ×4 IMPLANT
COVER DOME SNAP 22 D (MISCELLANEOUS) ×2 IMPLANT
ELECT DEFIB PAD ADLT CADENCE (PAD) ×2 IMPLANT
GUIDEWIRE SAFE TJ AMPLATZ EXST (WIRE) ×2 IMPLANT
KIT DILATOR VASC 18G NDL (KITS) ×2 IMPLANT
KIT HEART LEFT (KITS) ×4 IMPLANT
KIT MICROPUNCTURE NIT STIFF (SHEATH) ×2 IMPLANT
KIT VERSACROSS LRG ACCESS (CATHETERS) ×2 IMPLANT
PACK CARDIAC CATHETERIZATION (CUSTOM PROCEDURE TRAY) ×2 IMPLANT
SHEATH PINNACLE 8F 10CM (SHEATH) ×2 IMPLANT
SHEATH PROBE COVER 6X72 (BAG) ×2 IMPLANT
SHIELD RADPAD SCOOP 12X17 (MISCELLANEOUS) ×2 IMPLANT
STOPCOCK MORSE 400PSI 3WAY (MISCELLANEOUS) ×12 IMPLANT
SYSTEM MITRACLIP G4 (SYSTAGENIX WOUND MANAGEMENT) ×2 IMPLANT
TRANSDUCER W/STOPCOCK (MISCELLANEOUS) ×2 IMPLANT
TUBING ART PRESS 72  MALE/FEM (TUBING) ×2
TUBING ART PRESS 72 MALE/FEM (TUBING) ×1 IMPLANT
TUBING CIL FLEX 10 FLL-RA (TUBING) ×2 IMPLANT
WIRE EMERALD 3MM-J .035X150CM (WIRE) ×2 IMPLANT

## 2021-01-23 NOTE — Transfer of Care (Signed)
Immediate Anesthesia Transfer of Care Note  Patient: Ryan Mendoza  Procedure(s) Performed: MITRAL VALVE REPAIR (N/A ) TRANSESOPHAGEAL ECHOCARDIOGRAM (TEE) (N/A )  Patient Location: Cath Lab  Anesthesia Type:General  Level of Consciousness: drowsy and patient cooperative  Airway & Oxygen Therapy: Patient Spontanous Breathing and Patient connected to nasal cannula oxygen  Post-op Assessment: Report given to RN and Post -op Vital signs reviewed and stable  Post vital signs: Reviewed and stable  Last Vitals:  Vitals Value Taken Time  BP 132/73 01/23/21 1013  Temp    Pulse 72 01/23/21 1013  Resp 12 01/23/21 1013  SpO2 97 % 01/23/21 1013  Vitals shown include unvalidated device data.  Last Pain:  Vitals:   01/23/21 0602  PainSc: 0-No pain         Complications: No complications documented.

## 2021-01-23 NOTE — Plan of Care (Signed)

## 2021-01-23 NOTE — Progress Notes (Addendum)
3:30 pt started bleeding from femoral site. No hematoma noted. RN held pressure for about 20 min. Bleeding has stopped. Vitals stable. Pt c/p of severe anxiety. 2L O2 applied for comfort, sats 99%. MD notified. Verbal order given: valium IV 5mg .

## 2021-01-23 NOTE — Interval H&P Note (Signed)
History and Physical Interval Note:  01/23/2021 7:32 AM  Ryan Mendoza  has presented today for surgery, with the diagnosis of Severe Mitral Insufficiency.  The various methods of treatment have been discussed with the patient and family. After consideration of risks, benefits and other options for treatment, the patient has consented to  Procedure(s): MITRAL VALVE REPAIR (N/A) TRANSESOPHAGEAL ECHOCARDIOGRAM (TEE) (N/A) as a surgical intervention.  The patient's history has been reviewed, patient examined, no change in status, stable for surgery.  I have reviewed the patient's chart and labs.  Questions were answered to the patient's satisfaction.    Pt evaluated in short stay area. No change in symptoms or exam. Endorses NYHA functional class 3 symptoms of exertional dyspnea and fatigue. Questions answered. Stable to proceed with transcatheter edge to edge mitral valve repair.   Sherren Mocha

## 2021-01-23 NOTE — Anesthesia Procedure Notes (Signed)
Arterial Line Insertion Start/End2/09/2021 7:05 AM, 01/23/2021 7:20 AM Performed by: Lance Coon, CRNA, CRNA  Preanesthetic checklist: patient identified, IV checked, site marked, risks and benefits discussed, surgical consent, monitors and equipment checked, pre-op evaluation, timeout performed and anesthesia consent Lidocaine 1% used for infiltration Left, radial was placed Catheter size: 20 G Hand hygiene performed , maximum sterile barriers used  and Seldinger technique used  Attempts: 2 Procedure performed without using ultrasound guided technique. Following insertion, dressing applied and Biopatch. Post procedure assessment: normal and unchanged  Patient tolerated the procedure well with no immediate complications.

## 2021-01-23 NOTE — Discharge Summary (Addendum)
[place] VALVE TEAM  Discharge Summary    Patient ID: Ryan Mendoza MRN: [id number]; DOB: 1934/12/04  Admit date: 01/23/2021 Discharge date: 01/24/2021  Primary Care Provider: Angelina Sheriff, MD  Primary Cardiologist: Dr. Agustin Cree   Discharge Diagnoses    Principal Problem:   S/P mitral valve clip implantation Active Problems:   Obstructive sleep apnea   Essential hypertension   Primary hyperparathyroidism (HCC)   Hypothyroidism   GERD (gastroesophageal reflux disease)   Elevated cholesterol   Coronary artery disease   BPH (benign prostatic hyperplasia)   Non-rheumatic mitral regurgitation   Allergies No Known Allergies  Diagnostic Studies/Procedures  01/23/2021 MITRAL VALVE REPAIR  Conclusion Successful transcatheter edge-to-edge mitral valve repair using a single G4 NT W MitraClip device, placed on the lateral side of A2/P2.  Mitral regurgitation reduced from 4+ preprocedure to 1+ post procedure with a residual eccentric jet along the medial aspect of P2.  Recommend: Aspirin and clopidogrel x3 months Limited echo tomorrow morning  _________________________   Echo 01/24/21: complete but pending formal read at the time of discharge     History of Present Illness     Ryan Mendoza is a 87 y.o. male with a history of CAD s/p PCIs, HTN, HLD, hypothyroidism, bradycardia, anxiety and severe mitral regurgiation who presented to [facility] on 01/23/21 for planned TEER.    Patient's cardiac history dates back to 2017 when he reportedly presented with symptoms of chest pain and shortness of breath.  He was treated at the time at [facility] where he apparently underwent PCI and stenting of the left anterior descending coronary artery and the left circumflex coronary artery.  Over the past 2 years he has had worsening and progressive symptoms of exertional shortness of breath, decreased energy, fatigue, tightness  across his chest, and increasing anxiety.  In January 2020 he developed particularly a severe episode of substernal chest discomfort for which he was briefly hospitalized in [place], [place].  On review of the records from that admission he apparently was thought to be in atrial fibrillation on presentation but spontaneously converted to sinus rhythm.  He ruled out for acute myocardial infarction.  Catheterization performed at that time revealed nonobstructive coronary artery disease and the patient was told that his chest pain was likely related to ulcer disease.  Over the past 2 years he has complained of worsening fatigue and exhaustion.  He has had episodes of increased exertional shortness of breath with occasional resting shortness of breath. Transthoracic echocardiogram performed March 2021 revealed normal left ventricular systolic function with severely dilated left atrium, moderate mitral regurgitation and mild to moderate aortic regurgitation. He was evaluated by Dr. Caryl Comes for concerns of possible symptomatic bradycardia, but findings have not been suggestive of symptomatic bradycardia nor chronotropic incompetence.  He was seen in follow-up recently by Dr. Agustin Cree and transesophageal echocardiogram was recommended to further evaluate severity of mitral regurgitation.  TEE performed October 11, 2020 revealed what was felt to be severe mitral regurgitation with partially flail A2 and P2 segments of both the anterior and posterior leaflets and multiple eccentric jets of regurgitation.  There was felt to be moderate aortic insufficiency.  Left ventricular function was reportedly "low normal" with ejection fraction estimated 50 to 55%.   The patient has been evaluated by the multidisciplinary valve team and felt to have severe, symptomatic non rheumiatic mitral regurgitation and to be a suitable candidate for TEER, which was  set up for 01/23/21.    Hospital Course     Consultants: none  Severe  non rheumatic mitral regurgitation: s/p successful transcatheter edge-to-edge mitral valve repair using a single G4 NT W MitraClip device, placed on the lateral side of A2/P2.  Mitral regurgitation reduced from 4+ preprocedure to 1+ post procedure with a residual eccentric jet along the medial aspect of P2. Post operative echo completed but pending formal read. Plan for DAPT with aspirin and Plavix x 3 months followed by aspirin alone. Discharge home today with close follow up in the clinic next week.  GERD: Prilosec was changed to Protonix given potential drug drug interaction with Plavix. Prilosec can be resumed when he finishes his 3 month course of plavix   _____________  Discharge Vitals Blood pressure 119/62, pulse 68, temperature (!) 97.4 F (36.3 C), temperature source Oral, resp. rate 13, height 5\' 7"  (1.702 m), weight 79.9 kg, SpO2 96 %.  Filed Weights   01/23/21 0602  Weight: 79.9 kg    GEN: Well nourished, well developed, in no acute distress HEENT: normal Neck: no JVD or masses Cardiac: RRR; 2/6 holosystolic murmur at apex, no rubs, or gallops,no edema  Respiratory:  clear to auscultation bilaterally, normal work of breathing GI: soft, nontender, nondistended, + BS MS: no deformity or atrophy Skin: warm and dry, no rash.  Groin site clear without hematoma or ecchymosis  Neuro:  Alert and Oriented x 3, Strength and sensation are intact Psych: euthymic mood, full affect   Labs & Radiologic Studies    CBC Recent Labs    01/21/21 1114 01/24/21 0013  WBC 7.5 10.6*  HGB 13.6 13.1  HCT 40.3 40.1  MCV 88.8 88.1  PLT 204 825   Basic Metabolic Panel Recent Labs    01/21/21 1114 01/24/21 0013  NA 140 138  K 4.3 4.0  CL 109 105  CO2 20* 22  GLUCOSE 131* 168*  BUN 27* 25*  CREATININE 1.68* 1.65*  CALCIUM 9.1 8.9   Liver Function Tests Recent Labs    01/21/21 1114  AST 22  ALT 22  ALKPHOS 83  BILITOT 1.0  PROT 6.4*  ALBUMIN 4.0   No results for input(s):  LIPASE, AMYLASE in the last 72 hours. Cardiac Enzymes No results for input(s): CKTOTAL, CKMB, CKMBINDEX, TROPONINI in the last 72 hours. BNP Invalid input(s): POCBNP D-Dimer No results for input(s): DDIMER in the last 72 hours. Hemoglobin A1C No results for input(s): HGBA1C in the last 72 hours. Fasting Lipid Panel No results for input(s): CHOL, HDL, LDLCALC, TRIG, CHOLHDL, LDLDIRECT in the last 72 hours. Thyroid Function Tests No results for input(s): TSH, T4TOTAL, T3FREE, THYROIDAB in the last 72 hours.  Invalid input(s): FREET3 _____________  DG Chest 2 View  Result Date: 01/21/2021 CLINICAL DATA:  Mitral valve insufficiency, preop evaluation EXAM: CHEST - 2 VIEW COMPARISON:  02/18/2020 FINDINGS: Cardiac shadow is within normal limits. Aortic calcifications are seen. Lungs are clear bilaterally. No focal infiltrate or effusion is seen. No bony abnormality is noted. IMPRESSION: No acute abnormality noted. Electronically Signed   By: Inez Catalina M.D.   On: 01/21/2021 16:17   CARDIAC CATHETERIZATION  Result Date: 01/23/2021 Successful transcatheter edge-to-edge mitral valve repair using a single G4 NT W MitraClip device, placed on the lateral side of A2/P2.  Mitral regurgitation reduced from 4+ preprocedure to 1+ post procedure with a residual eccentric jet along the medial aspect of P2. Recommend: Aspirin and clopidogrel x3 months Limited echo tomorrow morning  CARDIAC CATHETERIZATION  Result Date: 01/10/2021 1.  Patent left main with no significant stenosis 2.  Patent LAD stent with mild nonobstructive plaquing in the LAD 3.  Patent left circumflex stent with 50% diffuse in-stent restenosis and 50% ostial circumflex stenosis, no high-grade disease throughout the circumflex distribution 4.  Diffuse nonobstructive RCA stenosis (large dominant vessel) 5.  Normal right heart pressures Plan: continue evaluation for transcatheter edge to edge mitral valve repair. Medical therapy for  nonobstructive CAD.   ECHO TEE  Result Date: 01/23/2021    TRANSESOPHOGEAL ECHO REPORT   Patient Name:   Ryan Mendoza Date of Exam: 01/23/2021 Medical Rec #:  [id number]     Height:       67.0 in Accession #:    [id number]    Weight:       176.2 lb Date of Birth:  1934-06-01    BSA:          1.916 m Patient Age:    21 years      BP:           122/46 mmHg Patient Gender: M             HR:           56 bpm. Exam Location:  Inpatient Procedure: Transesophageal Echo, Color Doppler, Cardiac Doppler and 3D Echo Indications:     Severe mitral regurgitation [[id number]]  History:         Patient has prior history of Echocardiogram examinations, most                  recent 10/13/2020. CAD and Previous Myocardial Infarction,                  Mitral Valve Disease, Arrythmias:PAC and Bradycardia,                  Signs/Symptoms:Dyspnea; Risk Factors:Hypertension and Sleep                  Apnea.                   Mitral Valve: Mitra Clip NTW valve is present in the mitral                  position. Procedure Date: 01/23/2021.  Sonographer:     Darlina Sicilian RDCS Referring Phys:  [place] Diagnosing Phys: Jenkins Rouge MD PROCEDURE: After discussion of the risks and benefits of a TEE, an informed consent was obtained from the patient. The patient was intubated. The transesophogeal probe was passed without difficulty through the esophogus of the patient. Imaged were obtained with the patient in a supine position. Sedation performed by different physician. The patient was monitored while under deep sedation. Anesthestetic sedation was provided intravenously by Anesthesiology: 100mg  of Propofol, 60mg  of Lidocaine. The  patient's vital signs; including heart rate, blood pressure, and oxygen saturation; remained stable throughout the procedure. The patient developed no complications during the procedure. IMPRESSIONS  1. Left ventricular ejection fraction, by estimation, is 60 to 65%. The left ventricle has normal  function.  2. Right ventricular systolic function is normal. The right ventricular size is normal.  3. Left atrial size was moderately dilated. No left atrial/left atrial appendage thrombus was detected.  4. Right atrial size was moderately dilated.  5. Pre-Clip: Severe MR Two eccentric jets. Largest from large prolapsing A1 segment directed posteriorly. Smaller anteriorly directed jet from small flail medial P2 segment  MVA 6.45 cm2 mean grdient 2 mmHg         Post Clip A single NTW clip placed between A12/P2 mild grade 1 residual posteriorly directed MR Mean gradient 3 mmHg MVA 4.39 cm2 . The mitral valve is abnormal. Severe mitral valve regurgitation. No evidence of mitral stenosis. There is a Nutritional therapist  NTW present in the mitral position. Procedure Date: 01/23/2021.  6. Tricuspid valve regurgitation is moderate.  7. The aortic valve is tricuspid. Aortic valve regurgitation is moderate to severe.  8. Post transeptal left to right shunting only. FINDINGS  Left Ventricle: Left ventricular ejection fraction, by estimation, is 60 to 65%. The left ventricle has normal function. The left ventricular internal cavity size was normal in size. There is no left ventricular hypertrophy. Right Ventricle: The right ventricular size is normal. No increase in right ventricular wall thickness. Right ventricular systolic function is normal. Left Atrium: Left atrial size was moderately dilated. No left atrial/left atrial appendage thrombus was detected. Right Atrium: Right atrial size was moderately dilated. Pericardium: There is no evidence of pericardial effusion. Mitral Valve: Pre-Clip: Severe MR Two eccentric jets. Largest from large prolapsing A1 segment directed posteriorly. Smaller anteriorly directed jet from small flail medial P2 segment MVA 6.45 cm2 mean grdient 2 mmHg Post Clip A single NTW clip placed between A12/P2 mild grade 1 residual posteriorly directed MR Mean gradient 3 mmHg MVA 4.39 cm2. The mitral valve is  abnormal. Severe mitral valve regurgitation. There is a Nutritional therapist NTW present in the mitral position. Procedure Date: 01/23/2021. No evidence of mitral valve stenosis. MV peak gradient, 6.7 mmHg. The mean mitral valve gradient is 3.0 mmHg. Tricuspid Valve: The tricuspid valve is normal in structure. Tricuspid valve regurgitation is moderate. Aortic Valve: The aortic valve is tricuspid. Aortic valve regurgitation is moderate to severe. Pulmonic Valve: The pulmonic valve was normal in structure. Pulmonic valve regurgitation is mild. Aorta: The aortic root is normal in size and structure. IAS/Shunts: Post transeptal left to right shunting only.  LEFT VENTRICLE PLAX 2D LVOT diam:     2.30 cm LV SV:         108 LV SV Index:   57 LVOT Area:     4.15 cm  AORTIC VALVE LVOT Vmax:   106.00 cm/s LVOT Vmean:  64.600 cm/s LVOT VTI:    0.261 m MITRAL VALVE MV Area (PHT): 2.10 cm      SHUNTS MV Area VTI:   2.16 cm      Systemic VTI:  0.26 m MV Peak grad:  6.7 mmHg      Systemic Diam: 2.30 cm MV Mean grad:  3.0 mmHg MV Vmax:       1.29 m/s MV Vmean:      79.4 cm/s MR Peak grad:    92.2 mmHg MR Mean grad:    61.0 mmHg MR Vmax:         480.00 cm/s MR Vmean:        367.0 cm/s MR PISA:         1.57 cm MR PISA Eff ROA: 13 mm MR PISA Radius:  0.50 cm Jenkins Rouge MD Electronically signed by Jenkins Rouge MD Signature Date/Time: 01/23/2021/1:54:07 PM    Final    Disposition   Pt is being discharged home today in good condition.  Follow-up Plans & Appointments     Follow-up Information    Eileen Stanford, PA-C. Go on 01/30/2021.   Specialties: Cardiology, Radiology Why: @ 2:30pm, please  arrive at least 10 minutes early.  Contact information: [address] [place]  [address] [phone]              Discharge Instructions    Amb Referral to Cardiac Rehabilitation   Complete by: As directed    Referring to [place] CRP 2   Diagnosis: Valve Repair   Valve: Mitral Comment - mitra clip    After initial evaluation and assessments completed: Virtual Based Care may be provided alone or in conjunction with Phase 2 Cardiac Rehab based on patient barriers.: Yes      Discharge Medications   Allergies as of 01/24/2021   No Known Allergies     Medication List    STOP taking these medications   omeprazole 40 MG capsule Commonly known as: PRILOSEC Replaced by: pantoprazole 40 MG tablet     TAKE these medications   amLODipine 10 MG tablet Commonly known as: NORVASC Take 1 tablet (10 mg total) by mouth daily.   aspirin EC 81 MG tablet Take by mouth daily.   atorvastatin 40 MG tablet Commonly known as: LIPITOR TAKE 1 TABLET BY MOUTH EVERYDAY AT BEDTIME What changed: See the new instructions.   busPIRone 5 MG tablet Commonly known as: BUSPAR Take 5 mg by mouth 3 (three) times daily.   carboxymethylcellulose 0.5 % Soln Commonly known as: REFRESH PLUS Place 1 drop into both eyes 3 (three) times daily as needed.   cloNIDine 0.1 MG tablet Commonly known as: Catapres Take 1 tablet (0.1 mg total) by mouth 2 (two) times daily.   clopidogrel 75 MG tablet Commonly known as: PLAVIX Take 1 tablet (75 mg total) by mouth daily with breakfast.   levothyroxine 50 MCG tablet Commonly known as: SYNTHROID Take 50 mcg by mouth daily before breakfast.   nitroGLYCERIN 0.4 MG SL tablet Commonly known as: NITROSTAT Place 1 tablet (0.4 mg total) under the tongue every 5 (five) minutes as needed for chest pain.   pantoprazole 40 MG tablet Commonly known as: PROTONIX Take 1 tablet (40 mg total) by mouth daily. Replaces: omeprazole 40 MG capsule   zolpidem 5 MG tablet Commonly known as: AMBIEN Take 5 mg by mouth at bedtime.         Outstanding Labs/Studies   none  Duration of Discharge Encounter   Greater than 30 minutes including physician time.  Signed, Angelena Form, PA-C 01/24/2021, 11:02 AM  Patient seen, examined. Available data reviewed. Agree with  findings, assessment, and plan as outlined by Nell Range, PA-C.  The patient is independently interviewed and examined.  His right groin site is clear.  HEENT is normal.  Lungs are clear.  Heart is regular rate and rhythm with a 2/6 holosystolic murmur at the apex consistent with mitral regurgitation.  There is no leg edema.  The patient has done very well with transcatheter edge-to-edge mitral valve repair with improvement in his mitral regurgitation.  He is medically stable for discharge today.  I agree with recommendations outlined above.  Follow-up has been arranged.  Sherren Mocha, M.D. 01/24/2021 3:59 PM

## 2021-01-23 NOTE — Progress Notes (Signed)
  Echocardiogram Echocardiogram Transesophageal has been performed.  Ryan Mendoza M 01/23/2021, 10:24 AM

## 2021-01-23 NOTE — Anesthesia Postprocedure Evaluation (Signed)
Anesthesia Post Note  Patient: GREGOREY NABOR  Procedure(s) Performed: MITRAL VALVE REPAIR (N/A ) TRANSESOPHAGEAL ECHOCARDIOGRAM (TEE) (N/A )     Patient location during evaluation: PACU Anesthesia Type: General Level of consciousness: awake and alert Pain management: pain level controlled Vital Signs Assessment: post-procedure vital signs reviewed and stable Respiratory status: spontaneous breathing, nonlabored ventilation, respiratory function stable and patient connected to nasal cannula oxygen Cardiovascular status: blood pressure returned to baseline and stable Postop Assessment: no apparent nausea or vomiting Anesthetic complications: no   No complications documented.  Last Vitals:  Vitals:   01/23/21 1215 01/23/21 1230  BP: 139/69 128/66  Pulse: 81 80  Resp: 12 12  Temp:    SpO2: 95% 97%    Last Pain:  Vitals:   01/23/21 1215  TempSrc:   PainSc: 0-No pain                 Tiajuana Amass

## 2021-01-23 NOTE — Discharge Instructions (Signed)
Home Care Following Your MitraClip Procedure      If you have any questions or concerns you can call the structural heart office at [phone] during normal business hours 8am-4pm. If you have an urgent need after hours or on the weekend, please call [phone] to talk to the on call provider for general cardiology. If you have an emergency that requires immediate attention, please call 911.   Groin Site Care Refer to this sheet in the next few weeks. These instructions provide you with information on caring for yourself after your procedure. Your caregiver may also give you more specific instructions. Your treatment has been planned according to current medical practices, but problems sometimes occur. Call your caregiver if you have any problems or questions after your procedure. HOME CARE INSTRUCTIONS  You may shower 24 hours after the procedure. Remove the bandage (dressing) and gently wash the site with plain soap and water. Gently pat the site dry.   Do not apply powder or lotion to the site.   Do not sit in a bathtub, swimming pool, or whirlpool for 5 to 7 days.   No bending, squatting, or lifting anything over 10 pounds (4.5 kg) as directed by your caregiver.   Inspect the site at least twice daily.   Do not drive home if you are discharged the same day of the procedure. Have someone else drive you.   You may drive 72 hours after the procedure unless otherwise instructed by your caregiver.  What to expect:  Any bruising will usually fade within 1 to 2 weeks.   Blood that collects in the tissue (hematoma) may be painful to the touch. It should usually decrease in size and tenderness within 1 to 2 weeks.  SEEK IMMEDIATE MEDICAL CARE IF:  You have unusual pain at the groin site or down the affected leg.   You have redness, warmth, swelling, or pain at the groin site.   You have drainage (other than a small amount of blood on the dressing).   You have chills.   You have a  fever or persistent symptoms for more than 72 hours.   You have a fever and your symptoms suddenly get worse.   Your leg becomes pale, cool, tingly, or numb.   You have bleeding from the site. Hold pressure on the site until it subsides.    After MitraClip Checklist  Check  Test Description   Follow up appointment in 1-2 weeks  Most of our patients will see our structural heart physician assistant, Katie Faten Frieson, or your primary cardiologist within 1-2 weeks. Your incision site will be checked and you will be cleared to resume all normal activities if you are doing well.     1 month echo and follow up  You will have an echo to check on your heart valve clip and be seen back in the office by Katie Shalamar Crays PA-C.   Follow up with your primary cardiologist You will need to be seen by your primary cardiologist in the following 3-6 months after your 1 month appointment in the valve clinic. Often times your Plavix or Aspirin will be discontinued during this time, but this is decided on a case by case basis.    1 year echo and follow up You will have another echo to check on your heart valve after one year and be seen back in the office by Katie Izak Anding. This your last structural heart visit.   Bacterial endocarditis prophylaxis  You will   have to take antibiotics for the rest of your life before all dental procedures (even dental cleanings) to protect your heart valve from potential infection. Antibiotics are also required before some surgeries. Please check with your cardiologist before scheduling any surgeries. Also, please make sure to tell us if you have a penicillin allergy as you will require an alternative antibiotic.    ______________  Your Implant Identification Card Following your procedure, you will receive an Implant Identification Card, which your doctor will fill out and which you must carry with you at all times. Show your Implant Identification Card if you report to an emergency room.  This card identifies you as a patient who has had a MitraClip device implanted. If you require a magnetic resonance imaging (MRI) scan, tell your doctor or MRI technician that you have a MitraClip device implanted. Test results indicate that patients with the MitraClip device can safely undergo MRI scans under certain conditions described on the card.  

## 2021-01-23 NOTE — Anesthesia Procedure Notes (Signed)
Procedure Name: Intubation Date/Time: 01/23/2021 7:53 AM Performed by: Lance Coon, CRNA Pre-anesthesia Checklist: Patient identified, Emergency Drugs available, Suction available, Patient being monitored and Timeout performed Patient Re-evaluated:Patient Re-evaluated prior to induction Oxygen Delivery Method: Circle system utilized Preoxygenation: Pre-oxygenation with 100% oxygen Induction Type: IV induction Ventilation: Mask ventilation without difficulty Laryngoscope Size: Miller and 2 Grade View: Grade II Tube type: Oral Tube size: 7.5 mm Number of attempts: 1 Airway Equipment and Method: Stylet Placement Confirmation: ETT inserted through vocal cords under direct vision,  positive ETCO2 and breath sounds checked- equal and bilateral Secured at: 21 cm Tube secured with: Tape Dental Injury: Teeth and Oropharynx as per pre-operative assessment

## 2021-01-24 ENCOUNTER — Other Ambulatory Visit: Payer: Self-pay | Admitting: Physician Assistant

## 2021-01-24 ENCOUNTER — Encounter (HOSPITAL_COMMUNITY): Payer: Self-pay | Admitting: Cardiovascular Disease

## 2021-01-24 ENCOUNTER — Inpatient Hospital Stay (HOSPITAL_COMMUNITY): Payer: Medicare Other

## 2021-01-24 DIAGNOSIS — I5032 Chronic diastolic (congestive) heart failure: Secondary | ICD-10-CM | POA: Diagnosis not present

## 2021-01-24 DIAGNOSIS — Z9889 Other specified postprocedural states: Secondary | ICD-10-CM

## 2021-01-24 DIAGNOSIS — I340 Nonrheumatic mitral (valve) insufficiency: Secondary | ICD-10-CM

## 2021-01-24 DIAGNOSIS — Z954 Presence of other heart-valve replacement: Secondary | ICD-10-CM | POA: Diagnosis not present

## 2021-01-24 DIAGNOSIS — Z95818 Presence of other cardiac implants and grafts: Secondary | ICD-10-CM

## 2021-01-24 DIAGNOSIS — Z006 Encounter for examination for normal comparison and control in clinical research program: Secondary | ICD-10-CM | POA: Diagnosis not present

## 2021-01-24 DIAGNOSIS — Z20822 Contact with and (suspected) exposure to covid-19: Secondary | ICD-10-CM | POA: Diagnosis not present

## 2021-01-24 DIAGNOSIS — I083 Combined rheumatic disorders of mitral, aortic and tricuspid valves: Secondary | ICD-10-CM | POA: Diagnosis not present

## 2021-01-24 LAB — BASIC METABOLIC PANEL
Anion gap: 11 (ref 5–15)
BUN: 25 mg/dL — ABNORMAL HIGH (ref 8–23)
CO2: 22 mmol/L (ref 22–32)
Calcium: 8.9 mg/dL (ref 8.9–10.3)
Chloride: 105 mmol/L (ref 98–111)
Creatinine, Ser: 1.65 mg/dL — ABNORMAL HIGH (ref 0.61–1.24)
GFR, Estimated: 41 mL/min — ABNORMAL LOW (ref >60)
Glucose, Bld: 168 mg/dL — ABNORMAL HIGH (ref 70–99)
Potassium: 4 mmol/L (ref 3.5–5.1)
Sodium: 138 mmol/L (ref 135–145)

## 2021-01-24 LAB — CBC
HCT: 40.1 % (ref 39.0–52.0)
Hemoglobin: 13.1 g/dL (ref 13.0–17.0)
MCH: 28.8 pg (ref 26.0–34.0)
MCHC: 32.7 g/dL (ref 30.0–36.0)
MCV: 88.1 fL (ref 80.0–100.0)
Platelets: 197 10*3/uL (ref 150–400)
RBC: 4.55 MIL/uL (ref 4.22–5.81)
RDW: 13.9 % (ref 11.5–15.5)
WBC: 10.6 10*3/uL — ABNORMAL HIGH (ref 4.0–10.5)
nRBC: 0 % (ref 0.0–0.2)

## 2021-01-24 LAB — POCT ACTIVATED CLOTTING TIME
Activated Clotting Time: 333 seconds
Activated Clotting Time: 321 seconds

## 2021-01-24 MED ORDER — PANTOPRAZOLE SODIUM 40 MG PO TBEC: 40.0000 mg | DELAYED_RELEASE_TABLET | Freq: Every day | ORAL | 0 refills | Status: DC

## 2021-01-24 MED ORDER — CLOPIDOGREL BISULFATE 75 MG PO TABS: 75.0000 mg | ORAL_TABLET | Freq: Every day | ORAL | 0 refills | Status: DC

## 2021-01-24 NOTE — Progress Notes (Signed)
Pt provided with verbal discharge instructions. Pt wife and daughter at bedside during instructions. All questions answered. Iv's removed per order. Pt VSS at discharge. Pt belongings sent with family.  Pt d/c via wheelchair to [facility] entrance to private vehicle.

## 2021-01-24 NOTE — Progress Notes (Signed)
CARDIAC REHAB PHASE I   PRE:  Rate/Rhythm: 71 SR  BP:  Supine:   Sitting: 120/67  Standing:    SaO2: 96%RA  MODE:  Ambulation: 400 ft   POST:  Rate/Rhythm: 79 SR  BP:  Supine:   Sitting: 152/74  Standing:    SaO2: 98%RA 0926-1006 Pt walked 400 ft on RA with hand held asst. Gait steady. Tolerated well. Discussed CRP 2 and referral letter will be sent to [facility]. Encouraged pt to watch his sodium and diet given. Encouraged walking as tolerated. Pt voiced understanding.    Graylon Good, RN BSN  01/24/2021 10:03 AM

## 2021-01-27 ENCOUNTER — Telehealth: Payer: Self-pay

## 2021-01-27 ENCOUNTER — Telehealth (HOSPITAL_COMMUNITY): Payer: Self-pay

## 2021-01-27 LAB — ECHOCARDIOGRAM LIMITED
Height: 67 in
P 1/2 time: 509 msec
Weight: 2819.21 oz

## 2021-01-27 NOTE — Telephone Encounter (Signed)
Per phase I, fax cardiac rehab referral to [facility].  

## 2021-01-27 NOTE — Telephone Encounter (Signed)
  [facility]   MULTIDISCIPLINARY HEART VALVE TEAM  TOC Call  Attempted to reach pt at home number, left message for pt to call back. Attempted to reach pt's daughter at her number, left message for her to call back.

## 2021-01-28 NOTE — Telephone Encounter (Signed)
TOC Call 2nd attempt  Attempted to reach pt at home number, left message for pt to call back.

## 2021-01-30 ENCOUNTER — Ambulatory Visit: Payer: Medicare Other | Admitting: Physician Assistant

## 2021-01-30 ENCOUNTER — Other Ambulatory Visit: Payer: Self-pay

## 2021-01-30 ENCOUNTER — Encounter: Payer: Self-pay | Admitting: Physician Assistant

## 2021-01-30 VITALS — BP 124/62 | HR 64 | Ht 68.0 in | Wt 176.0 lb

## 2021-01-30 DIAGNOSIS — K219 Gastro-esophageal reflux disease without esophagitis: Secondary | ICD-10-CM | POA: Diagnosis not present

## 2021-01-30 DIAGNOSIS — R109 Unspecified abdominal pain: Secondary | ICD-10-CM | POA: Diagnosis not present

## 2021-01-30 DIAGNOSIS — I351 Nonrheumatic aortic (valve) insufficiency: Secondary | ICD-10-CM | POA: Diagnosis not present

## 2021-01-30 DIAGNOSIS — Z9889 Other specified postprocedural states: Secondary | ICD-10-CM | POA: Diagnosis not present

## 2021-01-30 DIAGNOSIS — Z95818 Presence of other cardiac implants and grafts: Secondary | ICD-10-CM

## 2021-01-30 MED ORDER — AMOXICILLIN 500 MG PO TABS: ORAL_TABLET | ORAL | 12 refills | Status: AC

## 2021-01-30 NOTE — Patient Instructions (Signed)
Medication Instructions:  1) Your provider discussed the importance of taking an antibiotic prior to all dental visits to prevent damage to the heart valves from infection. You were given a prescription for AMOXIL 2,000 mg to take one hour prior to any dental appointment.  *If you need a refill on your cardiac medications before your next appointment, please call your pharmacy*   Follow-Up: Please keep your appointments as scheduled!

## 2021-01-30 NOTE — Progress Notes (Signed)
[facility] [place]                                     Cardiology Office Note:    Date:  01/30/2021   ID:  DRAYCEN LEICHTER, DOB [birth year], MRN [id number]  PCP:  Angelina Sheriff, MD  [facility] Cardiologist: Dr. [facility] Electrophysiologist:  None   Referring MD: Angelina Sheriff, MD   CC: TOC s/p TEER   History of Present Illness:    Ryan Mendoza is a 87 y.o. male with a hx of CAD s/p PCIs, HTN, HLD, hypothyroidism, bradycardia, anxiety, moderate to severe AI and severe mitral regurgitation s/p TEER (01/23/21) who presents to clinic for follow up.   Patient's cardiac history dates back to 2017 when he reportedly presented with symptoms of chest pain and shortness of breath. He was treated at the time at [facility] where he apparently underwent PCI and stenting of the left anterior descending coronary artery and the left circumflex coronary artery. Over the past 2 years he has had worsening and progressive symptoms of exertional shortness of breath, decreased energy, fatigue, tightness across his chest, and increasing anxiety. In January 2020 he developed particularly a severe episode of substernal chest discomfort for which he was briefly hospitalized in [place], [place]. On review of the records from that admission he apparently was thought to be in atrial fibrillation on presentation but spontaneously converted to sinus rhythm. He ruled out for acute myocardial infarction. Catheterization performed at that time revealed nonobstructive coronary artery disease and the patient was told that his chest pain was likely related to ulcer disease. Over the past 2 years he has complained of worsening fatigue and exhaustion. He has had episodes of increased exertional shortness of breath with occasional resting shortness of breath. Transthoracic echocardiogram performed March 2021 revealed  normal left ventricular systolic function with severely dilated left atrium, moderate mitral regurgitation and mild to moderate aortic regurgitation. He was evaluated by Dr. Caryl Comes for concerns of possible symptomatic bradycardia, but findings have not been suggestive of symptomatic bradycardia nor chronotropic incompetence. He was seen in follow-up recently by Dr. Agustin Cree and transesophageal echocardiogram was recommended to further evaluate severity of mitral regurgitation. TEE performed October 11, 2020 revealed what was felt to be severe mitral regurgitation with partially flail A2 and P2 segments of both the anterior and posterior leaflets and multiple eccentric jets of regurgitation.There was felt to be moderate aortic insufficiency. Left ventricular function was reportedly "low normal" with ejection fraction estimated 50 to 55%.   He was evaluated by the multidisciplinary valve team and felt to have severe, symptomatic non rheumatic mitral regurgitation and underwent successful transcatheter edge-to-edge mitral valve repair using a single G4 NT W MitraClip device, placed on the lateral side of A2/P2. Mitral regurgitation reduced from 4+ preprocedure to 1+ post procedure with a residual eccentric jet along the medial aspect of P2. Post operative echo showed EF 55-60%, normally functioning MitraClip with mild residual MR with mean gradient of 3 mm hg and moderate to severe AR. He was discharged on aspirin and plavix x 3 months. Prilosec was changed to Protonix given potential drug drug interaction with Plavix.  Today he presents to clinic for follow up. Here with wife. Doing well. Has had an improvement in his symptoms with less chest  discomfort. Still struggles with gas pains, which proceeded his valve surgery. Breathing has improved some. No LE edema, orthopnea or PND. No dizziness or syncope. No blood in stool or urine. No palpitations.     Past Medical History:  Diagnosis Date  . Anxiety    . Aortic valve regurgitation   . Arthritis    hands and knees  . BPH (benign prostatic hyperplasia)   . Bradycardia 11/22/2019  . Cataract, left eye   . Claustrophobia   . Coronary artery disease   . Coronary artery disease involving native coronary artery of native heart with angina pectoris ([place]) 12/08/2018   PTCA and stenting of LAD and circumflex in 2017  Formatting of this note might be different from the original. PTCA and drug-eluting stent to LAD as well as the circumflex in June 2017  . Depression    no current meds for  . Elevated cholesterol   . Essential hypertension 06/19/2019  . Gallstones   . GERD (gastroesophageal reflux disease)   . History of kidney stones   . Hyperparathyroidism, primary ([place]) 09/27/2017  . Hypertension   . Hypothyroidism   . Mitral regurgitation 12/08/2018   Mild to moderate by echo from August 2019  . Myocardial infarction ([place])    SILENT  . Obstructive sleep apnea 12/08/2018  . Primary hyperparathyroidism ([place])   . S/P mitral valve clip implantation 01/23/2021   s/p TEER using a single G4 NT W MitraClip device, placed on the lateral side of A2/P2 with Dr. Burt Knack  . 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 Status post coronary artery stent placement 12/21/2017   Formatting of this note might be different from the original. LAD and Cx 2017  . Tinnitus    both ears all the time    Past Surgical History:  Procedure Laterality Date  . BUBBLE STUDY  10/11/2020   Procedure: BUBBLE STUDY;  Surgeon: Fay Records, MD;  Location: [place];  Service: Cardiovascular;;  . CATARACT EXTRACTION W/PHACO Left 04/12/2018   Procedure: CATARACT EXTRACTION PHACO AND INTRAOCULAR LENS PLACEMENT ([place]);  Surgeon: Birder Robson, MD;  Location: [facility];  Service: Ophthalmology;  Laterality: Left;  [place] 00:39.5 AP% 16.7 CDE 6.62 Fluid Pack Lot # [id number]  . CORONARY ANGIOPLASTY     2017  . EYE SURGERY Right    ioc  for cataract  . MITRAL VALVE REPAIR N/A 01/23/2021   Procedure: MITRAL VALVE REPAIR;  Surgeon: Sherren Mocha, MD;  Location: [facility];  Service: Cardiovascular;  Laterality: N/A;  . PARATHYROIDECTOMY Right 09/28/2017   Procedure: RIGHT INFERIOR PARATHYROIDECTOMY;  Surgeon: Armandina Gemma, MD;  Location: [facility];  Service: General;  Laterality: Right;  . right eye  plug for tear duct surgery    . RIGHT/LEFT HEART CATH AND CORONARY ANGIOGRAPHY N/A 01/10/2021   Procedure: RIGHT/LEFT HEART CATH AND CORONARY ANGIOGRAPHY;  Surgeon: Sherren Mocha, MD;  Location: [facility];  Service: Cardiovascular;  Laterality: N/A;  . stents to heart x 2  06/2016  . TEE WITHOUT CARDIOVERSION N/A 10/11/2020   Procedure: TRANSESOPHAGEAL ECHOCARDIOGRAM (TEE);  Surgeon: Fay Records, MD;  Location: [facility];  Service: Cardiovascular;  Laterality: N/A;  . TEE WITHOUT CARDIOVERSION N/A 01/23/2021   Procedure: TRANSESOPHAGEAL ECHOCARDIOGRAM (TEE);  Surgeon: Sherren Mocha, MD;  Location: [facility];  Service: Cardiovascular;  Laterality: N/A;  . TONSILLECTOMY     age 52    Current  Medications: Current Meds  Medication Sig  . amLODipine (NORVASC) 10 MG tablet Take 1 tablet (10 mg total) by mouth daily.  Marland Kitchen amoxicillin (AMOXIL) 500 MG tablet Take 4 tablets (2,000 mg) one hour prior to all dental visits.  Marland Kitchen aspirin EC 81 MG tablet Take by mouth daily.  Marland Kitchen atorvastatin (LIPITOR) 40 MG tablet TAKE 1 TABLET BY MOUTH EVERYDAY AT BEDTIME (Patient taking differently: Take 40 mg by mouth at bedtime.)  . busPIRone (BUSPAR) 5 MG tablet Take 5 mg by mouth 3 (three) times daily.  . carboxymethylcellulose (REFRESH PLUS) 0.5 % SOLN Place 1 drop into both eyes 3 (three) times daily as needed.  . cloNIDine (CATAPRES) 0.1 MG tablet Take 1 tablet (0.1 mg total) by mouth 2 (two) times daily.  . clopidogrel (PLAVIX) 75 MG tablet Take 1 tablet (75 mg total) by mouth daily with breakfast.  . levothyroxine  (SYNTHROID) 50 MCG tablet Take 50 mcg by mouth daily before breakfast.   . nitroGLYCERIN (NITROSTAT) 0.4 MG SL tablet Place 1 tablet (0.4 mg total) under the tongue every 5 (five) minutes as needed for chest pain.  . pantoprazole (PROTONIX) 40 MG tablet Take 1 tablet (40 mg total) by mouth daily.  Marland Kitchen zolpidem (AMBIEN) 5 MG tablet Take 5 mg by mouth at bedtime.     Allergies:   Patient has no known allergies.   Social History   Socioeconomic History  . Marital status: Married    Spouse name: Not on file  . Number of children: Not on file  . Years of education: Not on file  . Highest education level: Not on file  Occupational History  . Not on file  Tobacco Use  . Smoking status: Former Smoker    Packs/day: 1.00    Years: 10.00    Pack years: 10.00    Types: Cigarettes  . Smokeless tobacco: Never Used  . Tobacco comment: quit 1974  Vaping Use  . Vaping Use: Never used  Substance and Sexual Activity  . Alcohol use: No  . Drug use: No  . Sexual activity: Not on file  Other Topics Concern  . Not on file  Social History Narrative  . Not on file   Social Determinants of Health   Financial Resource Strain: Not on file  Food Insecurity: Not on file  Transportation Needs: Not on file  Physical Activity: Not on file  Stress: Not on file  Social Connections: Not on file     Family History: The patient's family history includes Heart attack in his father and mother; Heart disease in his father and mother.  ROS:   Please see the history of present illness.    All other systems reviewed and are negative.  EKGs/Labs/Other Studies Reviewed:    The following studies were reviewed today:  01/23/2021 MITRAL VALVE REPAIR  Conclusion Successful transcatheter edge-to-edge mitral valve repair using a single G4 NT W MitraClip device, placed on the lateral side of A2/P2. Mitral regurgitation reduced from 4+ preprocedure to 1+ post procedure with a residual eccentric jet along the  medial aspect of P2.  Recommend: Aspirin and clopidogrel x3 months Limited echo tomorrow morning  _________________________   Echo 01/24/21: IMPRESSIONS  1. Left ventricular ejection fraction, by estimation, is 55 to 60%. The  left ventricle has normal function. The left ventricle has no regional  wall motion abnormalities. The left ventricular internal cavity size was  mildly dilated. Left ventricular diastolic parameters are indeterminate.  2. Right ventricular systolic function  is normal. The right ventricular  size is normal. There is normal pulmonary artery systolic pressure.  3. Left atrial size was moderately dilated.  4. The septum was no well visualized could not identify residual ASD from  trans septal with left to right shunting seen by TEE day before.  5. Right atrial size was moderately dilated.  6. Post MV clip with single NTW device between A12/P2. Stable appearing  with dual diastolic inflows Mean gradient 3 mmhg no MVA calculated.  Residual anteriorly directed jet from the flail P3 segment not well  characterized with some splay artifact. The  mitral valve has been repaired/replaced. Mild mitral valve regurgitation.  No evidence of mitral stenosis. There is a Nutritional therapist NTW valve present in  the mitral position. Procedure Date: 01/23/2021.  7. The aortic valve is abnormal. Aortic valve regurgitation is moderate  to severe. Mild to moderate aortic valve sclerosis/calcification is  present, without any evidence of aortic stenosis.  8. The inferior vena cava is normal in size with greater than 50%  respiratory variability, suggesting right atrial pressure of 3 mmHg.    EKG:  EKG is NOT ordered today.   Recent Labs: 08/06/2020: TSH 7.300 01/21/2021: ALT 22 01/24/2021: BUN 25; Creatinine, Ser 1.65; Hemoglobin 13.1; Platelets 197; Potassium 4.0; Sodium 138  Recent Lipid Panel No results found for: CHOL, TRIG, HDL, CHOLHDL, VLDL, LDLCALC, LDLDIRECT   Risk  Assessment/Calculations:       Physical Exam:    VS:  BP 124/62   Pulse 64   Ht 5\' 8"  (1.727 m)   Wt 176 lb (79.8 kg)   SpO2 97%   BMI 26.76 kg/m     Wt Readings from Last 3 Encounters:  01/30/21 176 lb (79.8 kg)  01/23/21 176 lb 3.2 oz (79.9 kg)  01/21/21 176 lb 3.2 oz (79.9 kg)     GEN:  Well nourished, well developed in no acute distress HEENT: Normal NECK: No JVD; No carotid bruits LYMPHATICS: No lymphadenopathy CARDIAC: RRR, 2/6 diastolic murmur. No rubs, gallops RESPIRATORY:  Clear to auscultation without rales, wheezing or rhonchi  ABDOMEN: Soft, non-tender, non-distended MUSCULOSKELETAL:  No edema; No deformity  SKIN: Warm and dry.  Groin site clear without hematoma but diffuse ecchymosis down right thigh. NEUROLOGIC:  Alert and oriented x 3 PSYCHIATRIC:  Normal affect   ASSESSMENT:    1. S/P mitral valve clip implantation   2. Nonrheumatic aortic valve insufficiency   3. Gastroesophageal reflux disease, unspecified whether esophagitis present   4. Abdominal discomfort    PLAN:    In order of problems listed above:  Severe MR s/p TEER: doing well with a clinical improvement. Groin site healing well. Continue on aspirin and Plavix x 3 months and then discontinue plavix. SBE prophylaxis discussed; I have RX'd amoxicillin. I will see him back in 1 month with echo.   Aortic valve insufficiency: moderate to severe on last echo. Will follow on 1 month echo. Doing well clinically.   GERD: Prilosec was changed to Protonix given potential drug drug interaction with Plavix. Prilosec can be resumed when he finishes his 3 month course of plavix   Abdominal discomfort: pt has continued abdominal discomfort and gas, which proceeded surgery. He is followed by Dr. Lyda Jester in [place]. He plans to do a scope in the near future. I told him he is cleared for this if it can be done on plavix. If plavix needs to be held, we would just ask that it be scheduled once  he has  completed his plavix course (3 months.)  Medication Adjustments/Labs and Tests Ordered: Current medicines are reviewed at length with the patient today.  Concerns regarding medicines are outlined above.  No orders of the defined types were placed in this encounter.  Meds ordered this encounter  Medications  . amoxicillin (AMOXIL) 500 MG tablet    Sig: Take 4 tablets (2,000 mg) one hour prior to all dental visits.    Dispense:  8 tablet    Refill:  12    Patient Instructions  Medication Instructions:  1) Your provider discussed the importance of taking an antibiotic prior to all dental visits to prevent damage to the heart valves from infection. You were given a prescription for AMOXIL 2,000 mg to take one hour prior to any dental appointment.  *If you need a refill on your cardiac medications before your next appointment, please call your pharmacy*   Follow-Up: Please keep your appointments as scheduled!    Signed, Angelena Form, PA-C  01/30/2021 3:05 PM    [facility]

## 2021-02-04 DIAGNOSIS — Z6824 Body mass index (BMI) 24.0-24.9, adult: Secondary | ICD-10-CM | POA: Diagnosis not present

## 2021-02-04 DIAGNOSIS — I259 Chronic ischemic heart disease, unspecified: Secondary | ICD-10-CM | POA: Diagnosis not present

## 2021-02-04 DIAGNOSIS — I10 Essential (primary) hypertension: Secondary | ICD-10-CM | POA: Diagnosis not present

## 2021-02-13 DIAGNOSIS — L570 Actinic keratosis: Secondary | ICD-10-CM | POA: Diagnosis not present

## 2021-02-13 DIAGNOSIS — L821 Other seborrheic keratosis: Secondary | ICD-10-CM | POA: Diagnosis not present

## 2021-02-17 ENCOUNTER — Other Ambulatory Visit: Payer: Self-pay | Admitting: *Deleted

## 2021-02-17 DIAGNOSIS — K219 Gastro-esophageal reflux disease without esophagitis: Secondary | ICD-10-CM | POA: Diagnosis not present

## 2021-02-17 MED ORDER — NITROGLYCERIN 0.4 MG SL SUBL: 0.4000 mg | SUBLINGUAL_TABLET | SUBLINGUAL | 3 refills | Status: DC | PRN

## 2021-02-19 DIAGNOSIS — R634 Abnormal weight loss: Secondary | ICD-10-CM | POA: Diagnosis not present

## 2021-02-19 DIAGNOSIS — K8689 Other specified diseases of pancreas: Secondary | ICD-10-CM | POA: Diagnosis not present

## 2021-02-19 DIAGNOSIS — N200 Calculus of kidney: Secondary | ICD-10-CM | POA: Diagnosis not present

## 2021-02-19 DIAGNOSIS — R1013 Epigastric pain: Secondary | ICD-10-CM | POA: Diagnosis not present

## 2021-02-19 DIAGNOSIS — H26492 Other secondary cataract, left eye: Secondary | ICD-10-CM | POA: Diagnosis not present

## 2021-02-20 ENCOUNTER — Other Ambulatory Visit: Payer: Self-pay

## 2021-02-20 ENCOUNTER — Ambulatory Visit: Payer: Medicare Other | Admitting: Physician Assistant

## 2021-02-20 ENCOUNTER — Ambulatory Visit (HOSPITAL_COMMUNITY): Payer: Medicare Other | Attending: Cardiology

## 2021-02-20 ENCOUNTER — Telehealth: Payer: Self-pay

## 2021-02-20 ENCOUNTER — Encounter: Payer: Self-pay | Admitting: Physician Assistant

## 2021-02-20 VITALS — BP 132/78 | HR 55 | Ht 68.0 in | Wt 166.0 lb

## 2021-02-20 DIAGNOSIS — R109 Unspecified abdominal pain: Secondary | ICD-10-CM | POA: Diagnosis not present

## 2021-02-20 DIAGNOSIS — I351 Nonrheumatic aortic (valve) insufficiency: Secondary | ICD-10-CM | POA: Diagnosis not present

## 2021-02-20 DIAGNOSIS — K219 Gastro-esophageal reflux disease without esophagitis: Secondary | ICD-10-CM

## 2021-02-20 DIAGNOSIS — Z9889 Other specified postprocedural states: Secondary | ICD-10-CM

## 2021-02-20 DIAGNOSIS — Z95818 Presence of other cardiac implants and grafts: Secondary | ICD-10-CM

## 2021-02-20 DIAGNOSIS — I340 Nonrheumatic mitral (valve) insufficiency: Secondary | ICD-10-CM | POA: Diagnosis not present

## 2021-02-20 NOTE — Telephone Encounter (Signed)
The patient was in the office today for 1 month Clip visit. He requests to switch from Dr. Agustin Cree to Dr. Audie Box as he would like to follow with a doctor on the Structural Team.  Per protocol, will route to Drs. Agustin Cree and O'Neal for agreement.  Will call patient after and schedule appropriate 4 month visit.

## 2021-02-20 NOTE — Telephone Encounter (Signed)
Fine with me.   [place] T. Audie Box, MD, [place]  [address] [place], [place] [address] [phone]  6:22 PM

## 2021-02-20 NOTE — Progress Notes (Signed)
HEART AND [place]                                     Cardiology Office Note:    Date:  02/21/2021   ID:  Ryan Mendoza, DOB 12/04/1934, MRN [id number]  PCP:  Angelina Sheriff, MD  [facility] Cardiologist: Dr. Agustin Cree  --> Dr. Shiela [facility] Electrophysiologist:  None   Referring MD: Angelina Sheriff, MD   CC: 1 month s/p TEER   History of Present Illness:    Ryan Mendoza is a 87 y.o. male with a hx of CAD s/p PCIs, HTN, HLD, hypothyroidism, bradycardia, anxiety, moderate to severe AI and severe mitral regurgitation s/p TEER (01/23/21) who presents to clinic for follow up.   Patient's cardiac history dates back to 2017 when he reportedly presented with symptoms of chest pain and shortness of breath. He was treated at the time at [facility] where he apparently underwent PCI and stenting of the left anterior descending coronary artery and the left circumflex coronary artery. Over the past 2 years he has had worsening and progressive symptoms of exertional shortness of breath, decreased energy, fatigue, tightness across his chest, and increasing anxiety. In January 2020 he developed particularly a severe episode of substernal chest discomfort for which he was briefly hospitalized in [place], [place]. On review of the records from that admission he apparently was thought to be in atrial fibrillation on presentation but spontaneously converted to sinus rhythm. He ruled out for acute myocardial infarction. Catheterization performed at that time revealed nonobstructive coronary artery disease and the patient was told that his chest pain was likely related to ulcer disease. Over the past 2 years he has complained of worsening fatigue and exhaustion. He has had episodes of increased exertional shortness of breath with occasional resting shortness of breath. Transthoracic echocardiogram performed March  2021 revealed normal left ventricular systolic function with severely dilated left atrium, moderate mitral regurgitation and mild to moderate aortic regurgitation. He was evaluated by Dr. Caryl Comes for concerns of possible symptomatic bradycardia, but findings have not been suggestive of symptomatic bradycardia nor chronotropic incompetence. He was seen in follow-up recently by Dr. Agustin Cree and transesophageal echocardiogram was recommended to further evaluate severity of mitral regurgitation. TEE performed October 11, 2020 revealed what was felt to be severe mitral regurgitation with partially flail A2 and P2 segments of both the anterior and posterior leaflets and multiple eccentric jets of regurgitation.There was felt to be moderate aortic insufficiency. Left ventricular function was reportedly "low normal" with ejection fraction estimated 50 to 55%.   He was evaluated by the multidisciplinary valve team and felt to have severe, symptomatic non rheumatic mitral regurgitation and underwent successful transcatheter edge-to-edge mitral valve repair using a single G4 NT W MitraClip device, placed on the lateral side of A2/P2. Mitral regurgitation reduced from 4+ preprocedure to 1+ post procedure with a residual eccentric jet along the medial aspect of P2. Post operative echo showed EF 55-60%, normally functioning MitraClip with mild residual MR with mean gradient of 3 mm hg and moderate to severe AR. He was discharged on aspirin and plavix x 3 months. Prilosec was changed to Protonix given potential drug drug interaction with Plavix.  Today he presents to clinic for follow up. Here with wife and daughter. No CP or SOB. No LE edema,  orthopnea or PND. No dizziness or syncope. No blood in stool or urine. No palpitations. Doing great.     Past Medical History:  Diagnosis Date  . Anxiety   . Aortic valve regurgitation   . Arthritis    hands and knees  . BPH (benign prostatic hyperplasia)   . Bradycardia  11/22/2019  . Cataract, left eye   . Claustrophobia   . Coronary artery disease   . Coronary artery disease involving native coronary artery of native heart with angina pectoris ([place]) 12/08/2018   PTCA and stenting of LAD and circumflex in 2017  Formatting of this note might be different from the original. PTCA and drug-eluting stent to LAD as well as the circumflex in June 2017  . Depression    no current meds for  . Elevated cholesterol   . Essential hypertension 06/19/2019  . Gallstones   . GERD (gastroesophageal reflux disease)   . History of kidney stones   . Hyperparathyroidism, primary ([place]) 09/27/2017  . Hypertension   . Hypothyroidism   . Mitral regurgitation 12/08/2018   Mild to moderate by echo from August 2019  . Myocardial infarction ([place])    SILENT  . Obstructive sleep apnea 12/08/2018  . Primary hyperparathyroidism ([place])   . S/P mitral valve clip implantation 01/23/2021   s/p TEER using a single G4 NT W MitraClip device, placed on the lateral side of A2/P2 with Dr. Burt Knack  . 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 Status post coronary artery stent placement 12/21/2017   Formatting of this note might be different from the original. LAD and Cx 2017  . Tinnitus    both ears all the time    Past Surgical History:  Procedure Laterality Date  . BUBBLE STUDY  10/11/2020   Procedure: BUBBLE STUDY;  Surgeon: Fay Records, MD;  Location: [facility];  Service: Cardiovascular;;  . CATARACT EXTRACTION W/PHACO Left 04/12/2018   Procedure: CATARACT EXTRACTION PHACO AND INTRAOCULAR LENS PLACEMENT ([place]);  Surgeon: Birder Robson, MD;  Location: [facility];  Service: Ophthalmology;  Laterality: Left;  [place] 00:39.5 AP% 16.7 CDE 6.62 Fluid Pack Lot # [id number]  . CORONARY ANGIOPLASTY     2017  . EYE SURGERY Right    ioc for cataract  . MITRAL VALVE REPAIR N/A 01/23/2021   Procedure: MITRAL VALVE REPAIR;  Surgeon: Sherren Mocha, MD;   Location: [facility];  Service: Cardiovascular;  Laterality: N/A;  . PARATHYROIDECTOMY Right 09/28/2017   Procedure: RIGHT INFERIOR PARATHYROIDECTOMY;  Surgeon: Armandina Gemma, MD;  Location: [facility];  Service: General;  Laterality: Right;  . right eye  plug for tear duct surgery    . RIGHT/LEFT HEART CATH AND CORONARY ANGIOGRAPHY N/A 01/10/2021   Procedure: RIGHT/LEFT HEART CATH AND CORONARY ANGIOGRAPHY;  Surgeon: Sherren Mocha, MD;  Location: [place];  Service: Cardiovascular;  Laterality: N/A;  . stents to heart x 2  06/2016  . TEE WITHOUT CARDIOVERSION N/A 10/11/2020   Procedure: TRANSESOPHAGEAL ECHOCARDIOGRAM (TEE);  Surgeon: Fay Records, MD;  Location: [facility];  Service: Cardiovascular;  Laterality: N/A;  . TEE WITHOUT CARDIOVERSION N/A 01/23/2021   Procedure: TRANSESOPHAGEAL ECHOCARDIOGRAM (TEE);  Surgeon: Sherren Mocha, MD;  Location: [facility];  Service: Cardiovascular;  Laterality: N/A;  . TONSILLECTOMY     age 17    Current Medications: Current Meds  Medication Sig  . amLODipine (NORVASC) 10 MG tablet Take 1 tablet (  10 mg total) by mouth daily.  Marland Kitchen amoxicillin (AMOXIL) 500 MG tablet Take 4 tablets (2,000 mg) one hour prior to all dental visits.  Marland Kitchen aspirin EC 81 MG tablet Take by mouth daily.  Marland Kitchen atorvastatin (LIPITOR) 40 MG tablet TAKE 1 TABLET BY MOUTH EVERYDAY AT BEDTIME  . busPIRone (BUSPAR) 5 MG tablet Take 5 mg by mouth 3 (three) times daily.  . carboxymethylcellulose (REFRESH PLUS) 0.5 % SOLN Place 1 drop into both eyes 3 (three) times daily as needed.  . cloNIDine (CATAPRES) 0.1 MG tablet Take 1 tablet (0.1 mg total) by mouth 2 (two) times daily.  . clopidogrel (PLAVIX) 75 MG tablet Take 1 tablet (75 mg total) by mouth daily with breakfast.  . levothyroxine (SYNTHROID) 50 MCG tablet Take 50 mcg by mouth daily before breakfast.   . nitroGLYCERIN (NITROSTAT) 0.4 MG SL tablet Place 1 tablet (0.4 mg total) under the tongue every 5 (five) minutes  as needed for chest pain.  . pantoprazole (PROTONIX) 40 MG tablet Take 1 tablet (40 mg total) by mouth daily.  Marland Kitchen zolpidem (AMBIEN) 5 MG tablet Take 5 mg by mouth at bedtime.     Allergies:   Patient has no known allergies.   Social History   Socioeconomic History  . Marital status: Married    Spouse name: Not on file  . Number of children: Not on file  . Years of education: Not on file  . Highest education level: Not on file  Occupational History  . Not on file  Tobacco Use  . Smoking status: Former Smoker    Packs/day: 1.00    Years: 10.00    Pack years: 10.00    Types: Cigarettes  . Smokeless tobacco: Never Used  . Tobacco comment: quit 1974  Vaping Use  . Vaping Use: Never used  Substance and Sexual Activity  . Alcohol use: No  . Drug use: No  . Sexual activity: Not on file  Other Topics Concern  . Not on file  Social History Narrative  . Not on file   Social Determinants of Health   Financial Resource Strain: Not on file  Food Insecurity: Not on file  Transportation Needs: Not on file  Physical Activity: Not on file  Stress: Not on file  Social Connections: Not on file     Family History: The patient's family history includes Heart attack in his father and mother; Heart disease in his father and mother.  ROS:   Please see the history of present illness.    All other systems reviewed and are negative.  EKGs/Labs/Other Studies Reviewed:    The following studies were reviewed today:  01/23/2021 MITRAL VALVE REPAIR  Conclusion Successful transcatheter edge-to-edge mitral valve repair using a single G4 NT W MitraClip device, placed on the lateral side of A2/P2. Mitral regurgitation reduced from 4+ preprocedure to 1+ post procedure with a residual eccentric jet along the medial aspect of P2.  Recommend: Aspirin and clopidogrel x3 months Limited echo tomorrow morning  _________________________   Echo 01/24/21: IMPRESSIONS  1. Left ventricular  ejection fraction, by estimation, is 55 to 60%. The  left ventricle has normal function. The left ventricle has no regional  wall motion abnormalities. The left ventricular internal cavity size was  mildly dilated. Left ventricular diastolic parameters are indeterminate.  2. Right ventricular systolic function is normal. The right ventricular  size is normal. There is normal pulmonary artery systolic pressure.  3. Left atrial size was moderately dilated.  4.  The septum was no well visualized could not identify residual ASD from  trans septal with left to right shunting seen by TEE day before.  5. Right atrial size was moderately dilated.  6. Post MV clip with single NTW device between A12/P2. Stable appearing  with dual diastolic inflows Mean gradient 3 mmhg no MVA calculated.  Residual anteriorly directed jet from the flail P3 segment not well  characterized with some splay artifact. The  mitral valve has been repaired/replaced. Mild mitral valve regurgitation.  No evidence of mitral stenosis. There is a Nutritional therapist NTW valve present in  the mitral position. Procedure Date: 01/23/2021.  7. The aortic valve is abnormal. Aortic valve regurgitation is moderate  to severe. Mild to moderate aortic valve sclerosis/calcification is  present, without any evidence of aortic stenosis.  8. The inferior vena cava is normal in size with greater than 50%  respiratory variability, suggesting right atrial pressure of 3 mmHg.   ______________________  Echo 02/20/2021 IMPRESSIONS  1. Left ventricular ejection fraction, by estimation, is 55 to 60%. The left ventricle has normal function. The left ventricle has no regional wall motion abnormalities. There is mild concentric left ventricular hypertrophy. Left ventricular diastolic  parameters are consistent with Grade I diastolic dysfunction (impaired relaxation). Elevated left atrial pressure.  2. Right ventricular systolic function is normal. The  right ventricular size is normal. There is normal pulmonary artery systolic pressure. The estimated right ventricular systolic pressure is 63.8 mmHg.  3. Left atrial size was mildly dilated.  4. The mitral valve has been repaired/replaced. Mild to moderate mitral valve regurgitation. No evidence of mitral stenosis. The mean mitral valve gradient is 2.0 mmHg. There is a Mitra-Clip present in the mitral position. Procedure Date: 01/23/2021.  5. The aortic valve is normal in structure. Aortic valve regurgitation is mild to moderate. Mild to moderate aortic valve sclerosis/calcification is present, without any evidence of aortic stenosis.  6. There is borderline dilatation of the ascending aorta, measuring 40 mm.  7. The inferior vena cava is normal in size with greater than 50% respiratory variability, suggesting right atrial pressure of 3 mmHg.  Comparison(s): No significant change from prior study.    EKG:  EKG is NOT ordered today.   Recent Labs: 08/06/2020: TSH 7.300 01/21/2021: ALT 22 01/24/2021: BUN 25; Creatinine, Ser 1.65; Hemoglobin 13.1; Platelets 197; Potassium 4.0; Sodium 138  Recent Lipid Panel No results found for: CHOL, TRIG, HDL, CHOLHDL, VLDL, LDLCALC, LDLDIRECT   Risk Assessment/Calculations:       Physical Exam:    VS:  BP 132/78   Pulse (!) 55   Ht 5\' 8"  (1.727 m)   Wt 166 lb (75.3 kg)   SpO2 98%   BMI 25.24 kg/m     Wt Readings from Last 3 Encounters:  02/20/21 166 lb (75.3 kg)  01/30/21 176 lb (79.8 kg)  01/23/21 176 lb 3.2 oz (79.9 kg)     GEN:  Well nourished, well developed in no acute distress HEENT: Normal NECK: No JVD; No carotid bruits LYMPHATICS: No lymphadenopathy CARDIAC: RRR, 2/6 diastolic murmur @ RUSB and 2/6 holosystolic murmur at apex. No rubs, gallops RESPIRATORY:  Clear to auscultation without rales, wheezing or rhonchi  ABDOMEN: Soft, non-tender, non-distended MUSCULOSKELETAL:  No edema; No deformity  SKIN: Warm and dry.    NEUROLOGIC:  Alert and oriented x 3 PSYCHIATRIC:  Normal affect   ASSESSMENT:    1. S/P mitral valve clip implantation   2. Nonrheumatic aortic valve insufficiency  3. Gastroesophageal reflux disease, unspecified whether esophagitis present   4. Abdominal discomfort    PLAN:    In order of problems listed above:  Severe MR s/p TEER: echo today shows EF 55% , normally functioning TEER with mild-moderate eccentric MR and mean gradient of 2 mm hg. He has NYHA class I symptoms. Continue on aspirin and Plavix x 3 months and then discontinue plavix. SBE prophylaxis discussed; he has amoxicillin. I will see him back in 1 year with an echo.   Aortic valve insufficiency: mild to moderate AI by echo today. Continue to monitor.   GERD: Prilosec was changed to Protonix given potential drug drug interaction with Plavix. Prilosec can be resumed when he finishes his 3 month course of plavix   Abdominal discomfort: pt has continued abdominal discomfort and gas, which proceeded surgery. He is followed by Dr. Lyda Jester in [place]. He plans to do a scope in the near future. I told him he is cleared for this if it can be done on plavix. If plavix needs to be held, we would just ask that it be scheduled once he has completed his plavix course (3 months.)  Medication Adjustments/Labs and Tests Ordered: Current medicines are reviewed at length with the patient today.  Concerns regarding medicines are outlined above.  No orders of the defined types were placed in this encounter.  No orders of the defined types were placed in this encounter.   Patient Instructions  Medication Instructions:  1) You may STOP PLAVIX 04/22/2021 *If you need a refill on your cardiac medications before your next appointment, please call your pharmacy*   Follow-Up: You will be called to arrange your 4 month visit.    Signed, Angelena Form, PA-C  02/21/2021 12:32 PM    [facility]

## 2021-02-20 NOTE — Patient Instructions (Signed)
Medication Instructions:  1) You may STOP PLAVIX 04/22/2021 *If you need a refill on your cardiac medications before your next appointment, please call your pharmacy*   Follow-Up: You will be called to arrange your 4 month visit.

## 2021-02-21 LAB — ECHOCARDIOGRAM COMPLETE
Area-P 1/2: 1.62 cm2
Height: 68 in
MV M vel: 2.51 m/s
MV Peak grad: 25.2 mmHg
P 1/2 time: 483 msec
S' Lateral: 3.6 cm
Weight: 2656 oz

## 2021-02-22 NOTE — Telephone Encounter (Signed)
Fine with me

## 2021-02-24 NOTE — Telephone Encounter (Signed)
Scheduled patient 06/20/21 with Dr. Audie Box to reestablish care. He was grateful for call and agrees with plan.

## 2021-03-03 DIAGNOSIS — N451 Epididymitis: Secondary | ICD-10-CM | POA: Diagnosis not present

## 2021-03-03 DIAGNOSIS — N189 Chronic kidney disease, unspecified: Secondary | ICD-10-CM | POA: Diagnosis not present

## 2021-03-10 DIAGNOSIS — R0789 Other chest pain: Secondary | ICD-10-CM | POA: Diagnosis not present

## 2021-03-10 DIAGNOSIS — G4700 Insomnia, unspecified: Secondary | ICD-10-CM | POA: Diagnosis not present

## 2021-03-11 DIAGNOSIS — I129 Hypertensive chronic kidney disease with stage 1 through stage 4 chronic kidney disease, or unspecified chronic kidney disease: Secondary | ICD-10-CM | POA: Diagnosis not present

## 2021-03-11 DIAGNOSIS — N1832 Chronic kidney disease, stage 3b: Secondary | ICD-10-CM | POA: Diagnosis not present

## 2021-03-15 ENCOUNTER — Other Ambulatory Visit: Payer: Self-pay | Admitting: Cardiology

## 2021-03-17 DIAGNOSIS — K219 Gastro-esophageal reflux disease without esophagitis: Secondary | ICD-10-CM | POA: Diagnosis not present

## 2021-03-17 NOTE — Telephone Encounter (Signed)
Amlodipine approved and sent 

## 2021-03-24 DIAGNOSIS — N1832 Chronic kidney disease, stage 3b: Secondary | ICD-10-CM | POA: Diagnosis not present

## 2021-03-24 DIAGNOSIS — N200 Calculus of kidney: Secondary | ICD-10-CM | POA: Diagnosis not present

## 2021-03-24 DIAGNOSIS — E210 Primary hyperparathyroidism: Secondary | ICD-10-CM | POA: Diagnosis not present

## 2021-03-24 DIAGNOSIS — I2510 Atherosclerotic heart disease of native coronary artery without angina pectoris: Secondary | ICD-10-CM | POA: Diagnosis not present

## 2021-03-24 DIAGNOSIS — I129 Hypertensive chronic kidney disease with stage 1 through stage 4 chronic kidney disease, or unspecified chronic kidney disease: Secondary | ICD-10-CM | POA: Diagnosis not present

## 2021-04-04 DIAGNOSIS — N281 Cyst of kidney, acquired: Secondary | ICD-10-CM | POA: Diagnosis not present

## 2021-04-04 DIAGNOSIS — D4101 Neoplasm of uncertain behavior of right kidney: Secondary | ICD-10-CM | POA: Diagnosis not present

## 2021-04-16 DIAGNOSIS — N281 Cyst of kidney, acquired: Secondary | ICD-10-CM | POA: Diagnosis not present

## 2021-04-16 DIAGNOSIS — D734 Cyst of spleen: Secondary | ICD-10-CM | POA: Diagnosis not present

## 2021-04-16 DIAGNOSIS — D7389 Other diseases of spleen: Secondary | ICD-10-CM | POA: Diagnosis not present

## 2021-04-16 DIAGNOSIS — K862 Cyst of pancreas: Secondary | ICD-10-CM | POA: Diagnosis not present

## 2021-04-21 ENCOUNTER — Other Ambulatory Visit: Payer: Self-pay | Admitting: Physician Assistant

## 2021-04-22 DIAGNOSIS — N1832 Chronic kidney disease, stage 3b: Secondary | ICD-10-CM | POA: Diagnosis not present

## 2021-04-22 DIAGNOSIS — I25119 Atherosclerotic heart disease of native coronary artery with unspecified angina pectoris: Secondary | ICD-10-CM | POA: Diagnosis not present

## 2021-04-22 DIAGNOSIS — N2581 Secondary hyperparathyroidism of renal origin: Secondary | ICD-10-CM | POA: Diagnosis not present

## 2021-04-22 DIAGNOSIS — E039 Hypothyroidism, unspecified: Secondary | ICD-10-CM | POA: Diagnosis not present

## 2021-04-22 DIAGNOSIS — E559 Vitamin D deficiency, unspecified: Secondary | ICD-10-CM | POA: Diagnosis not present

## 2021-04-22 DIAGNOSIS — E210 Primary hyperparathyroidism: Secondary | ICD-10-CM | POA: Diagnosis not present

## 2021-04-22 DIAGNOSIS — I129 Hypertensive chronic kidney disease with stage 1 through stage 4 chronic kidney disease, or unspecified chronic kidney disease: Secondary | ICD-10-CM | POA: Diagnosis not present

## 2021-04-24 DIAGNOSIS — N281 Cyst of kidney, acquired: Secondary | ICD-10-CM | POA: Diagnosis not present

## 2021-06-20 ENCOUNTER — Encounter: Payer: Self-pay | Admitting: Cardiovascular Disease

## 2021-06-20 ENCOUNTER — Ambulatory Visit: Payer: Medicare Other | Admitting: Cardiovascular Disease

## 2021-06-20 ENCOUNTER — Other Ambulatory Visit: Payer: Self-pay

## 2021-06-20 ENCOUNTER — Ambulatory Visit (INDEPENDENT_AMBULATORY_CARE_PROVIDER_SITE_OTHER): Payer: Medicare Other

## 2021-06-20 VITALS — BP 134/72 | HR 64 | Ht 67.0 in | Wt 179.6 lb

## 2021-06-20 DIAGNOSIS — R001 Bradycardia, unspecified: Secondary | ICD-10-CM | POA: Diagnosis not present

## 2021-06-20 DIAGNOSIS — Z9889 Other specified postprocedural states: Secondary | ICD-10-CM | POA: Diagnosis not present

## 2021-06-20 DIAGNOSIS — R42 Dizziness and giddiness: Secondary | ICD-10-CM | POA: Diagnosis not present

## 2021-06-20 DIAGNOSIS — I10 Essential (primary) hypertension: Secondary | ICD-10-CM | POA: Diagnosis not present

## 2021-06-20 DIAGNOSIS — Z95818 Presence of other cardiac implants and grafts: Secondary | ICD-10-CM | POA: Diagnosis not present

## 2021-06-20 DIAGNOSIS — E782 Mixed hyperlipidemia: Secondary | ICD-10-CM

## 2021-06-20 DIAGNOSIS — I2510 Atherosclerotic heart disease of native coronary artery without angina pectoris: Secondary | ICD-10-CM

## 2021-06-20 DIAGNOSIS — I340 Nonrheumatic mitral (valve) insufficiency: Secondary | ICD-10-CM | POA: Diagnosis not present

## 2021-06-20 LAB — TSH: TSH: 2.19 u[IU]/mL (ref 0.450–4.500)

## 2021-06-20 NOTE — Progress Notes (Unsigned)
Enrolled patient for a 7 day Zio XT monitor to be mailed to patient home

## 2021-06-20 NOTE — Patient Instructions (Signed)
Medication Instructions:  Stop Plavix *If you need a refill on your cardiac medications before your next appointment, please call your pharmacy*   Lab Work: TSH today   If you have labs (blood work) drawn today and your tests are completely normal, you will receive your results only by: [place] (if you have MyChart) OR A paper copy in the mail If you have any lab test that is abnormal or we need to change your treatment, we will call you to review the results.   Testing/Procedures: [place]- Long Term Monitor Instructions  Your physician has requested you wear a ZIO patch monitor for 14 days.  This is a single patch monitor. [employer] supplies one patch monitor per enrollment. Additional stickers are not available. Please do not apply patch if you will be having a Nuclear Stress Test,  Echocardiogram, Cardiac CT, MRI, or Chest Xray during the period you would be wearing the  monitor. The patch cannot be worn during these tests. You cannot remove and re-apply the  ZIO XT patch monitor.  Your ZIO patch monitor will be mailed 3 day USPS to your address on file. It may take 3-5 days  to receive your monitor after you have been enrolled.  Once you have received your monitor, please review the enclosed instructions. Your monitor  has already been registered assigning a specific monitor serial # to you.  Billing and Patient Assistance Program Information  We have supplied [employer] with any of your insurance information on file for billing purposes. [employer] offers a sliding scale Patient Assistance Program for patients that do not have  insurance, or whose insurance does not completely cover the cost of the ZIO monitor.  You must apply for the Patient Assistance Program to qualify for this discounted rate.  To apply, please call [employer] at [phone], select option 4, select option 2, ask to apply for  Patient Assistance Program. Theodore Demark will ask your household income, and how many  people  are in your household. They will quote your out-of-pocket cost based on that information.  [employer] will also be able to set up a 93-month, interest-free payment plan if needed.  Applying the monitor   Shave hair from upper left chest.  Hold abrader disc by orange tab. Rub abrader in 40 strokes over the upper left chest as  indicated in your monitor instructions.  Clean area with 4 enclosed alcohol pads. Let dry.  Apply patch as indicated in monitor instructions. Patch will be placed under collarbone on left  side of chest with arrow pointing upward.  Rub patch adhesive wings for 2 minutes. Remove white label marked "1". Remove the white  label marked "2". Rub patch adhesive wings for 2 additional minutes.  While looking in a mirror, press and release button in center of patch. A small green light will  flash 3-4 times. This will be your only indicator that the monitor has been turned on.  Do not shower for the first 24 hours. You may shower after the first 24 hours.  Press the button if you feel a symptom. You will hear a small click. Record Date, Time and  Symptom in the Patient Logbook.  When you are ready to remove the patch, follow instructions on the last 2 pages of Patient  Logbook. Stick patch monitor onto the last page of Patient Logbook.  Place Patient Logbook in the blue and white box. Use locking tab on box and tape box closed  securely. The blue and  white box has prepaid postage on it. Please place it in the mailbox as  soon as possible. Your physician should have your test results approximately 7 days after the  monitor has been mailed back to [facility]  Call [place] at [phone] if you have questions regarding  your ZIO XT patch monitor. Call them immediately if you see an orange light blinking on your  monitor.  If your monitor falls off in less than 4 days, contact our Monitor department at [phone].  If your monitor becomes  loose or falls off after 4 days call [employer] at [phone] for  suggestions on securing your monitor    Follow-Up: At [facility], you and your health needs are our priority.  As part of our continuing mission to provide you with exceptional heart care, we have created designated Provider Care Teams.  These Care Teams include your primary Cardiologist (physician) and Advanced Practice Providers (APPs -  Physician Assistants and Nurse Practitioners) who all work together to provide you with the care you need, when you need it.  We recommend signing up for the patient portal called "MyChart".  Sign up information is provided on this After Visit Summary.  MyChart is used to connect with patients for Virtual Visits (Telemedicine).  Patients are able to view lab/test results, encounter notes, upcoming appointments, etc.  Non-urgent messages can be sent to your provider as well.   To learn more about what you can do with MyChart, go to [email or web address].    Your next appointment:   6 month(s)  The format for your next appointment:   In Person  Provider:   Eleonore Chiquito, MD

## 2021-06-20 NOTE — Progress Notes (Signed)
Cardiology Office Note:   Date:  06/20/2021  NAME:  Ryan Mendoza    MRN: [id number] DOB:  1934/09/17   PCP:  Maris Berger, MD  Cardiologist:  None  Electrophysiologist:  None   Referring MD: No ref. provider found   Chief Complaint  Patient presents with   Follow-up   History of Present Illness:   Ryan Mendoza is a 87 y.o. male with a hx of CAD s/p PCI, HTN, HLD, severe MR s/p mitraclip in February who presents for follow-up.  He reports he is doing fairly well.  He presents with his wife.  He plans to transition his care to our office.  He was seeing Dr. Agustin Cree in [facility].  History of CAD status post PCI.  Recent left heart cath with 50% ISR of the mid left circumflex stent.  He describes no chest pain or trouble breathing.  He reports that his symptoms of shortness of breath improved with MitraClip.  He has a faint 2 out of 6 holosystolic murmur suggestive of residual MR.  It is not go into the aorticopulmonary region will suggest this is moderate.  He is still on Plavix.  We will stop this.  They plan for 3 months of this.  He does report he gets dizzy at times.  He reports this occurs in the mornings.  He reports that his heart rate can be low in the 40s.  He has been followed for bradycardia in the past.  Monitor from August 2021 showed a minimum heart rate of 38.  He had no high-grade conduction disease.  No indication for pacer at that time.  His most recent TSH in August 2021 was 7.3.  I think this needs to be rechecked.  I wonder if he is hypothyroid.  He may need adjustment to his Synthroid medication.  He denies any chest pain.  He is a former smoker.  He quit several years ago.  He is a retired Administrator.  He reports more than 5,500,000 miles driving in his lifetime.  He has 3 children.  He has 5 grandchildren and 3 great-grandchildren.  He presents with his wife.  They are both still active.  They seem to be doing well as they are both 85 years of age.  He denies any major  symptoms in office today.  Problem List CAD -06/01/2016: PCI to mid LAD and mid LCX -[facility]HC 12/2020: 50% ISR LCX 2. Severe MR  -prolapsing A1 and flail medial P2 -NTW mitraclip 01/23/2021 -mild to moderate residual MR 3. Mild to moderate AI 4. HTN 5. HLD 6. CKD Stage 3  Past Medical History: Past Medical History:  Diagnosis Date   Anxiety    Aortic valve regurgitation    Arthritis    hands and knees   BPH (benign prostatic hyperplasia)    Bradycardia 11/22/2019   Cataract, left eye    Claustrophobia    Coronary artery disease    Coronary artery disease involving native coronary artery of native heart with angina pectoris ([place]) 12/08/2018   PTCA and stenting of LAD and circumflex in 2017  Formatting of this note might be different from the original. PTCA and drug-eluting stent to LAD as well as the circumflex in June 2017   Depression    no current meds for   Elevated cholesterol    Essential hypertension 06/19/2019   Gallstones    GERD (gastroesophageal reflux disease)    History of kidney stones  Hyperparathyroidism, primary ([place]) 09/27/2017   Hypertension    Hypothyroidism    Mitral regurgitation 12/08/2018   Mild to moderate by echo from August 2019   Myocardial infarction [facility])    SILENT   Obstructive sleep apnea 12/08/2018   Primary hyperparathyroidism ([place])    S/P mitral valve clip implantation 01/23/2021   s/p TEER using a single G4 NT W MitraClip device, placed on the lateral side of A2/P2 with Dr. Frutoso Chase    below right elbow healing well patient stated hurt working on lawn mower   Status post coronary artery stent placement 12/21/2017   Formatting of this note might be different from the original. LAD and Cx 2017   Tinnitus    both ears all the time    Past Surgical History: Past Surgical History:  Procedure Laterality Date   BUBBLE STUDY  10/11/2020   Procedure: BUBBLE STUDY;  Surgeon: Fay Records, MD;  Location: [place];  Service:  Cardiovascular;;   CATARACT EXTRACTION W/PHACO Left 04/12/2018   Procedure: CATARACT EXTRACTION PHACO AND INTRAOCULAR LENS PLACEMENT ([place]);  Surgeon: Birder Robson, MD;  Location: [facility];  Service: Ophthalmology;  Laterality: Left;  [place] 00:39.5 AP% 16.7 CDE 6.62 Fluid Pack Lot # [id number]   CORONARY ANGIOPLASTY     2017   EYE SURGERY Right    ioc for cataract   MITRAL VALVE REPAIR N/A 01/23/2021   Procedure: MITRAL VALVE REPAIR;  Surgeon: Sherren Mocha, MD;  Location: [place] CV LAB;  Service: Cardiovascular;  Laterality: N/A;   PARATHYROIDECTOMY Right 09/28/2017   Procedure: RIGHT INFERIOR PARATHYROIDECTOMY;  Surgeon: Armandina Gemma, MD;  Location: [facility];  Service: General;  Laterality: Right;   right eye  plug for tear duct surgery     RIGHT/LEFT HEART CATH AND CORONARY ANGIOGRAPHY N/A 01/10/2021   Procedure: RIGHT/LEFT HEART CATH AND CORONARY ANGIOGRAPHY;  Surgeon: Sherren Mocha, MD;  Location: [place] CV LAB;  Service: Cardiovascular;  Laterality: N/A;   stents to heart x 2  06/2016   TEE WITHOUT CARDIOVERSION N/A 10/11/2020   Procedure: TRANSESOPHAGEAL ECHOCARDIOGRAM (TEE);  Surgeon: Fay Records, MD;  Location: [place];  Service: Cardiovascular;  Laterality: N/A;   TEE WITHOUT CARDIOVERSION N/A 01/23/2021   Procedure: TRANSESOPHAGEAL ECHOCARDIOGRAM (TEE);  Surgeon: Sherren Mocha, MD;  Location: [place] [facility];  Service: Cardiovascular;  Laterality: N/A;   TONSILLECTOMY     age 38    Current Medications: Current Meds  Medication Sig   amLODipine (NORVASC) 10 MG tablet TAKE 1 TABLET BY MOUTH EVERY DAY   amoxicillin (AMOXIL) 500 MG tablet Take 4 tablets (2,000 mg) one hour prior to all dental visits.   aspirin EC 81 MG tablet Take by mouth daily.   atorvastatin (LIPITOR) 40 MG tablet TAKE 1 TABLET BY MOUTH EVERYDAY AT BEDTIME   carboxymethylcellulose (REFRESH PLUS) 0.5 % SOLN Place 1 drop into both eyes 3 (three) times daily as needed.   Cholecalciferol 25  MCG (1000 UT) capsule Take by mouth.   cloNIDine (CATAPRES) 0.1 MG tablet Take 1 tablet (0.1 mg total) by mouth 2 (two) times daily.   escitalopram (LEXAPRO) 5 MG tablet Take 5 mg by mouth daily.   lamoTRIgine (LAMICTAL) 25 MG tablet Take 1 tablet by mouth at bedtime.   levothyroxine (SYNTHROID) 50 MCG tablet Take 50 mcg by mouth daily before breakfast.    nitroGLYCERIN (NITROSTAT) 0.4 MG SL tablet Place 1 tablet (0.4 mg total) under the tongue every 5 (five) minutes  as needed for chest pain.   pantoprazole (PROTONIX) 40 MG tablet TAKE 1 TABLET BY MOUTH EVERY DAY   zolpidem (AMBIEN) 5 MG tablet Take 5 mg by mouth at bedtime.   [DISCONTINUED] clopidogrel (PLAVIX) 75 MG tablet TAKE 1 TABLET BY MOUTH DAILY WITH BREAKFAST.     Allergies:    Patient has no known allergies.   Social History: Social History   Socioeconomic History   Marital status: Married    Spouse name: Not on file   Number of children: 3   Years of education: Not on file   Highest education level: Not on file  Occupational History   Not on file  Tobacco Use   Smoking status: Former    Packs/day: 1.00    Years: 10.00    Pack years: 10.00    Types: Cigarettes   Smokeless tobacco: Never   Tobacco comments:    quit 1974  Vaping Use   Vaping Use: Never used  Substance and Sexual Activity   Alcohol use: No   Drug use: No   Sexual activity: Not on file  Other Topics Concern   Not on file  Social History Narrative   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  ROS:   All other ROS reviewed and negative. Pertinent positives noted in the HPI.      EKGs/Labs/Other Studies Reviewed:   The following studies were personally reviewed by me today:  TTE 02/21/2021   1. Left ventricular ejection fraction, by estimation, is 55 to 60%. The  left ventricle has normal function. The left ventricle has no regional  wall motion abnormalities. There is mild concentric left ventricular  hypertrophy. Left ventricular diastolic  parameters are consistent with Grade I diastolic dysfunction (impaired  relaxation). Elevated left atrial pressure.   2. Right ventricular systolic function is normal. The right ventricular  size is normal. There is normal pulmonary artery systolic pressure. The  estimated right ventricular systolic pressure is 96.7 mmHg.   3. Left atrial size was mildly dilated.   4. The mitral valve has been repaired/replaced. Mild to moderate mitral  valve regurgitation. No evidence of mitral stenosis. The mean mitral valve  gradient is 2.0 mmHg. There is a Mitra-Clip present in the mitral  position. Procedure Date: 01/23/2021.   5. The aortic valve is normal in structure. Aortic valve regurgitation is  mild to moderate. Mild to moderate aortic valve sclerosis/calcification is  present, without any evidence of aortic stenosis.   6. There is borderline dilatation of the ascending aorta, measuring 40  mm.   7. The inferior vena cava is normal in size with greater than 50%  respiratory variability, suggesting right atrial pressure of 3 mmHg.   Recent Labs: 08/06/2020: TSH 7.300 01/21/2021: ALT 22 01/24/2021: BUN 25; Creatinine, Ser 1.65; Hemoglobin 13.1; Platelets 197; Potassium 4.0; Sodium 138   Recent Lipid Panel No results found for: CHOL, TRIG, HDL, CHOLHDL, VLDL, LDLCALC, LDLDIRECT  Physical Exam:   VS:  BP 134/72   Pulse 64   Ht 5\' 7"  (1.702 m)   Wt 179 lb 9.6 oz (81.5  kg)   SpO2 94%   BMI 28.13 kg/m    Wt Readings from Last 3 Encounters:  06/20/21 179 lb 9.6 oz (81.5 kg)  02/20/21 166 lb (75.3 kg)  01/30/21 176 lb  (79.8 kg)    General: Well nourished, well developed, in no acute distress Head: Atraumatic, normal size  Eyes: PEERLA, EOMI  Neck: Supple, no JVD Endocrine: No thryomegaly Cardiac: Normal S1, S2; RRR; 2 out of 6 holosystolic murmur Lungs: Clear to auscultation bilaterally, no wheezing, rhonchi or rales  Abd: Soft, nontender, no hepatomegaly  Ext: No edema, pulses 2+ Musculoskeletal: No deformities, BUE and BLE strength normal and equal Skin: Warm and dry, no rashes   Neuro: Alert and oriented to person, place, time, and situation, CNII-XII grossly intact, no focal deficits  Psych: Normal mood and affect   ASSESSMENT:   Ryan Mendoza is a 87 y.o. male who presents for the following: 1. S/P mitral valve clip implantation   2. Severe mitral insufficiency   3. Coronary artery disease involving native coronary artery of native heart without angina pectoris   4. Mixed hyperlipidemia   5. Essential hypertension   6. Bradycardia   7. Dizziness     PLAN:   1. S/P mitral valve clip implantation 2. Severe mitral insufficiency -History of severe MR with a prolapsing A1 segment and flail medial P2 segment.  Status post MitraClip on 01/23/2021.  They use an NT W device.  He has mild to moderate residual MR.  Murmur is not impressive on exam.  He is feeling better.  Overall success. -Stop Plavix.  He completed 3 months of this.  Continue aspirin 81 mg daily.  He understands he needs dental prophylaxis for SBE. -He does have moderate aortic insufficiency.  We will continue to monitor this.  3. Coronary artery disease involving native coronary artery of native heart without angina pectoris 4. Mixed hyperlipidemia -Status post PCI to the LAD and mid circumflex in 2017 at [facility].  Recent left heart cath as part of work-up for MitraClip shows 50% ISR in the circumflex lesion.  No symptoms of angina.  Blood pressure well controlled.  He will continue his aspirin and statin.  5. Essential  hypertension -Well-controlled no change in medications.  6. Bradycardia 7. Dizziness -He has had sinus bradycardia for years.  He reports some dizziness and lightheadedness in the mornings.  He associates this with a low heart rate.  He needs an updated TSH.  Most recent TSH shows he was hypothyroid.  This could explain his bradycardia.  We also need to make sure there is no significant change in his conduction disease.  Have recommended a 7-day Zio patch.  Most recent monitor showed no evidence of high-grade disease.  We will make sure.  He is on no agents that would explain his bradycardia.  Disposition: Return in about 6 months (around 12/21/2021).  Medication Adjustments/Labs and Tests Ordered: Current medicines are reviewed at length with the patient today.  Concerns regarding medicines are outlined above.  Orders Placed This Encounter  Procedures   TSH   LONG TERM MONITOR (3-14 DAYS)    No orders of the defined types were placed in this encounter.   Patient Instructions  Medication Instructions:  Stop Plavix *If you need a refill on your cardiac medications before your next appointment, please call your pharmacy*   Lab Work: TSH today   If you have labs (blood work) drawn today and your tests are completely normal, you  will receive your results only by: [place] (if you have MyChart) OR A paper copy in the mail If you have any lab test that is abnormal or we need to change your treatment, we will call you to review the results.   Testing/Procedures: [place]- Long Term Monitor Instructions  Your physician has requested you wear a ZIO patch monitor for 14 days.  This is a single patch monitor. [employer] supplies one patch monitor per enrollment. Additional stickers are not available. Please do not apply patch if you will be having a Nuclear Stress Test,  Echocardiogram, Cardiac CT, MRI, or Chest Xray during the period you would be wearing the  monitor. The patch cannot  be worn during these tests. You cannot remove and re-apply the  ZIO XT patch monitor.  Your ZIO patch monitor will be mailed 3 day USPS to your address on file. It may take 3-5 days  to receive your monitor after you have been enrolled.  Once you have received your monitor, please review the enclosed instructions. Your monitor  has already been registered assigning a specific monitor serial # to you.  Billing and Patient Assistance Program Information  We have supplied [employer] with any of your insurance information on file for billing purposes. [employer] offers a sliding scale Patient Assistance Program for patients that do not have  insurance, or whose insurance does not completely cover the cost of the ZIO monitor.  You must apply for the Patient Assistance Program to qualify for this discounted rate.  To apply, please call [employer] at [phone], select option 4, select option 2, ask to apply for  Patient Assistance Program. Theodore Demark will ask your household income, and how many people  are in your household. They will quote your out-of-pocket cost based on that information.  [employer] will also be able to set up a 15-month, interest-free payment plan if needed.  Applying the monitor   Shave hair from upper left chest.  Hold abrader disc by orange tab. Rub abrader in 40 strokes over the upper left chest as  indicated in your monitor instructions.  Clean area with 4 enclosed alcohol pads. Let dry.  Apply patch as indicated in monitor instructions. Patch will be placed under collarbone on left  side of chest with arrow pointing upward.  Rub patch adhesive wings for 2 minutes. Remove white label marked "1". Remove the white  label marked "2". Rub patch adhesive wings for 2 additional minutes.  While looking in a mirror, press and release button in center of patch. A small green light will  flash 3-4 times. This will be your only indicator that the monitor has been turned on.  Do not shower  for the first 24 hours. You may shower after the first 24 hours.  Press the button if you feel a symptom. You will hear a small click. Record Date, Time and  Symptom in the Patient Logbook.  When you are ready to remove the patch, follow instructions on the last 2 pages of Patient  Logbook. Stick patch monitor onto the last page of Patient Logbook.  Place Patient Logbook in the blue and white box. Use locking tab on box and tape box closed  securely. The blue and white box has prepaid postage on it. Please place it in the mailbox as  soon as possible. Your physician should have your test results approximately 7 days after the  monitor has been mailed back to [facility].  Call [facility]  at [phone] if you have questions regarding  your ZIO XT patch monitor. Call them immediately if you see an orange light blinking on your  monitor.  If your monitor falls off in less than 4 days, contact our Monitor department at [phone].  If your monitor becomes loose or falls off after 4 days call [employer] at [phone] for  suggestions on securing your monitor    Follow-Up: At [facility], you and your health needs are our priority.  As part of our continuing mission to provide you with exceptional heart care, we have created designated Provider Care Teams.  These Care Teams include your primary Cardiologist (physician) and Advanced Practice Providers (APPs -  Physician Assistants and Nurse Practitioners) who all work together to provide you with the care you need, when you need it.  We recommend signing up for the patient portal called "MyChart".  Sign up information is provided on this After Visit Summary.  MyChart is used to connect with patients for Virtual Visits (Telemedicine).  Patients are able to view lab/test results, encounter notes, upcoming appointments, etc.  Non-urgent messages can be sent to your provider as well.   To learn more about what you can do with  MyChart, go to [email or web address].    Your next appointment:   6 month(s)  The format for your next appointment:   In Person  Provider:   Eleonore Chiquito, MD        Time Spent with Patient: I have spent a total of 35 minutes with patient reviewing hospital notes, telemetry, EKGs, labs and examining the patient as well as establishing an assessment and plan that was discussed with the patient.  > 50% of time was spent in direct patient care.  Signed, Addison Naegeli. Audie Box, MD, [place]  [address] [place], [place] [address] [phone]  06/20/2021 11:04 AM

## 2021-06-26 DIAGNOSIS — R42 Dizziness and giddiness: Secondary | ICD-10-CM

## 2021-07-07 DIAGNOSIS — N1832 Chronic kidney disease, stage 3b: Secondary | ICD-10-CM | POA: Diagnosis not present

## 2021-07-08 DIAGNOSIS — R42 Dizziness and giddiness: Secondary | ICD-10-CM | POA: Diagnosis not present

## 2021-07-16 DIAGNOSIS — I2510 Atherosclerotic heart disease of native coronary artery without angina pectoris: Secondary | ICD-10-CM | POA: Diagnosis not present

## 2021-07-16 DIAGNOSIS — N200 Calculus of kidney: Secondary | ICD-10-CM | POA: Diagnosis not present

## 2021-07-16 DIAGNOSIS — N1832 Chronic kidney disease, stage 3b: Secondary | ICD-10-CM | POA: Diagnosis not present

## 2021-07-16 DIAGNOSIS — N281 Cyst of kidney, acquired: Secondary | ICD-10-CM | POA: Diagnosis not present

## 2021-07-16 DIAGNOSIS — E210 Primary hyperparathyroidism: Secondary | ICD-10-CM | POA: Diagnosis not present

## 2021-07-16 DIAGNOSIS — I5189 Other ill-defined heart diseases: Secondary | ICD-10-CM | POA: Diagnosis not present

## 2021-07-16 DIAGNOSIS — I129 Hypertensive chronic kidney disease with stage 1 through stage 4 chronic kidney disease, or unspecified chronic kidney disease: Secondary | ICD-10-CM | POA: Diagnosis not present

## 2021-07-24 ENCOUNTER — Telehealth: Payer: Self-pay | Admitting: Cardiovascular Disease

## 2021-07-24 NOTE — Telephone Encounter (Signed)
The patient's daughter called in about the patient and his symptoms. She stated that during the day the patient's heart rate ranges from 40-52 and he is very tired. During the night, he has complaints of a racing heart and palpitations. She stated that he has a hard time sleeping. He denies chest pain and shortness of breath.   Per monitor results: The results of your recent heart monitor show no abnormal heart beats or arrhythmias to explain your symptoms. Your heart rate was not slow. Overall, I am quite pleased that your heart is beating normally, and your symptoms do not appear to be coming from your heart.    The patient did see a nephrologist and was started on flomax. She stated that the provider heard fluid on his lungs. She will have that provider send over the office note. She was not sure of the provider's name.

## 2021-07-24 NOTE — Telephone Encounter (Signed)
STAT if HR is under 50 or over 120 (normal HR is 60-100 beats per minute)  What is your heart rate? Was 76 when it was last check, but not with the patient  Do you have a log of your heart rate readings (document readings)? In 40's today, did not get above 52 until way after lunch, HR raced last night   Do you have any other symptoms? no   Patent's daughter states the patient's HR has been low today and was racing last night. She states she does not know what his HR was last night. She states he has no other symptoms. She says his neurologist or nephrologist put him on flomax, because he has fluid in his lungs and she will call his family doctor to get that checked.

## 2021-07-25 NOTE — Telephone Encounter (Signed)
Called patient daughter. Advised of message below from MD.  Patient verbalized understanding.

## 2021-07-30 ENCOUNTER — Other Ambulatory Visit: Payer: Self-pay | Admitting: Internal Medicine

## 2021-07-30 NOTE — Telephone Encounter (Signed)
Rx(s) sent to pharmacy electronically.  

## 2021-08-12 DIAGNOSIS — N1832 Chronic kidney disease, stage 3b: Secondary | ICD-10-CM | POA: Diagnosis not present

## 2021-08-12 DIAGNOSIS — R0989 Other specified symptoms and signs involving the circulatory and respiratory systems: Secondary | ICD-10-CM | POA: Diagnosis not present

## 2021-08-12 DIAGNOSIS — I129 Hypertensive chronic kidney disease with stage 1 through stage 4 chronic kidney disease, or unspecified chronic kidney disease: Secondary | ICD-10-CM | POA: Diagnosis not present

## 2021-08-12 DIAGNOSIS — R0602 Shortness of breath: Secondary | ICD-10-CM | POA: Diagnosis not present

## 2021-08-12 DIAGNOSIS — Z955 Presence of coronary angioplasty implant and graft: Secondary | ICD-10-CM | POA: Diagnosis not present

## 2021-08-29 ENCOUNTER — Other Ambulatory Visit: Payer: Self-pay | Admitting: Cardiology

## 2021-09-01 DIAGNOSIS — C4442 Squamous cell carcinoma of skin of scalp and neck: Secondary | ICD-10-CM | POA: Diagnosis not present

## 2021-09-01 DIAGNOSIS — L821 Other seborrheic keratosis: Secondary | ICD-10-CM | POA: Diagnosis not present

## 2021-09-01 DIAGNOSIS — L578 Other skin changes due to chronic exposure to nonionizing radiation: Secondary | ICD-10-CM | POA: Diagnosis not present

## 2021-09-01 DIAGNOSIS — L570 Actinic keratosis: Secondary | ICD-10-CM | POA: Diagnosis not present

## 2021-09-08 DIAGNOSIS — C4442 Squamous cell carcinoma of skin of scalp and neck: Secondary | ICD-10-CM | POA: Diagnosis not present

## 2021-09-19 ENCOUNTER — Other Ambulatory Visit: Payer: Self-pay | Admitting: Cardiology

## 2021-09-23 ENCOUNTER — Other Ambulatory Visit: Payer: Self-pay | Admitting: Cardiology

## 2021-10-17 ENCOUNTER — Other Ambulatory Visit: Payer: Self-pay | Admitting: Cardiology

## 2021-10-28 DIAGNOSIS — R252 Cramp and spasm: Secondary | ICD-10-CM | POA: Diagnosis not present

## 2021-10-28 DIAGNOSIS — Z0000 Encounter for general adult medical examination without abnormal findings: Secondary | ICD-10-CM | POA: Diagnosis not present

## 2021-10-28 DIAGNOSIS — N1832 Chronic kidney disease, stage 3b: Secondary | ICD-10-CM | POA: Diagnosis not present

## 2021-10-28 DIAGNOSIS — I129 Hypertensive chronic kidney disease with stage 1 through stage 4 chronic kidney disease, or unspecified chronic kidney disease: Secondary | ICD-10-CM | POA: Diagnosis not present

## 2021-10-28 DIAGNOSIS — Z23 Encounter for immunization: Secondary | ICD-10-CM | POA: Diagnosis not present

## 2021-10-28 DIAGNOSIS — M791 Myalgia, unspecified site: Secondary | ICD-10-CM | POA: Diagnosis not present

## 2021-10-28 DIAGNOSIS — Z79899 Other long term (current) drug therapy: Secondary | ICD-10-CM | POA: Diagnosis not present

## 2021-10-28 DIAGNOSIS — I252 Old myocardial infarction: Secondary | ICD-10-CM | POA: Diagnosis not present

## 2021-11-14 DIAGNOSIS — N1832 Chronic kidney disease, stage 3b: Secondary | ICD-10-CM | POA: Diagnosis not present

## 2021-11-17 DIAGNOSIS — E210 Primary hyperparathyroidism: Secondary | ICD-10-CM | POA: Diagnosis not present

## 2021-11-17 DIAGNOSIS — I5189 Other ill-defined heart diseases: Secondary | ICD-10-CM | POA: Diagnosis not present

## 2021-11-17 DIAGNOSIS — N200 Calculus of kidney: Secondary | ICD-10-CM | POA: Diagnosis not present

## 2021-11-17 DIAGNOSIS — I2510 Atherosclerotic heart disease of native coronary artery without angina pectoris: Secondary | ICD-10-CM | POA: Diagnosis not present

## 2021-11-17 DIAGNOSIS — N281 Cyst of kidney, acquired: Secondary | ICD-10-CM | POA: Diagnosis not present

## 2021-11-17 DIAGNOSIS — I129 Hypertensive chronic kidney disease with stage 1 through stage 4 chronic kidney disease, or unspecified chronic kidney disease: Secondary | ICD-10-CM | POA: Diagnosis not present

## 2021-11-17 DIAGNOSIS — N1832 Chronic kidney disease, stage 3b: Secondary | ICD-10-CM | POA: Diagnosis not present

## 2021-11-22 IMAGING — CR DG CHEST 2V
2 series · 2 of 2 positions shown · non-contrast
Comparison: 02/18/2020

CLINICAL DATA: Mitral valve insufficiency, preop evaluation

EXAM:
CHEST - 2 VIEW

[w chest pa]
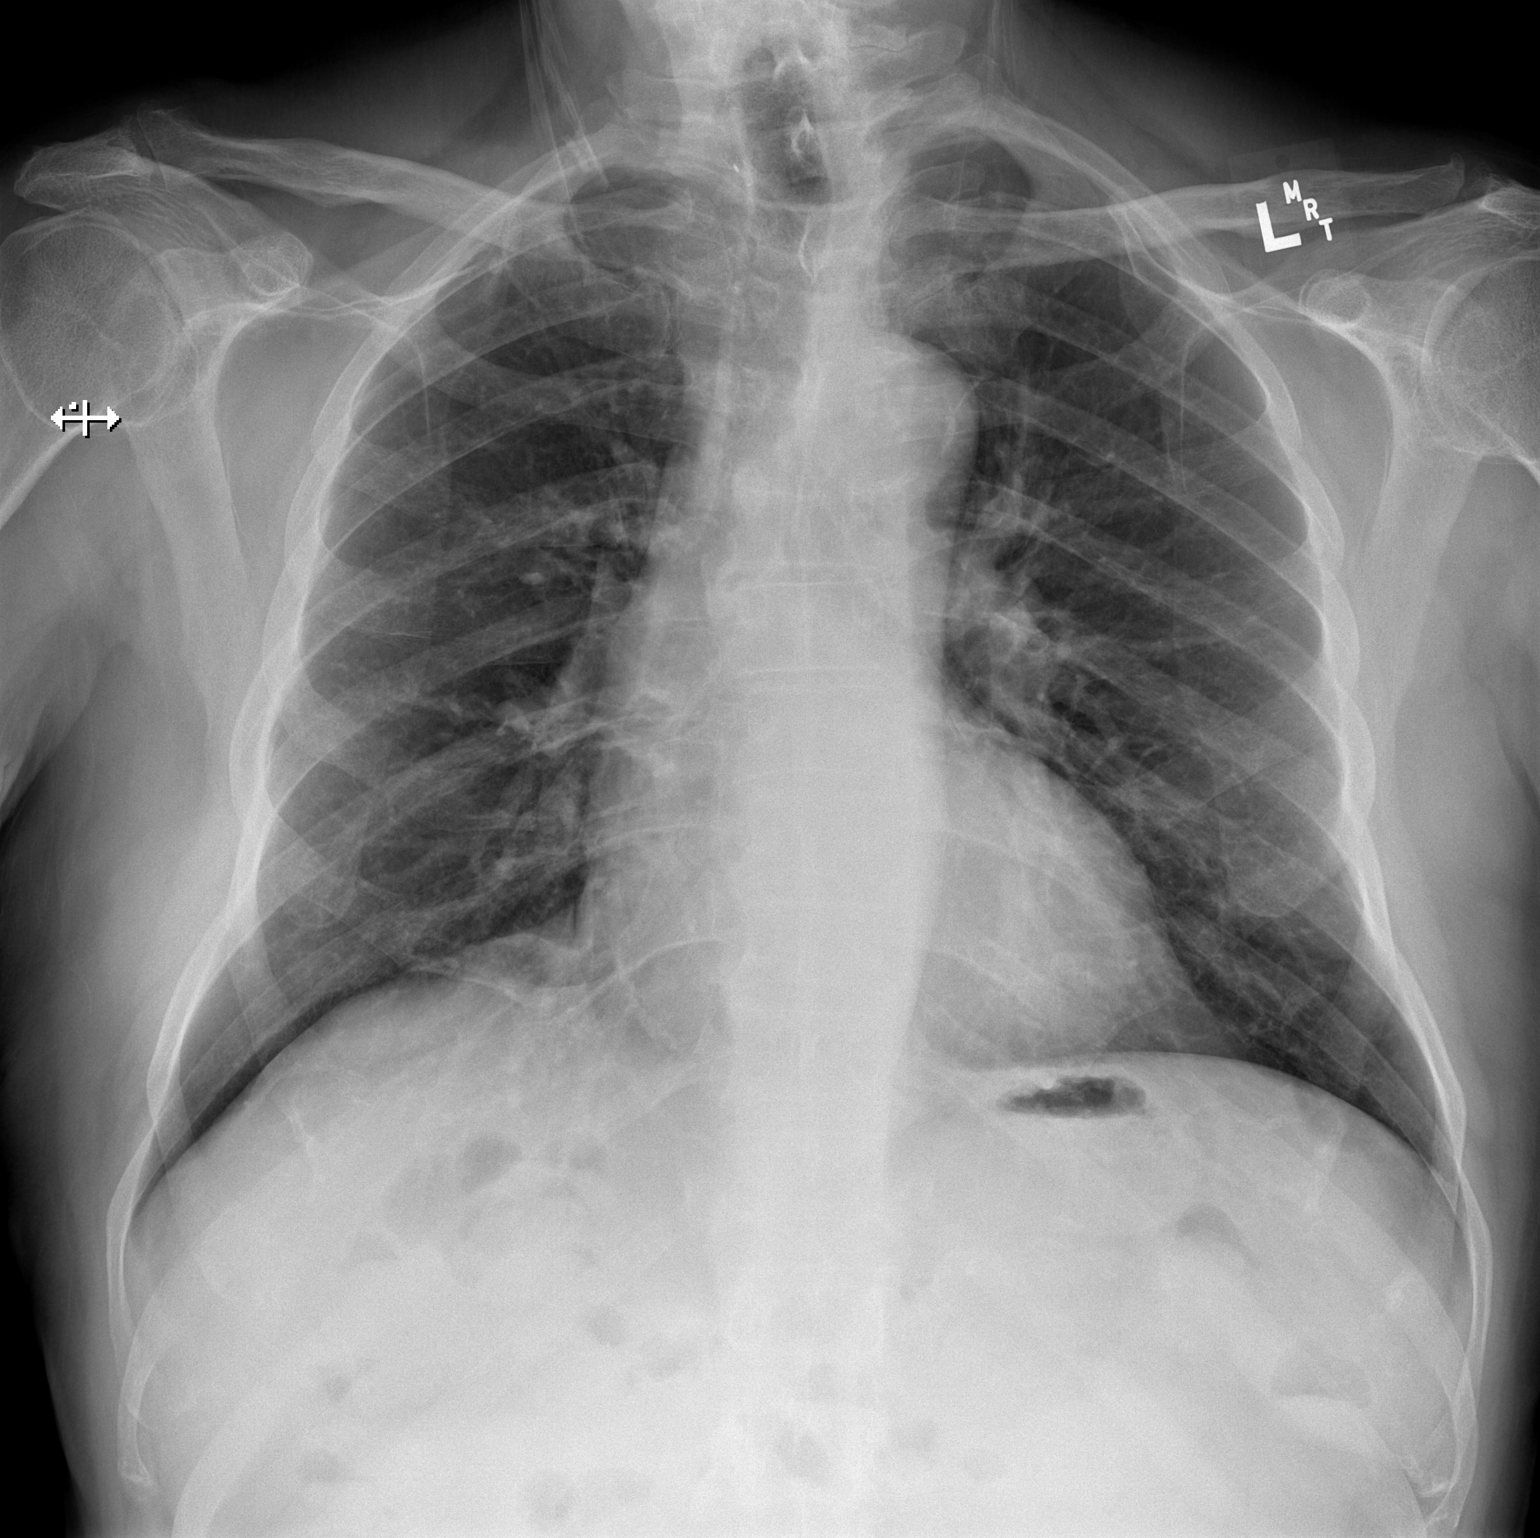

[w chest lat]
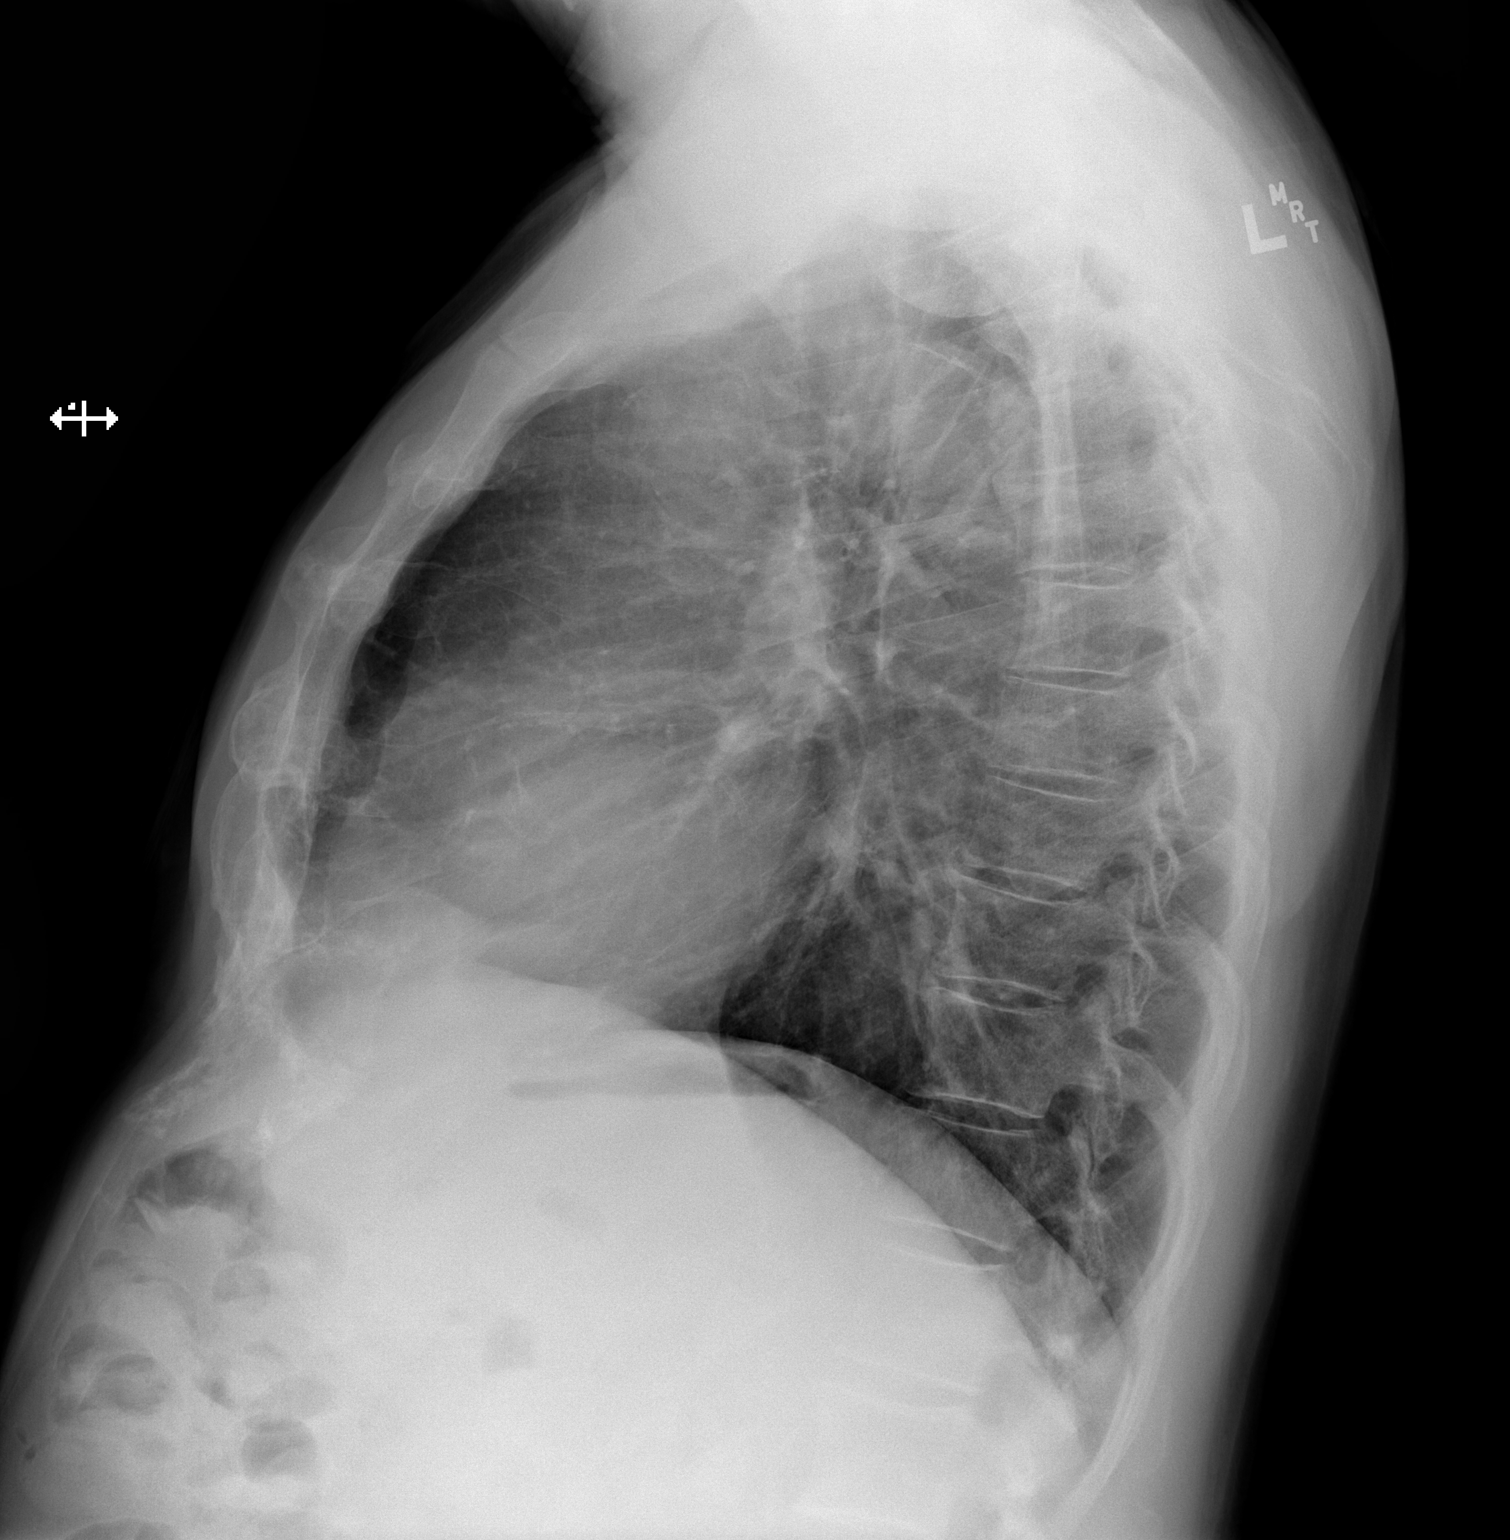

[2 of 2 positions shown; findings below may reference images not displayed]

FINDINGS: Cardiac shadow is within normal limits. Aortic calcifications are
seen. Lungs are clear bilaterally. No focal infiltrate or effusion
is seen. No bony abnormality is noted.
IMPRESSION: No acute abnormality noted.

## 2021-12-04 ENCOUNTER — Other Ambulatory Visit: Payer: Self-pay | Admitting: Physician Assistant

## 2021-12-04 DIAGNOSIS — Z95818 Presence of other cardiac implants and grafts: Secondary | ICD-10-CM

## 2021-12-04 DIAGNOSIS — I340 Nonrheumatic mitral (valve) insufficiency: Secondary | ICD-10-CM

## 2022-01-04 NOTE — Progress Notes (Signed)
[facility]                                     Cardiology Office Note:    Date:  01/06/2022   ID:  FIDENCIO DUDDY, DOB [birth year], MRN [id number]  PCP:  Maris Berger, MD  [facility] Cardiologist:   Dr. Krasowski>>Dr. Shiel[facility] Electrophysiologist:  None   Referring MD: Maris Berger, MD   Chief Complaint  Patient presents with   Follow-up    1 year s/p MitraClip    History of Present Illness:    Ryan Mendoza is a 88 y.o. male with a hx of  CAD s/p PCIs, HTN, HLD, hypothyroidism, bradycardia, anxiety, moderate to severe AI and severe mitral regurgitation s/p TEER (01/23/21) who presents for 1year follow up.    Ryan Mendoza' cardiac history dates back to 2017 when he reportedly presented with symptoms of chest pain and shortness of breath. He was treated at the time at [facility] where he apparently underwent PCI and stenting of the left anterior descending coronary artery and the left circumflex coronary artery. Over the past 2 years he has had worsening and progressive symptoms of exertional shortness of breath, decreased energy, fatigue, tightness across his chest, and increasing anxiety. In January 2020 he developed particularly a severe episode of substernal chest discomfort for which he was briefly hospitalized in [place], [place].  On review of the records from that admission he apparently was thought to be in atrial fibrillation on presentation but spontaneously converted to sinus rhythm.  He ruled out for acute myocardial infarction. Cardiac catheterization performed at that time revealed nonobstructive CAD and the patient was told that his chest pain was likely related to ulcer disease. He then began to experience worsening fatigue and exhaustion with increased exertional shortness of breath with occasional resting shortness of breath. Echocardiogram 02/2020 showed normal left  ventricular systolic function with severely dilated left atrium, moderate mitral regurgitation and mild to moderate aortic regurgitation. He was evaluated by Dr. Caryl Comes for concerns of possible symptomatic bradycardia, but findings were not suggestive of symptomatic bradycardia nor chronotropic incompetence. He was then seen in follow-up by Dr. Agustin Cree and TEE was recommended to further evaluate severity of mitral regurgitation. This was performed 10/11/2020 which showed what was felt to be severe mitral regurgitation with partially flail A2 and P2 segments of both the anterior and posterior leaflets and multiple eccentric jets of regurgitation with moderate aortic insufficiency. LV was reportedly "low normal" with ejection fraction estimated 50 to 55%.    He was then evaluated by the multidisciplinary valve team and felt to have severe, symptomatic non rheumatic mitral regurgitation and underwent successful transcatheter edge-to-edge mitral valve repair using a single G4 NT W MitraClip device, placed on the lateral side of A2/P2.  Mitral regurgitation reduced from 4+ preprocedure to 1+ post procedure with a residual eccentric jet along the medial aspect of P2. Post operative echo showed EF 55-60%, normally functioning MitraClip with mild residual MR with mean gradient of 3 mm hg and moderate to severe AR. He was discharged on ASA and Plavix x 3 months. Prilosec was changed to Protonix given potential drug drug interaction with Plavix.  He was last seen in follow up with Dr. Audie Box 06/20/21 at which time he had complaints of dizziness and was noted  to be bradycardic. Previous TSH was abnormal at 7.3 however repeat was WNL at 2.1. 7 day ZIO placed which confirmed bradycardia with min HR in the lower 40's however no significant arrhythmias, no significant bradycardia or conduction disease detected and occasional PVCs (1.0% burden).   Today he is here with his wife. Overall he states that he has been doing very  well. He is very active at baseline and stays busy with mowing grass and washing their cars daily. He does still have intermittent chest pain which sounds like stable angina. Pain comes on at different times. Sometimes with exertion and other times with lying flat. It is always relieved by one or two SL NTG. This was previously felt to be GI related. Cath from last year with non-obstructive disease and patent stents. He also continues to have chronic bradycardia. He is relatively asymptomatic but will notice HR's in the 30's on some mornings. At that time, he feels very fatigued with occasional dizziness. Most times his rates are in the 40-50 range. As above he wore a 7 day ZIO with no significant pausing, etc. to warrant PPM placement. He is on no AV blocking agents. Wonder if we place ZIO once again to reassess. Will speak with his primary cardiologist about this as this has been an ongoing issues for him. Echocardiogram today essentially unchanged from prior with a couple jets of MR, the most prominent is very eccentric, directed anteriorly along wall of left atrium. Overall MR is moderate in severity and stable when compared to echo from  02/2021.   Past Medical History:  Diagnosis Date   Anxiety    Aortic valve regurgitation    Arthritis    hands and knees   BPH (benign prostatic hyperplasia)    Bradycardia 11/22/2019   Cataract, left eye    Claustrophobia    Coronary artery disease    Coronary artery disease involving native coronary artery of native heart with angina pectoris ([place]) 12/08/2018   PTCA and stenting of LAD and circumflex in 2017  Formatting of this note might be different from the original. PTCA and drug-eluting stent to LAD as well as the circumflex in June 2017   Depression    no current meds for   Elevated cholesterol    Essential hypertension 06/19/2019   Gallstones    GERD (gastroesophageal reflux disease)    History of kidney stones    Hyperparathyroidism, primary ([place])  09/27/2017   Hypertension    Hypothyroidism    Mitral regurgitation 12/08/2018   Mild to moderate by echo from August 2019   Myocardial infarction [facility])    SILENT   Obstructive sleep apnea 12/08/2018   Primary hyperparathyroidism ([place])    S/P mitral valve clip implantation 01/23/2021   s/p TEER using a single G4 NT W MitraClip device, placed on the lateral side of A2/P2 with Dr. Frutoso Chase    below right elbow healing well patient stated hurt working on lawn mower   Status post coronary artery stent placement 12/21/2017   Formatting of this note might be different from the original. LAD and Cx 2017   Tinnitus    both ears all the time    Past Surgical History:  Procedure Laterality Date   BUBBLE STUDY  10/11/2020   Procedure: BUBBLE STUDY;  Surgeon: Fay Records, MD;  Location: [place];  Service: Cardiovascular;;   CATARACT EXTRACTION W/PHACO Left 04/12/2018   Procedure: CATARACT EXTRACTION PHACO AND INTRAOCULAR LENS PLACEMENT ([place]);  Surgeon: Birder Robson, MD;  Location: [facility];  Service: Ophthalmology;  Laterality: Left;  [place] 00:39.5 AP% 16.7 CDE 6.62 Fluid Pack Lot # [id number]   CORONARY ANGIOPLASTY     2017   EYE SURGERY Right    ioc for cataract   MITRAL VALVE REPAIR N/A 01/23/2021   Procedure: MITRAL VALVE REPAIR;  Surgeon: Sherren Mocha, MD;  Location: [place] [facility];  Service: Cardiovascular;  Laterality: N/A;   PARATHYROIDECTOMY Right 09/28/2017   Procedure: RIGHT INFERIOR PARATHYROIDECTOMY;  Surgeon: Armandina Gemma, MD;  Location: [facility];  Service: General;  Laterality: Right;   right eye  plug for tear duct surgery     RIGHT/LEFT HEART CATH AND CORONARY ANGIOGRAPHY N/A 01/10/2021   Procedure: RIGHT/LEFT HEART CATH AND CORONARY ANGIOGRAPHY;  Surgeon: Sherren Mocha, MD;  Location: [place] [facility];  Service: Cardiovascular;  Laterality: N/A;   stents to heart x 2  06/2016   TEE WITHOUT CARDIOVERSION N/A 10/11/2020   Procedure: TRANSESOPHAGEAL  ECHOCARDIOGRAM (TEE);  Surgeon: Fay Records, MD;  Location: [place];  Service: Cardiovascular;  Laterality: N/A;   TEE WITHOUT CARDIOVERSION N/A 01/23/2021   Procedure: TRANSESOPHAGEAL ECHOCARDIOGRAM (TEE);  Surgeon: Sherren Mocha, MD;  Location: [place] [facility];  Service: Cardiovascular;  Laterality: N/A;   TONSILLECTOMY     age 79    Current Medications: Current Meds  Medication Sig   amLODipine (NORVASC) 10 MG tablet TAKE 1 TABLET BY MOUTH EVERY DAY   amoxicillin (AMOXIL) 500 MG tablet Take 4 tablets (2,000 mg) one hour prior to all dental visits.   aspirin EC 81 MG tablet Take by mouth daily.   atorvastatin (LIPITOR) 40 MG tablet TAKE 1 TABLET BY MOUTH EVERYDAY AT BEDTIME   carboxymethylcellulose (REFRESH PLUS) 0.5 % SOLN Place 1 drop into both eyes 3 (three) times daily as needed.   Cholecalciferol 25 MCG (1000 UT) capsule Take by mouth.   cloNIDine (CATAPRES) 0.1 MG tablet TAKE 1 TABLET BY MOUTH TWICE A DAY   escitalopram (LEXAPRO) 5 MG tablet Take 5 mg by mouth daily.   lamoTRIgine (LAMICTAL) 25 MG tablet Take 1 tablet by mouth at bedtime.   levothyroxine (SYNTHROID) 50 MCG tablet Take 50 mcg by mouth daily before breakfast.    nitroGLYCERIN (NITROSTAT) 0.4 MG SL tablet Place 1 tablet (0.4 mg total) under the tongue every 5 (five) minutes as needed for chest pain.   pantoprazole (PROTONIX) 40 MG tablet TAKE 1 TABLET BY MOUTH EVERY DAY     Allergies:   Patient has no known allergies.   Social History   Socioeconomic History   Marital status: Married    Spouse name: Not on file   Number of children: 3   Years of education: Not on file   Highest education level: Not on file  Occupational History   Not on file  Tobacco Use   Smoking status: Former    Packs/day: 1.00    Years: 10.00    Pack years: 10.00    Types: Cigarettes   Smokeless tobacco: Never   Tobacco comments:    quit 1974  Vaping Use   Vaping Use: Never used  Substance and Sexual Activity    Alcohol use: No   Drug use: No   Sexual activity: Not on file  Other Topics Concern   Not on file  Social History Narrative   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  ROS:   Please see the history of present illness.    All other systems reviewed and are negative.  EKGs/Labs/Other Studies Reviewed:    The following studies were reviewed today:  Echocardiogram 01/05/22:   1. Left ventricular ejection fraction, by estimation, is 50 to 55%. The  left ventricle has low normal function. The left ventricular internal  cavity size was mildly dilated.   2. Right ventricular systolic function is normal. The right ventricular  size is normal.   3. Right atrial size was mild to moderately dilated.   4. S/p MitraClip (Feb 2022) There are a couple jets of MR, the most  prominent is very eccentric, directed anteriorly along wall of left  atrium. Overall MR is probable moderate in severity Compared to echo from  March 2022, no significant change . The  mitral valve has been repaired/replaced.   5. Tricuspid valve regurgitation is mild to moderate.   6. Aortic insufficiency is originates centrally and is directed  posteriorly into LVOT and LV. Marland Kitchen The aortic valve is abnormal. Aortic valve  regurgitation is not visualized. Aortic valve sclerosis is present, with  no evidence of aortic valve stenosis.   7. Pulmonic valve regurgitation is moderate.   8. Aortic dilatation noted. There is borderline dilatation of the aortic  root, measuring 39 mm. There is mild dilatation of the ascending aorta,  measuring 42 mm.   9. The inferior vena  cava is normal in size with greater than 50%  respiratory variability, suggesting right atrial pressure of 3 mmHg.   ZIO 07/08/21:  Enrollment 06/26/2021-07/03/2021 (7 days 11 hours). Patient had a min HR of 43 bpm (sinus bradycardia), max HR of 160 bpm (supraventricular tachycardia), and avg HR of 58 bpm (sinus bradycardia). Predominant underlying rhythm was Sinus Rhythm. 70 Supraventricular Tachycardia runs occurred, the run with the fastest interval lasting 6 beats (2.5 second duration) with a max rate of 160 bpm, the longest lasting 17 beats (10.5 second duration) with an avg rate of 95 bpm. Isolated SVEs were rare (<1.0%), SVE Couplets were rare (<1.0%), and SVE Triplets were rare (<1.0%). Isolated VEs were occasional (1.0%, 6184), VE Couplets were rare (<1.0%, 217), and VE Triplets were rare (<1.0%, 1).   06/27/21 10:05am short of breath coincided with sinus bradycardia 49 bpm.  06/28/21 08:30am short of breath coincided with normal sinus rhythm 66 bpm.  06/30/21 09:15am skipped/irregular beat(s) coincided with sinus bradycardia 45 bpm.    Impression:   1. No significant arrhythmias.  2. No significant bradycardia or conduction disease detected.  3. Occasional PVCs (1.0% burden).    01/23/2021 MITRAL VALVE REPAIR  Conclusion Successful transcatheter edge-to-edge mitral valve repair using a single G4 NT W MitraClip device, placed on the lateral side of A2/P2.  Mitral regurgitation reduced from 4+ preprocedure to 1+ post procedure with a residual eccentric jet along the medial aspect of P2.   Recommend: Aspirin and clopidogrel x3 months Limited echo tomorrow morning  ________________________   Echo 01/24/21: IMPRESSIONS   1. Left ventricular ejection fraction, by estimation, is 55 to 60%. The  left ventricle has normal function. The left ventricle has no regional  wall motion abnormalities. The left ventricular  internal cavity size was  mildly dilated. Left ventricular diastolic  parameters are indeterminate.   2. Right ventricular systolic function is normal. The right ventricular  size is normal. There is normal pulmonary artery systolic pressure.   3. Left atrial size was moderately dilated.   4. The septum was no well visualized could not identify residual ASD from  trans septal with left to right shunting seen by TEE day before.   5. Right atrial size was moderately dilated.   6. Post MV clip with single NTW device between A12/P2. Stable appearing  with dual diastolic inflows Mean gradient 3 mmhg no MVA calculated.  Residual anteriorly directed jet from the flail P3 segment not well  characterized with some splay artifact. The  mitral valve has been repaired/replaced. Mild mitral valve regurgitation.  No evidence of mitral stenosis. There is a Nutritional therapist NTW valve present in  the mitral position. Procedure Date: 01/23/2021.   7. The aortic valve is abnormal. Aortic valve regurgitation is moderate  to severe. Mild to moderate aortic valve sclerosis/calcification is  present, without any evidence of aortic stenosis.   8. The inferior vena cava is normal in size with greater than 50%  respiratory variability, suggesting right atrial pressure of 3 mmHg.   _____________________   Echo 02/20/2021 IMPRESSIONS  1. Left ventricular ejection fraction, by estimation, is 55 to 60%. The left ventricle has normal function. The left ventricle has no regional wall motion abnormalities. There is mild concentric left ventricular hypertrophy. Left ventricular diastolic  parameters are consistent with Grade I diastolic dysfunction (impaired relaxation). Elevated left atrial pressure.  2. Right ventricular systolic function is normal. The right ventricular size is normal. There is normal pulmonary artery systolic pressure. The estimated right ventricular systolic pressure is 46.2 mmHg.  3. Left atrial size was mildly dilated.  4. The mitral valve has been repaired/replaced. Mild to  moderate mitral valve regurgitation. No evidence of mitral stenosis. The mean mitral valve gradient is 2.0 mmHg. There is a Mitra-Clip present in the mitral position. Procedure Date: 01/23/2021.  5. The aortic valve is normal in structure. Aortic valve regurgitation is mild to moderate. Mild to moderate aortic valve sclerosis/calcification is present, without any evidence of aortic stenosis.  6. There is borderline dilatation of the ascending aorta, measuring 40 mm.  7. The inferior vena cava is normal in size with greater than 50% respiratory variability, suggesting right atrial pressure of 3 mmHg.   Comparison(s): No significant change from prior study.   EKG:  EKG is not ordered today.    Recent Labs: 01/21/2021: ALT 22 01/24/2021: BUN 25; Creatinine, Ser 1.65; Hemoglobin 13.1; Platelets 197; Potassium 4.0; Sodium 138 06/20/2021: TSH 2.190   Recent Lipid Panel No results found for: CHOL, TRIG, HDL, CHOLHDL, VLDL, LDLCALC, LDLDIRECT  Physical Exam:    VS:  BP (!) 130/42    Pulse (!) 56    Wt 182 lb 6.4 oz (82.7 kg)    SpO2 97%    BMI 28.57 kg/m     Wt Readings from Last 3 Encounters:  01/05/22 182 lb 6.4 oz (82.7 kg)  06/20/21 179 lb 9.6 oz (81.5 kg)  02/20/21 166 lb (75.3 kg)    General: Well developed, well nourished, NAD Neck: Negative for carotid bruits. No JVD Lungs:Clear to ausculation bilaterally. Breathing is unlabored. Cardiovascular: RRR with S1 S2. No murmurs Extremities: No edema.  Neuro: Alert and oriented. No focal deficits. No facial asymmetry. MAE spontaneously. Psych: Responds to questions appropriately  with normal affect.    ASSESSMENT/PLAN:    Severe MR s/p TEER: Echo today with LVEF at 50-55%, essentially unchanged from prior with a couple jets of MR, the most prominent is very eccentric, directed anteriorly along wall of left atrium. Overall MR is moderate in severity and stable when compared to echo from 02/2021. Remains very active with only very mild SOB with  exertion. No LE edema, orthopnea. Follows with renal who placed him on low dose Lasix (he is checking on dosing as this was not on his medication list). He has NYHA class II symptoms. Continue ASA 81mg  PO QD and can discontinue Plavix. Continue lifelong SBE with Amoxicillin. Plan to follow with Dr. Audie Box.    Aortic valve insufficiency: Echo today reports centrally directed AI to posterior LVOT/LV with no AS. Continue to monitor.   Ascending aortic dilation: On echo measuring at the aortic root at 58mm and ascending aorta at 70mm. Continue to monitor with surveillance echo.   Bradycardia: Continues to have significant bradycardia at times with reports of HRs in the 30's, mostly in the mornings. This stabilizes to the 40-50's throughout the day. Symptomatic with rates in the 30's with lightheadedness/dizziness/fatigue. No falls or LOC. Previous ZIO with no indication for PPM. Considering another ZIO, loop? Not on any AV blocking agents. Will discuss with Dr. Audie Box.   Stable angina: Cath last year with non-obstructive disease and patent stents. Chest pain with exertion at times, relieved with SL NTG. Also has characteristics suspicious for GERD.    GERD: Encouraged to restart Prilosec given he no longer needs Plavix. See above    Medication Adjustments/Labs and Tests Ordered: Current medicines are reviewed at length with the patient today.  Concerns regarding medicines are outlined above.  No orders of the defined types were placed in this encounter.  No orders of the defined types were placed in this encounter.   Patient Instructions  Medication Instructions:  Your physician recommends that you continue on your current medications as directed. Please refer to the Current Medication list given to you today.  *If you need a refill on your cardiac medications before your next appointment, please call your pharmacy*   Lab Work: NONE If you have labs (blood work) drawn today and your tests are  completely normal, you will receive your results only by: [place] (if you have MyChart) OR A paper copy in the mail If you have any lab test that is abnormal or we need to change your treatment, we will call you to review the results.   Testing/Procedures: NONE   Follow-Up: At [facility], you and your health needs are our priority.  As part of our continuing mission to provide you with exceptional heart care, we have created designated Provider Care Teams.  These Care Teams include your primary Cardiologist (physician) and Advanced Practice Providers (APPs -  Physician Assistants and Nurse Practitioners) who all work together to provide you with the care you need, when you need it.  We recommend signing up for the patient portal called "MyChart".  Sign up information is provided on this After Visit Summary.  MyChart is used to connect with patients for Virtual Visits (Telemedicine).  Patients are able to view lab/test results, encounter notes, upcoming appointments, etc.  Non-urgent messages can be sent to your provider as well.   To learn more about what you can do with MyChart, go to [email or web address].    Your next appointment:   6 month(s)  The format for  your next appointment:   In Person  Provider:   DR. Loraine Grip, NP  01/06/2022 3:13 PM    [place]

## 2022-01-05 ENCOUNTER — Other Ambulatory Visit: Payer: Self-pay

## 2022-01-05 ENCOUNTER — Encounter: Payer: Self-pay | Admitting: Cardiology

## 2022-01-05 ENCOUNTER — Ambulatory Visit: Payer: Medicare Other | Admitting: Cardiology

## 2022-01-05 ENCOUNTER — Ambulatory Visit (HOSPITAL_COMMUNITY): Payer: Medicare Other | Attending: Internal Medicine

## 2022-01-05 VITALS — BP 130/42 | HR 56 | Wt 182.4 lb

## 2022-01-05 DIAGNOSIS — Z95818 Presence of other cardiac implants and grafts: Secondary | ICD-10-CM | POA: Insufficient documentation

## 2022-01-05 DIAGNOSIS — Z9889 Other specified postprocedural states: Secondary | ICD-10-CM

## 2022-01-05 DIAGNOSIS — I351 Nonrheumatic aortic (valve) insufficiency: Secondary | ICD-10-CM

## 2022-01-05 DIAGNOSIS — K219 Gastro-esophageal reflux disease without esophagitis: Secondary | ICD-10-CM

## 2022-01-05 DIAGNOSIS — I340 Nonrheumatic mitral (valve) insufficiency: Secondary | ICD-10-CM | POA: Insufficient documentation

## 2022-01-05 DIAGNOSIS — I10 Essential (primary) hypertension: Secondary | ICD-10-CM

## 2022-01-05 DIAGNOSIS — R001 Bradycardia, unspecified: Secondary | ICD-10-CM

## 2022-01-05 DIAGNOSIS — I77810 Thoracic aortic ectasia: Secondary | ICD-10-CM

## 2022-01-05 DIAGNOSIS — R079 Chest pain, unspecified: Secondary | ICD-10-CM

## 2022-01-05 LAB — ECHOCARDIOGRAM COMPLETE
Area-P 1/2: 2.29 cm2
MV VTI: 1.64 cm2
P 1/2 time: 602 msec
S' Lateral: 4.2 cm

## 2022-01-05 NOTE — Patient Instructions (Signed)
Medication Instructions:  Your physician recommends that you continue on your current medications as directed. Please refer to the Current Medication list given to you today.  *If you need a refill on your cardiac medications before your next appointment, please call your pharmacy*   Lab Work: NONE If you have labs (blood work) drawn today and your tests are completely normal, you will receive your results only by: [place] (if you have MyChart) OR A paper copy in the mail If you have any lab test that is abnormal or we need to change your treatment, we will call you to review the results.   Testing/Procedures: NONE   Follow-Up: At [facility], you and your health needs are our priority.  As part of our continuing mission to provide you with exceptional heart care, we have created designated Provider Care Teams.  These Care Teams include your primary Cardiologist (physician) and Advanced Practice Providers (APPs -  Physician Assistants and Nurse Practitioners) who all work together to provide you with the care you need, when you need it.  We recommend signing up for the patient portal called "MyChart".  Sign up information is provided on this After Visit Summary.  MyChart is used to connect with patients for Virtual Visits (Telemedicine).  Patients are able to view lab/test results, encounter notes, upcoming appointments, etc.  Non-urgent messages can be sent to your provider as well.   To learn more about what you can do with MyChart, go to [email or web address].    Your next appointment:   6 month(s)  The format for your next appointment:   In Person  Provider:   DR. Audie Box

## 2022-01-07 ENCOUNTER — Other Ambulatory Visit: Payer: Self-pay | Admitting: Cardiology

## 2022-01-07 ENCOUNTER — Ambulatory Visit (INDEPENDENT_AMBULATORY_CARE_PROVIDER_SITE_OTHER): Payer: Medicare Other

## 2022-01-07 ENCOUNTER — Telehealth: Payer: Self-pay | Admitting: Cardiology

## 2022-01-07 DIAGNOSIS — R001 Bradycardia, unspecified: Secondary | ICD-10-CM

## 2022-01-07 NOTE — Telephone Encounter (Signed)
Spoke with patients daughter regarding plan from Monday. After speaking with Dr. Audie Box, we will place a 7 day ZIO to reassess bradycardia. Echocardiogram results from 01/05/22 discussed as well. All questions answered. We will follow for ZIO results.    Kathyrn Drown NP-C Structural Heart Team  Pager: [phone] Phone: [phone]'

## 2022-01-07 NOTE — Progress Notes (Unsigned)
Enrolled patient for a 7 day Zio XT monitor to be mailed to patients home   [place] to read

## 2022-01-14 DIAGNOSIS — R001 Bradycardia, unspecified: Secondary | ICD-10-CM

## 2022-04-11 ENCOUNTER — Other Ambulatory Visit: Payer: Self-pay | Admitting: Cardiovascular Disease

## 2022-04-26 ENCOUNTER — Other Ambulatory Visit: Payer: Self-pay | Admitting: Cardiology

## 2022-06-05 ENCOUNTER — Ambulatory Visit: Payer: Medicare Other | Admitting: Cardiovascular Disease

## 2022-06-17 NOTE — Progress Notes (Unsigned)
Cardiology Office Note:   Date:  06/18/2022  NAME:  Ryan Mendoza    MRN: [id number] DOB:  1934-04-07   PCP:  Maris Berger, MD  Cardiologist:  None  Electrophysiologist:  None   Referring MD: Maris Berger, MD   Chief Complaint  Patient presents with   Follow-up        History of Present Illness:   Ryan Mendoza is a 88 y.o. male with a hx of severe MR s/p clip, HTN, HLD, CKD 3 who presents for follow-up.  He reports he is doing well.  Denies any chest pain or trouble breathing.  Reports intermittent rapid heartbeat sensation that can last seconds.  Can occur 2-3 times per week.  Symptoms do not really bother him.  Blood pressure is stable.  Murmur is very soft today on exam.  Most recent echo is unremarkable.  Denies any symptoms in office today.  Overall doing well.  Remains on aspirin.  Understands he needs antibiotics before dental work.  Problem List CAD -06/01/2016: PCI to mid LAD and mid LCX -LHC 12/2020: 50% ISR LCX 2. Severe MR  -prolapsing A1 and flail medial P2 -NTW mitraclip 01/23/2021 -mild to moderate residual MR 3. Mild to moderate AI 4. HTN 5. HLD 6. CKD Stage 3  Past Medical History: Past Medical History:  Diagnosis Date   Anxiety    Aortic valve regurgitation    Arthritis    hands and knees   BPH (benign prostatic hyperplasia)    Bradycardia 11/22/2019   Cataract, left eye    Claustrophobia    Coronary artery disease    Coronary artery disease involving native coronary artery of native heart with angina pectoris ([place]) 12/08/2018   PTCA and stenting of LAD and circumflex in 2017  Formatting of this note might be different from the original. PTCA and drug-eluting stent to LAD as well as the circumflex in June 2017   Depression    no current meds for   Elevated cholesterol    Essential hypertension 06/19/2019   Gallstones    GERD (gastroesophageal reflux disease)    History of kidney stones    Hyperparathyroidism, primary ([place]) 09/27/2017    Hypertension    Hypothyroidism    Mitral regurgitation 12/08/2018   Mild to moderate by echo from August 2019   Myocardial infarction ([place])    SILENT   Obstructive sleep apnea 12/08/2018   Primary hyperparathyroidism ([place])    S/P mitral valve clip implantation 01/23/2021   s/p TEER using a single G4 NT W MitraClip device, placed on the lateral side of A2/P2 with Dr. Frutoso Chase    below right elbow healing well patient stated hurt working on lawn mower   Status post coronary artery stent placement 12/21/2017   Formatting of this note might be different from the original. LAD and Cx 2017   Tinnitus    both ears all the time    Past Surgical History: Past Surgical History:  Procedure Laterality Date   BUBBLE STUDY  10/11/2020   Procedure: BUBBLE STUDY;  Surgeon: Fay Records, MD;  Location: [facility];  Service: Cardiovascular;;   CATARACT EXTRACTION W/PHACO Left 04/12/2018   Procedure: CATARACT EXTRACTION PHACO AND INTRAOCULAR LENS PLACEMENT ([place]);  Surgeon: Birder Robson, MD;  Location: [facility];  Service: Ophthalmology;  Laterality: Left;  [place] 00:39.5 AP% 16.7 CDE 6.62 Fluid Pack Lot # [id number]   CORONARY ANGIOPLASTY     2017   EYE SURGERY  Right    ioc for cataract   MITRAL VALVE REPAIR N/A 01/23/2021   Procedure: MITRAL VALVE REPAIR;  Surgeon: Sherren Mocha, MD;  Location: [facility];  Service: Cardiovascular;  Laterality: N/A;   PARATHYROIDECTOMY Right 09/28/2017   Procedure: RIGHT INFERIOR PARATHYROIDECTOMY;  Surgeon: Armandina Gemma, MD;  [facility]: [facility];  Service: General;  Laterality: Right;   right eye  plug for tear duct surgery     RIGHT/LEFT HEART CATH AND CORONARY ANGIOGRAPHY N/A 01/10/2021   Procedure: RIGHT/LEFT HEART CATH AND CORONARY ANGIOGRAPHY;  Surgeon: Sherren Mocha, MD;  Location: [place] [facility];  Service: Cardiovascular;  Laterality: N/A;   stents to heart x 2  06/2016   TEE WITHOUT CARDIOVERSION N/A 10/11/2020   Procedure:  TRANSESOPHAGEAL ECHOCARDIOGRAM (TEE);  Surgeon: Fay Records, MD;  Location: [facility];  Service: Cardiovascular;  Laterality: N/A;   TEE WITHOUT CARDIOVERSION N/A 01/23/2021   Procedure: TRANSESOPHAGEAL ECHOCARDIOGRAM (TEE);  Surgeon: Sherren Mocha, MD;  Location: [facility];  Service: Cardiovascular;  Laterality: N/A;   TONSILLECTOMY     age 89    Current Medications: Current Meds  Medication Sig   amLODipine (NORVASC) 10 MG tablet TAKE 1 TABLET BY MOUTH EVERY DAY   amoxicillin (AMOXIL) 500 MG tablet Take 4 tablets (2,000 mg) one hour prior to all dental visits.   aspirin EC 81 MG tablet Take by mouth daily.   atorvastatin (LIPITOR) 40 MG tablet TAKE 1 TABLET BY MOUTH EVERYDAY AT BEDTIME   carboxymethylcellulose (REFRESH PLUS) 0.5 % SOLN Place 1 drop into both eyes 3 (three) times daily as needed.   Cholecalciferol (VITAMIN D3) 10 MCG (400 UNIT) CAPS    Cholecalciferol 25 MCG (1000 UT) capsule Take by mouth.   cloNIDine (CATAPRES) 0.1 MG tablet TAKE 1 TABLET BY MOUTH TWICE A DAY   escitalopram (LEXAPRO) 5 MG tablet Take 5 mg by mouth daily.   lamoTRIgine (LAMICTAL) 25 MG tablet Take 1 tablet by mouth at bedtime.   levothyroxine (SYNTHROID) 50 MCG tablet Take 50 mcg by mouth daily before breakfast.    nitroGLYCERIN (NITROSTAT) 0.4 MG SL tablet Place 1 tablet (0.4 mg total) under the tongue every 5 (five) minutes as needed for chest pain.   pantoprazole (PROTONIX) 40 MG tablet TAKE 1 TABLET BY MOUTH EVERY DAY     Allergies:    Patient has no known allergies.   Social History: Social History   Socioeconomic History   Marital status: Married    Spouse name: Not on file   Number of children: 3   Years of education: Not on file   Highest education level: Not on file  Occupational History   Not on file  Tobacco Use   Smoking status: Former    Packs/day: 1.00    Years: 10.00    Total pack years: 10.00    Types: Cigarettes   Smokeless tobacco: Never   Tobacco  comments:    quit 1974  Vaping Use   Vaping Use: Never used  Substance and Sexual Activity   Alcohol use: No   Drug use: No   Sexual activity: Not on file  Other Topics Concern   Not on file  Social History Narrative   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  ROS:   All other ROS reviewed and negative. Pertinent positives noted in the HPI.     EKGs/Labs/Other Studies Reviewed:   The following studies were personally reviewed by me today:  [place] 01/07/2022 Impression: 1. Brief ectopic atrial tachycardia detected (longest 10.1 seconds).  2. No significant bradycardia.  3. Rare ectopy.   TTE 01/05/2022  1. Left ventricular ejection fraction, by estimation, is 50 to 55%. The  left ventricle has low normal function. The left ventricular internal  cavity size was mildly dilated.   2. Right ventricular systolic function is normal. The right ventricular  size is normal.   3. Right atrial size was mild to moderately dilated.   4. S/p MitraClip (Feb 2022) There are a couple jets of MR, the most  prominent is very eccentric, directed anteriorly along wall of left  atrium. Overall MR is probable moderate in severity Compared to echo from  March 2022, no significant change . The  mitral valve has been repaired/replaced.   5. Tricuspid valve regurgitation is mild to moderate.   6. Aortic insufficiency is originates centrally and is directed  posteriorly into LVOT and LV. Marland Kitchen The aortic valve is abnormal. Aortic valve  regurgitation is not visualized. Aortic valve sclerosis is present, with  no evidence of aortic valve stenosis.    7. Pulmonic valve regurgitation is moderate.   8. Aortic dilatation noted. There is borderline dilatation of the aortic  root, measuring 39 mm. There is mild dilatation of the ascending aorta,  measuring 42 mm.   9. The inferior vena cava is normal in size with greater than 50%  respiratory variability, suggesting right atrial pressure of 3 mmHg.   Recent Labs: 06/20/2021: TSH 2.190   Recent Lipid Panel No results found for: "CHOL", "TRIG", "HDL", "CHOLHDL", "VLDL", "LDLCALC", "LDLDIRECT"  Physical Exam:   VS:  BP (!) 130/58   Pulse 63   Ht '5\' 9"'$  (1.753 m)   Wt 186 lb 9.6 oz (84.6 kg)   SpO2 97%   BMI 27.56 kg/m    Wt Readings from Last 3 Encounters:  06/18/22 186 lb 9.6 oz (84.6 kg)  01/05/22 182 lb 6.4 oz (82.7 kg)  06/20/21 179 lb 9.6 oz (81.5 kg)    General: Well nourished, well developed, in no acute distress Head: Atraumatic, normal size  Eyes: PEERLA, EOMI  Neck: Supple, no JVD Endocrine: No thryomegaly Cardiac: Normal S1, S2; RRR; soft 2 out of 6 holosystolic murmur Lungs: Clear to auscultation bilaterally, no wheezing, rhonchi or rales  Abd: Soft, nontender, no hepatomegaly  Ext: No edema, pulses 2+ Musculoskeletal: No deformities, BUE and BLE strength normal and equal Skin: Warm and dry, no rashes   Neuro: Alert and oriented to person, place, time, and situation, CNII-XII grossly intact, no focal deficits  Psych: Normal mood and affect   ASSESSMENT:   Ryan Mendoza is a 88 y.o. male who presents for the following: 1. Bradycardia   2. S/P mitral valve clip implantation   3. Severe mitral insufficiency   4. Essential hypertension   5. Nonrheumatic aortic valve insufficiency   6. Aortic root dilation (HCC)     PLAN:   1. Bradycardia -Average heart rate around 57 on recent heart monitor.  No significant bradycardia.  We will continue to monitor this.  He has  no symptoms of dizziness or lightheadedness.  No indication for pacing at this time.  2. S/P mitral  valve clip implantation 3. Severe mitral insufficiency -Severe mitral valve regurgitation status post clip.  Everything stable.  Continue aspirin 81 daily.  Needs antibiotics before dental work.  4. Essential hypertension -Well-controlled.  5. Nonrheumatic aortic valve insufficiency -Mild on most recent echo.  We will check an echo when he sees me back in 1 year.  6. Aortic root dilation (HCC) -Aorta is likely at the upper limits of normal for his age.  We will get a good look at this on echo and follow with thi.   Disposition: Return in about 1 year (around 06/19/2023).  Medication Adjustments/Labs and Tests Ordered: Current medicines are reviewed at length with the patient today.  Concerns regarding medicines are outlined above.  Orders Placed This Encounter  Procedures   ECHOCARDIOGRAM COMPLETE   No orders of the defined types were placed in this encounter.   Patient Instructions  Medication Instructions:  The current medical regimen is effective;  continue present plan and medications.  *If you need a refill on your cardiac medications before your next appointment, please call your pharmacy*   Testing/Procedures: Echocardiogram (12 month) - Your physician has requested that you have an echocardiogram. Echocardiography is a painless test that uses sound waves to create images of your heart. It provides your doctor with information about the size and shape of your heart and how well your heart's chambers and valves are working. This procedure takes approximately one hour. There are no restrictions for this procedure.     Follow-Up: At [facility], you and your health needs are our priority.  As part of our continuing mission to provide you with exceptional heart care, we have created designated Provider Care Teams.  These Care Teams include your primary Cardiologist (physician) and Advanced Practice Providers (APPs -  Physician Assistants and Nurse Practitioners) who all work  together to provide you with the care you need, when you need it.  We recommend signing up for the patient portal called "MyChart".  Sign up information is provided on this After Visit Summary.  MyChart is used to connect with patients for Virtual Visits (Telemedicine).  Patients are able to view lab/test results, encounter notes, upcoming appointments, etc.  Non-urgent messages can be sent to your provider as well.   To learn more about what you can do with MyChart, go to [email or web address].    Your next appointment:   12 month(s)  The format for your next appointment:   In Person  Provider:   Eleonore Chiquito, MD            Time Spent with Patient: I have spent a total of 25 minutes with patient reviewing hospital notes, telemetry, EKGs, labs and examining the patient as well as establishing an assessment and plan that was discussed with the patient.  > 50% of time was spent in direct patient care.  Signed, Addison Naegeli. Audie Box, MD, [facility]  [address] [place], [place] [address] [phone]  06/18/2022 3:12 PM

## 2022-06-18 ENCOUNTER — Encounter: Payer: Self-pay | Admitting: Cardiovascular Disease

## 2022-06-18 ENCOUNTER — Ambulatory Visit: Payer: Medicare Other | Admitting: Cardiovascular Disease

## 2022-06-18 VITALS — BP 130/58 | HR 63 | Ht 69.0 in | Wt 186.6 lb

## 2022-06-18 DIAGNOSIS — I10 Essential (primary) hypertension: Secondary | ICD-10-CM

## 2022-06-18 DIAGNOSIS — I340 Nonrheumatic mitral (valve) insufficiency: Secondary | ICD-10-CM | POA: Diagnosis not present

## 2022-06-18 DIAGNOSIS — I351 Nonrheumatic aortic (valve) insufficiency: Secondary | ICD-10-CM

## 2022-06-18 DIAGNOSIS — Z9889 Other specified postprocedural states: Secondary | ICD-10-CM | POA: Diagnosis not present

## 2022-06-18 DIAGNOSIS — I77810 Thoracic aortic ectasia: Secondary | ICD-10-CM

## 2022-06-18 DIAGNOSIS — R001 Bradycardia, unspecified: Secondary | ICD-10-CM | POA: Diagnosis not present

## 2022-06-18 DIAGNOSIS — Z95818 Presence of other cardiac implants and grafts: Secondary | ICD-10-CM

## 2022-06-18 NOTE — Patient Instructions (Signed)
Medication Instructions:  The current medical regimen is effective;  continue present plan and medications.  *If you need a refill on your cardiac medications before your next appointment, please call your pharmacy*   Testing/Procedures: Echocardiogram (12 month) - Your physician has requested that you have an echocardiogram. Echocardiography is a painless test that uses sound waves to create images of your heart. It provides your doctor with information about the size and shape of your heart and how well your heart's chambers and valves are working. This procedure takes approximately one hour. There are no restrictions for this procedure.     Follow-Up: At [facility], you and your health needs are our priority.  As part of our continuing mission to provide you with exceptional heart care, we have created designated Provider Care Teams.  These Care Teams include your primary Cardiologist (physician) and Advanced Practice Providers (APPs -  Physician Assistants and Nurse Practitioners) who all work together to provide you with the care you need, when you need it.  We recommend signing up for the patient portal called "MyChart".  Sign up information is provided on this After Visit Summary.  MyChart is used to connect with patients for Virtual Visits (Telemedicine).  Patients are able to view lab/test results, encounter notes, upcoming appointments, etc.  Non-urgent messages can be sent to your provider as well.   To learn more about what you can do with MyChart, go to [email or web address].    Your next appointment:   12 month(s)  The format for your next appointment:   In Person  Provider:   Eleonore Chiquito, MD

## 2022-09-16 ENCOUNTER — Other Ambulatory Visit: Payer: Self-pay | Admitting: Cardiovascular Disease

## 2023-04-06 ENCOUNTER — Other Ambulatory Visit: Payer: Self-pay | Admitting: Cardiovascular Disease

## 2023-05-17 ENCOUNTER — Other Ambulatory Visit: Payer: Self-pay

## 2023-05-17 DIAGNOSIS — I351 Nonrheumatic aortic (valve) insufficiency: Secondary | ICD-10-CM

## 2023-05-20 ENCOUNTER — Other Ambulatory Visit: Payer: Self-pay | Admitting: Cardiology

## 2023-05-26 ENCOUNTER — Telehealth: Payer: Self-pay | Admitting: Cardiovascular Disease

## 2023-05-26 NOTE — Telephone Encounter (Signed)
Vicky (dpr) reports that her father has been having some increased chest pain over the past few weeks and he seems to be very tired. The chest pain is Not constant- it comes and goes- it has not been enough to take a NTG. He is not short of breath; no dizziness, blurred vision, no sweating...  She feels like her father needs to be seen appt and the echo moved up if possible?  Appt made for tomorrow 05/27/23 at 4:30 with DOD Dr Wyline Mood; will send message to see if can get Echo moved up.  Given ER precautions. Verbalized understanding.

## 2023-05-26 NOTE — Telephone Encounter (Signed)
Pt c/o of Chest Pain: STAT if CP now or developed within 24 hours  1. Are you having CP right now?   No  2. Are you experiencing any other symptoms (ex. SOB, nausea, vomiting, sweating)?   No  3. How long have you been experiencing CP?   Started a couple of weeks ago  4. Is your CP continuous or coming and going?   Coming and going  5. Have you taken Nitroglycerin?   Daughter did not think so  Daughter stated patient has been under a lot of stress to to family issues.  Daughter stated patient has been feeling very tired . ?

## 2023-05-27 ENCOUNTER — Ambulatory Visit: Payer: Medicare Other | Attending: Internal Medicine | Admitting: Internal Medicine

## 2023-05-27 ENCOUNTER — Encounter: Payer: Self-pay | Admitting: Internal Medicine

## 2023-05-27 VITALS — BP 158/66 | HR 60 | Ht 66.0 in | Wt 180.4 lb

## 2023-05-27 DIAGNOSIS — R079 Chest pain, unspecified: Secondary | ICD-10-CM

## 2023-05-27 NOTE — Progress Notes (Signed)
Cardiology Office Note:    Date:  05/27/2023   ID:  Ryan Mendoza, DOB 08/23/1934, MRN [id number]  PCP:  Whyte, Thomas M, MD   [facility] Providers Cardiologist:  None     Referring MD: Whyte, Thomas M, MD   No chief complaint on file. CP  History of Present Illness:   Acute visit Ryan Mendoza is a 89 y.o. male with a hx of f severe MR s/p clip 01/23/2021, HTN, HLD, CKD 3, CAD s/p PCI to the mid LAD and mid Lcx/ LHC 12/2020 50% ISR LCx . Patient of Dr. O'Neal. He is coming in reported persistent chest pressure. No clear provocation. He takes his nitro and this helps. He is very tired. Notes L arm pain/tingeling. It wakes him up at night. He states he's been SOB for 3 weeks. No orthopnea/PND or LE edema. This has been going on for a few weeks.   Last crt 1.7/GFR 39.    Past Medical History:  Diagnosis Date   Anxiety    Aortic valve regurgitation    Arthritis    hands and knees   BPH (benign prostatic hyperplasia)    Bradycardia 11/22/2019   Cataract, left eye    Claustrophobia    Coronary artery disease    Coronary artery disease involving native coronary artery of native heart with angina pectoris (HCC) 12/08/2018   PTCA and stenting of LAD and circumflex in 2017  Formatting of this note might be different from the original. PTCA and drug-eluting stent to LAD as well as the circumflex in June 2017   Depression    no current meds for   Elevated cholesterol    Essential hypertension 06/19/2019   Gallstones    GERD (gastroesophageal reflux disease)    History of kidney stones    Hyperparathyroidism, primary (HCC) 09/27/2017   Hypertension    Hypothyroidism    Mitral regurgitation 12/08/2018   Mild to moderate by echo from August 2019   Myocardial infarction (HCC)    SILENT   Obstructive sleep apnea 12/08/2018   Primary hyperparathyroidism (HCC)    S/P mitral valve clip implantation 01/23/2021   s/p TEER using a single G4 NT W MitraClip device, placed on the  lateral side of A2/P2 with Dr. Cooper   Scab    below right elbow healing well patient stated hurt working on lawn mower   Status post coronary artery stent placement 12/21/2017   Formatting of this note might be different from the original. LAD and Cx 2017   Tinnitus    both ears all the time    Past Surgical History:  Procedure Laterality Date   BUBBLE STUDY  10/11/2020   Procedure: BUBBLE STUDY;  Surgeon: Ross, Paula V, MD;  [facility];  Service: Cardiovascular;;   CATARACT EXTRACTION W/PHACO Left 04/12/2018   Procedure: CATARACT EXTRACTION PHACO AND INTRAOCULAR LENS PLACEMENT ([facility]);  Surgeon: Porfilio, William, MD;  Location: [facility];  Service: Ophthalmology;  Laterality: Left;  US 00:39.5 AP% 16.7 CDE 6.62 Fluid Pack Lot # [id number]   CORONARY ANGIOPLASTY     2017   EYE SURGERY Right    ioc for cataract   MITRAL VALVE REPAIR N/A 01/23/2021   Procedure: MITRAL VALVE REPAIR;  Surgeon: Cooper, Michael, MD;  [facility];  Service: Cardiovascular;  Laterality: N/A;   PARATHYROIDECTOMY Right 09/28/2017   Procedure: RIGHT INFERIOR PARATHYROIDECTOMY;  Surgeon: Gerkin, Todd, MD;  [facility];  Service: General;  Laterality:   Right;   right eye  plug for tear duct surgery     RIGHT/LEFT HEART CATH AND CORONARY ANGIOGRAPHY N/A 01/10/2021   Procedure: RIGHT/LEFT HEART CATH AND CORONARY ANGIOGRAPHY;  Surgeon: Cooper, Michael, MD;  [facility];  Service: Cardiovascular;  Laterality: N/A;   stents to heart x 2  06/2016   TEE WITHOUT CARDIOVERSION N/A 10/11/2020   Procedure: TRANSESOPHAGEAL ECHOCARDIOGRAM (TEE);  Surgeon: Ross, Paula V, MD;  [facility];  Service: Cardiovascular;  Laterality: N/A;   TEE WITHOUT CARDIOVERSION N/A 01/23/2021   Procedure: TRANSESOPHAGEAL ECHOCARDIOGRAM (TEE);  Surgeon: Cooper, Michael, MD;  [facility];  Service: Cardiovascular;  Laterality: N/A;   TONSILLECTOMY     age 8    Current  Medications: No outpatient medications have been marked as taking for the 05/27/23 encounter (Appointment) with Paulino Cork E, MD.     Allergies:   Patient has no known allergies.   Social History   Socioeconomic History   Marital status: Married    Spouse name: Not on file   Number of children: 3   Years of education: Not on file   Highest education level: Not on file  Occupational History   Not on file  Tobacco Use   Smoking status: Former    Packs/day: 1.00    Years: 10.00    Additional pack years: 0.00    Total pack years: 10.00    Types: Cigarettes   Smokeless tobacco: Never   Tobacco comments:    quit 1974  Vaping Use   Vaping Use: Never used  Substance and Sexual Activity   Alcohol use: No   Drug use: No   Sexual activity: Not on file  Other Topics Concern   Not on file  Social History Narrative   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  ROS:   Please see the history of present illness.     All other systems reviewed and are negative.  EKGs/Labs/Other Studies Reviewed:    The following studies were reviewed today:   EKG:  EKG is  ordered today.  The ekg ordered today demonstrates   05/27/2023- NSR, no ischemic changes  Recent Labs: No results found for requested labs within last 365 days.   Recent Lipid Panel No results found for: "CHOL", "TRIG", "HDL", "CHOLHDL", "VLDL", "LDLCALC", "LDLDIRECT"   Risk Assessment/Calculations:    Physical Exam:    VS:   Vitals:   05/27/23 1625  BP: (!) 158/66  Pulse: 60  SpO2: 96%     Wt Readings from Last 3 Encounters:  06/18/22 186 lb 9.6  oz (84.6 kg)  01/05/22 182 lb 6.4 oz (82.7 kg)  06/20/21 179 lb 9.6 oz (81.5 kg)     GEN:  Well nourished, well developed in no acute distress HEENT: Normal NECK: No JVD; CARDIAC: RRR, no murmurs, rubs, gallops RESPIRATORY:  Clear to auscultation without rales, wheezing or rhonchi  ABDOMEN: Soft, non-tender, non-distended MUSCULOSKELETAL:  No edema; No deformity  SKIN: Warm and dry NEUROLOGIC:  Alert and oriented x 3 PSYCHIATRIC:  Normal affect     ASSESSMENT:   Acute Issue Addressed: Angina: prior hx of PCI with ISR 50% 12/2020. This may have progressed.  Medical management limited with sinus bradycardia, not on a BB. Continue asa 81 mg daily.  If no significant lesion can start imdur for medical management. PLAN:    In order of problems listed above:  LHC TTE pending in July FU with Dr. O'Neal       Informed Consent   Shared Decision Making/Informed Consent The risks [stroke (1 in 1000), death (1 in 1000), kidney failure [usually temporary] (1 in 500), bleeding (1 in 200), allergic reaction [possibly serious] (1 in 200)], benefits (diagnostic support and management of coronary artery disease) and alternatives of a cardiac catheterization were discussed in detail with Mr. Sampley and he is willing to proceed.       Medication Adjustments/Labs and Tests Ordered: Current medicines are reviewed at length with the patient today.  Concerns regarding medicines are outlined above.  No orders of the defined types were placed in this encounter.  No orders of the defined types were placed in this encounter.   There are no Patient Instructions on file for this visit.   Signed, Kaylub Detienne E, MD  05/27/2023 12:35 PM    [facility] 

## 2023-05-27 NOTE — Patient Instructions (Signed)
Medication Instructions:  No medication changes *If you need a refill on your cardiac medications before your next appointment, please call your pharmacy*   Lab Work: Please come in tomorrow for blood work  If you have labs (blood work) drawn today and your tests are completely normal, you will receive your results only by: MyChart Message (if you have MyChart) OR A paper copy in the mail If you have any lab test that is abnormal or we need to change your treatment, we will call you to review the results.   Testing/Procedures:   [place] [facility] DEPT OF [facility] AT [facility] [address] [place] [id number] [place] [address] Dept: [phone] Loc: [phone]  Ryan Mendoza  05/27/2023  You are scheduled for a Cardiac Catheterization on Tuesday, June 14 with Dr. Bryan Lemma.  1. Please arrive at the [facility] (Main Entrance A) at [facility]: [address] [place], [place] [address] at 5:30 AM (This time is 2 hour(s) before your procedure to ensure your preparation). Free valet parking service is available. You will check in at ADMITTING. The support person will be asked to wait in the waiting room.  It is OK to have someone drop you off and come back when you are ready to be discharged.    Special note: Every effort is made to have your procedure done on time. Please understand that emergencies sometimes delay scheduled procedures.  2. Diet: Do not eat solid foods after midnight.  The patient may have clear liquids until 5am upon the day of the procedure.  3. Labs: You will need to have blood drawn on Friday, June 14 at [facility] [address], [place]  Open: 8am - 5pm (Lunch 12:30 - 1:30)   Phone: [phone]. You do not need to be fasting.  4. Medication instructions in preparation for your procedure:   Contrast Allergy: No  Stop taking, Lasix (Furosemide)  Tuesday, June  14,   On the morning of your procedure, take your Aspirin 81 mg and any morning medicines NOT listed above.  You may use sips of water.  5. Plan to go home the same day, you will only stay overnight if medically necessary. 6. Bring a current list of your medications and current insurance cards. 7. You MUST have a responsible person to drive you home. 8. Someone MUST be with you the first 24 hours after you arrive home or your discharge will be delayed. 9. Please wear clothes that are easy to get on and off and wear slip-on shoes.  Thank you for allowing [place] to care for you!   -- [name]    Follow-Up: At [facility], you and your health needs are our priority.  As part of our continuing mission to provide you with exceptional heart care, we have created designated Provider Care Teams.  These Care Teams include your primary Cardiologist (physician) and Advanced Practice Providers (APPs -  Physician Assistants and Nurse Practitioners) who all work together to provide you with the care you need, when you need it.  We recommend signing up for the patient portal called "MyChart".  Sign up information is provided on this After Visit Summary.  MyChart is used to connect with patients for Virtual Visits (Telemedicine).  Patients are able to view lab/test results, encounter notes, upcoming appointments, etc.  Non-urgent messages can be sent to your provider as well.  To learn more about what you can do with MyChart, go to [email or web address].    Your next appointment:   6 month(s)  Provider:   Dr. Scharlene Gloss

## 2023-05-27 NOTE — H&P (View-Only) (Signed)
Cardiology Office Note:    Date:  05/27/2023   ID:  KREG PITSENBARGER, DOB 1934/06/22, MRN [id number]  PCP:  Everlean Cherry, MD   [facility] Providers Cardiologist:  None     Referring MD: Everlean Cherry, MD   No chief complaint on file. CP  History of Present Illness:   Acute visit Ryan Mendoza is a 89 y.o. male with a hx of f severe MR s/p clip 01/23/2021, HTN, HLD, CKD 3, CAD s/p PCI to the mid LAD and mid Lcx/ LHC 12/2020 50% ISR LCx . Patient of Dr. Flora Lipps. He is coming in reported persistent chest pressure. No clear provocation. He takes his nitro and this helps. He is very tired. Notes L arm pain/tingeling. It wakes him up at night. He states he's been SOB for 3 weeks. No orthopnea/PND or LE edema. This has been going on for a few weeks.   Last crt 1.7/GFR 39.    Past Medical History:  Diagnosis Date   Anxiety    Aortic valve regurgitation    Arthritis    hands and knees   BPH (benign prostatic hyperplasia)    Bradycardia 11/22/2019   Cataract, left eye    Claustrophobia    Coronary artery disease    Coronary artery disease involving native coronary artery of native heart with angina pectoris (HCC) 12/08/2018   PTCA and stenting of LAD and circumflex in 2017  Formatting of this note might be different from the original. PTCA and drug-eluting stent to LAD as well as the circumflex in June 2017   Depression    no current meds for   Elevated cholesterol    Essential hypertension 06/19/2019   Gallstones    GERD (gastroesophageal reflux disease)    History of kidney stones    Hyperparathyroidism, primary (HCC) 09/27/2017   Hypertension    Hypothyroidism    Mitral regurgitation 12/08/2018   Mild to moderate by echo from August 2019   Myocardial infarction [facility])    SILENT   Obstructive sleep apnea 12/08/2018   Primary hyperparathyroidism (HCC)    S/P mitral valve clip implantation 01/23/2021   s/p TEER using a single G4 NT W MitraClip device, placed on the  lateral side of A2/P2 with Dr. Maximino Greenland    below right elbow healing well patient stated hurt working on lawn mower   Status post coronary artery stent placement 12/21/2017   Formatting of this note might be different from the original. LAD and Cx 2017   Tinnitus    both ears all the time    Past Surgical History:  Procedure Laterality Date   BUBBLE STUDY  10/11/2020   Procedure: BUBBLE STUDY;  Surgeon: Pricilla Riffle, MD;  Location[facility] ENDOSCOPY;  Service: Cardiovascular;;   CATARACT EXTRACTION W/PHACO Left 04/12/2018   Procedure: CATARACT EXTRACTION PHACO AND INTRAOCULAR LENS PLACEMENT (IOC);  Surgeon: Galen Manila, MD;  Location: [facility] ORS;  Service: Ophthalmology;  Laterality: Left;  [place] 00:39.5 AP% 16.7 CDE 6.62 Fluid Pack Lot # [id number]   CORONARY ANGIOPLASTY     2017   EYE SURGERY Right    ioc for cataract   MITRAL VALVE REPAIR N/A 01/23/2021   Procedure: MITRAL VALVE REPAIR;  Surgeon: Tonny Bollman, MD;  Location: [facility] INVASIVE CV LAB;  Service: Cardiovascular;  Laterality: N/A;   PARATHYROIDECTOMY Right 09/28/2017   Procedure: RIGHT INFERIOR PARATHYROIDECTOMY;  Surgeon: Darnell Level, MD;  Location: [facility] ORS;  Service: General;  Laterality:  Right;   right eye  plug for tear duct surgery     RIGHT/LEFT HEART CATH AND CORONARY ANGIOGRAPHY N/A 01/10/2021   Procedure: RIGHT/LEFT HEART CATH AND CORONARY ANGIOGRAPHY;  Surgeon: Tonny Bollman, MD;  Location: [facility] INVASIVE CV LAB;  Service: Cardiovascular;  Laterality: N/A;   stents to heart x 2  06/2016   TEE WITHOUT CARDIOVERSION N/A 10/11/2020   Procedure: TRANSESOPHAGEAL ECHOCARDIOGRAM (TEE);  Surgeon: Pricilla Riffle, MD;  Location: [facility] ENDOSCOPY;  Service: Cardiovascular;  Laterality: N/A;   TEE WITHOUT CARDIOVERSION N/A 01/23/2021   Procedure: TRANSESOPHAGEAL ECHOCARDIOGRAM (TEE);  Surgeon: Tonny Bollman, MD;  Location: [facility] INVASIVE CV LAB;  Service: Cardiovascular;  Laterality: N/A;   TONSILLECTOMY     age 34    Current  Medications: No outpatient medications have been marked as taking for the 05/27/23 encounter (Appointment) with Maisie Fus, MD.     Allergies:   Patient has no known allergies.   Social History   Socioeconomic History   Marital status: Married    Spouse name: Not on file   Number of children: 3   Years of education: Not on file   Highest education level: Not on file  Occupational History   Not on file  Tobacco Use   Smoking status: Former    Packs/day: 1.00    Years: 10.00    Additional pack years: 0.00    Total pack years: 10.00    Types: Cigarettes   Smokeless tobacco: Never   Tobacco comments:    quit 1974  Vaping Use   Vaping Use: Never used  Substance and Sexual Activity   Alcohol use: No   Drug use: No   Sexual activity: Not on file  Other Topics Concern   Not on file  Social History Narrative   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  ROS:   Please see the history of present illness.     All other systems reviewed and are negative.  EKGs/Labs/Other Studies Reviewed:    The following studies were reviewed today:   EKG:  EKG is  ordered today.  The ekg ordered today demonstrates   05/27/2023- NSR, no ischemic changes  Recent Labs: No results found for requested labs within last 365 days.   Recent Lipid Panel No results found for: "CHOL", "TRIG", "HDL", "CHOLHDL", "VLDL", "LDLCALC", "LDLDIRECT"   Risk Assessment/Calculations:    Physical Exam:    VS:   Vitals:   05/27/23 1625  BP: (!) 158/66  Pulse: 60  SpO2: 96%     Wt Readings from Last 3 Encounters:  06/18/22 186 lb 9.6  oz (84.6 kg)  01/05/22 182 lb 6.4 oz (82.7 kg)  06/20/21 179 lb 9.6 oz (81.5 kg)     GEN:  Well nourished, well developed in no acute distress HEENT: Normal NECK: No JVD; CARDIAC: RRR, no murmurs, rubs, gallops RESPIRATORY:  Clear to auscultation without rales, wheezing or rhonchi  ABDOMEN: Soft, non-tender, non-distended MUSCULOSKELETAL:  No edema; No deformity  SKIN: Warm and dry NEUROLOGIC:  Alert and oriented x 3 PSYCHIATRIC:  Normal affect  ASSESSMENT:   Acute Issue Addressed: Angina: prior hx of PCI with ISR 50% 12/2020. This may have progressed.  Medical management limited with sinus bradycardia, not on a BB. Continue asa 81 mg daily.  If no significant lesion can start imdur for medical management. PLAN:    In order of problems listed above:  LHC TTE pending in July FU with Dr. Flora Lipps       Informed Consent   Shared Decision Making/Informed Consent The risks [stroke (1 in 1000), death (1 in 1000), kidney failure [usually temporary] (1 in 500), bleeding (1 in 200), allergic reaction [possibly serious] (1 in 200)], benefits (diagnostic support and management of coronary artery disease) and alternatives of a cardiac catheterization were discussed in detail with Mr. Neer and he is willing to proceed.       Medication Adjustments/Labs and Tests Ordered: Current medicines are reviewed at length with the patient today.  Concerns regarding medicines are outlined above.  No orders of the defined types were placed in this encounter.  No orders of the defined types were placed in this encounter.   There are no Patient Instructions on file for this visit.   Signed, Maisie Fus, MD  05/27/2023 12:35 PM    [facility]

## 2023-05-29 LAB — BASIC METABOLIC PANEL
BUN/Creatinine Ratio: 16 (ref 10–24)
BUN: 25 mg/dL (ref 8–27)
CO2: 26 mmol/L (ref 20–29)
Calcium: 9.3 mg/dL (ref 8.6–10.2)
Chloride: 103 mmol/L (ref 96–106)
Creatinine, Ser: 1.61 mg/dL — ABNORMAL HIGH (ref 0.76–1.27)
Glucose: 84 mg/dL (ref 70–99)
Potassium: 4.4 mmol/L (ref 3.5–5.2)
Sodium: 141 mmol/L (ref 134–144)
eGFR: 41 mL/min/{1.73_m2} — ABNORMAL LOW (ref >59)

## 2023-05-29 LAB — CBC
Hematocrit: 44 % (ref 37.5–51.0)
Hemoglobin: 14.6 g/dL (ref 13.0–17.7)
MCH: 29.2 pg (ref 26.6–33.0)
MCHC: 33.2 g/dL (ref 31.5–35.7)
MCV: 88 fL (ref 79–97)
Platelets: 185 10*3/uL (ref 150–450)
RBC: 5 x10E6/uL (ref 4.14–5.80)
RDW: 13.4 % (ref 11.6–15.4)
WBC: 7.6 10*3/uL (ref 3.4–10.8)

## 2023-05-31 ENCOUNTER — Telehealth: Payer: Self-pay | Admitting: *Deleted

## 2023-05-31 NOTE — Telephone Encounter (Signed)
Cardiac Catheterization scheduled at [facility] for: Tuesday June 01, 2023 10:30 AM Arrival time [facility] Main Entrance A at: 5:30 AM-pre-procedure hydration  Nothing to eat after midnight prior to procedure, clear liquids until 5 AM day of procedure.  Medication instructions: -Hold:  Lasix-day before and day of procedure-per protocol GFR 41 -Other usual morning medications can be taken with sips of water including aspirin 81 mg.  Confirmed patient has responsible adult to drive home post procedure and be with patient first 24 hours after arriving home.  Plan to go home the same day, you will only stay overnight if medically necessary.  Reviewed procedure instructions, pre-procedure hydration with patient's daughter, Vickie(DPR).

## 2023-06-01 ENCOUNTER — Encounter (HOSPITAL_COMMUNITY)
Admission: RE | Disposition: A | Discharge status: Home / Self Care | Payer: Self-pay | Source: Home / Self Care | Attending: Cardiology

## 2023-06-01 ENCOUNTER — Ambulatory Visit (HOSPITAL_COMMUNITY)
Admission: RE | Admit: 2023-06-01 | Discharge: 2023-06-01 | Disposition: A | Discharge status: Home / Self Care | Payer: Medicare Other | Attending: Cardiology | Admitting: Cardiology

## 2023-06-01 DIAGNOSIS — Z87891 Personal history of nicotine dependence: Secondary | ICD-10-CM | POA: Insufficient documentation

## 2023-06-01 DIAGNOSIS — R5382 Chronic fatigue, unspecified: Secondary | ICD-10-CM | POA: Diagnosis not present

## 2023-06-01 DIAGNOSIS — I129 Hypertensive chronic kidney disease with stage 1 through stage 4 chronic kidney disease, or unspecified chronic kidney disease: Secondary | ICD-10-CM | POA: Diagnosis not present

## 2023-06-01 DIAGNOSIS — N183 Chronic kidney disease, stage 3 unspecified: Secondary | ICD-10-CM | POA: Diagnosis not present

## 2023-06-01 DIAGNOSIS — E785 Hyperlipidemia, unspecified: Secondary | ICD-10-CM | POA: Diagnosis not present

## 2023-06-01 DIAGNOSIS — Z955 Presence of coronary angioplasty implant and graft: Secondary | ICD-10-CM | POA: Diagnosis not present

## 2023-06-01 DIAGNOSIS — I25119 Atherosclerotic heart disease of native coronary artery with unspecified angina pectoris: Secondary | ICD-10-CM | POA: Insufficient documentation

## 2023-06-01 HISTORY — PX: LEFT HEART CATH AND CORONARY ANGIOGRAPHY: CATH118249

## 2023-06-01 SURGERY — LEFT HEART CATH AND CORONARY ANGIOGRAPHY
Anesthesia: LOCAL

## 2023-06-01 MED ORDER — HEPARIN SODIUM (PORCINE) 1000 UNIT/ML IJ SOLN
INTRAMUSCULAR | Status: DC | PRN
  Administered 2023-06-01: 4000 [IU] via INTRAVENOUS

## 2023-06-01 MED ORDER — SODIUM CHLORIDE 0.9 % IV SOLN: INTRAVENOUS | Status: DC

## 2023-06-01 MED ORDER — ISOSORBIDE MONONITRATE ER 30 MG PO TB24: 30.0000 mg | ORAL_TABLET | Freq: Every day | ORAL | 11 refills | Status: DC

## 2023-06-01 MED ORDER — SODIUM CHLORIDE 0.9 % IV SOLN: 250.0000 mL | INTRAVENOUS | Status: DC | PRN

## 2023-06-01 MED ORDER — SODIUM CHLORIDE 0.9% FLUSH: 3.0000 mL | Freq: Two times a day (BID) | INTRAVENOUS | Status: DC

## 2023-06-01 MED ORDER — HYDRALAZINE HCL 20 MG/ML IJ SOLN
INTRAMUSCULAR | Status: AC
  Filled 2023-06-01: qty 1

## 2023-06-01 MED ORDER — VERAPAMIL HCL 2.5 MG/ML IV SOLN
INTRAVENOUS | Status: DC | PRN
  Administered 2023-06-01: 10 mL via INTRA_ARTERIAL

## 2023-06-01 MED ORDER — MIDAZOLAM HCL 2 MG/2ML IJ SOLN
INTRAMUSCULAR | Status: DC | PRN
  Administered 2023-06-01: 1 mg via INTRAVENOUS

## 2023-06-01 MED ORDER — HEPARIN SODIUM (PORCINE) 1000 UNIT/ML IJ SOLN
INTRAMUSCULAR | Status: AC
  Filled 2023-06-01: qty 10

## 2023-06-01 MED ORDER — ACETAMINOPHEN 325 MG PO TABS: 650.0000 mg | ORAL_TABLET | ORAL | Status: DC | PRN

## 2023-06-01 MED ORDER — HYDRALAZINE HCL 20 MG/ML IJ SOLN
INTRAMUSCULAR | Status: DC | PRN
  Administered 2023-06-01: 10 mg via INTRAVENOUS

## 2023-06-01 MED ORDER — ONDANSETRON HCL 4 MG/2ML IJ SOLN: 4.0000 mg | Freq: Four times a day (QID) | INTRAMUSCULAR | Status: DC | PRN

## 2023-06-01 MED ORDER — VERAPAMIL HCL 2.5 MG/ML IV SOLN
INTRAVENOUS | Status: AC
  Filled 2023-06-01: qty 2

## 2023-06-01 MED ORDER — LABETALOL HCL 5 MG/ML IV SOLN: 10.0000 mg | INTRAVENOUS | Status: DC | PRN

## 2023-06-01 MED ORDER — FENTANYL CITRATE (PF) 100 MCG/2ML IJ SOLN
INTRAMUSCULAR | Status: AC
  Filled 2023-06-01: qty 2

## 2023-06-01 MED ORDER — IOHEXOL 350 MG/ML SOLN
INTRAVENOUS | Status: DC | PRN
  Administered 2023-06-01: 60 mL

## 2023-06-01 MED ORDER — SODIUM CHLORIDE 0.9% FLUSH: 3.0000 mL | INTRAVENOUS | Status: DC | PRN

## 2023-06-01 MED ORDER — SODIUM CHLORIDE 0.9 % WEIGHT BASED INFUSION
3.0000 mL/kg/h | INTRAVENOUS | Status: AC
  Administered 2023-06-01: 3 mL/kg/h via INTRAVENOUS

## 2023-06-01 MED ORDER — FENTANYL CITRATE (PF) 100 MCG/2ML IJ SOLN
INTRAMUSCULAR | Status: DC | PRN
  Administered 2023-06-01: 25 ug via INTRAVENOUS

## 2023-06-01 MED ORDER — SODIUM CHLORIDE 0.9 % WEIGHT BASED INFUSION: 1.0000 mL/kg/h | INTRAVENOUS | Status: DC

## 2023-06-01 MED ORDER — MIDAZOLAM HCL 2 MG/2ML IJ SOLN
INTRAMUSCULAR | Status: AC
  Filled 2023-06-01: qty 2

## 2023-06-01 MED ORDER — HYDRALAZINE HCL 20 MG/ML IJ SOLN: 10.0000 mg | INTRAMUSCULAR | Status: DC | PRN

## 2023-06-01 MED ORDER — ASPIRIN 81 MG PO CHEW: 81.0000 mg | CHEWABLE_TABLET | ORAL | Status: AC

## 2023-06-01 MED ORDER — LIDOCAINE HCL (PF) 1 % IJ SOLN
INTRAMUSCULAR | Status: AC
  Filled 2023-06-01: qty 30

## 2023-06-01 MED ORDER — LIDOCAINE HCL (PF) 1 % IJ SOLN
INTRAMUSCULAR | Status: DC | PRN
  Administered 2023-06-01: 5 mL via INTRADERMAL

## 2023-06-01 MED ORDER — HEPARIN (PORCINE) IN NACL 1000-0.9 UT/500ML-% IV SOLN
INTRAVENOUS | Status: DC | PRN
  Administered 2023-06-01 (×2): 500 mL

## 2023-06-01 SURGICAL SUPPLY — 12 items
CATH INFINITI 5FR ANG PIGTAIL (CATHETERS) IMPLANT
CATH OPTITORQUE TIG 4.0 5F (CATHETERS) IMPLANT
DEVICE RAD COMP TR BAND LRG (VASCULAR PRODUCTS) IMPLANT
GLIDESHEATH SLEND SS 6F .021 (SHEATH) IMPLANT
GUIDEWIRE INQWIRE 1.5J.035X260 (WIRE) IMPLANT
INQWIRE 1.5J .035X260CM (WIRE) ×1
KIT HEART LEFT (KITS) ×1 IMPLANT
PACK CARDIAC CATHETERIZATION (CUSTOM PROCEDURE TRAY) ×1 IMPLANT
SHEATH PROBE COVER 6X72 (BAG) IMPLANT
TRANSDUCER W/STOPCOCK (MISCELLANEOUS) ×1 IMPLANT
TUBING CIL FLEX 10 FLL-RA (TUBING) ×1 IMPLANT
WIRE HI TORQ VERSACORE-J 145CM (WIRE) IMPLANT

## 2023-06-01 NOTE — Interval H&P Note (Signed)
History and Physical Interval Note:  06/01/2023 11:38 AM  Ryan Mendoza  has presented today for surgery, with the diagnosis of chest pain & known CAD.  The various methods of treatment have been discussed with the patient and family. After consideration of risks, benefits and other options for treatment, the patient has consented to  Procedure(s): LEFT HEART CATH AND CORONARY ANGIOGRAPHY (N/A) PERCUTANEOUS CORONARY ARTERY    as a surgical intervention.  The patient's history has been reviewed, patient examined, no change in status, stable for surgery.  I have reviewed the patient's chart and labs.  Questions were answered to the patient's satisfaction.     Cath Lab Visit (complete for each Cath Lab visit)  Clinical Evaluation Leading to the Procedure:   ACS: No.  Non-ACS:    Anginal Classification: CCS III  Anti-ischemic medical therapy: Minimal Therapy (1 class of medications)  Non-Invasive Test Results: No non-invasive testing performed  Prior CABG: No previous CABG    Bryan Lemma

## 2023-06-01 NOTE — CV Procedure (Signed)
  SUMMARY Stable CAD - no change from 2022 1. Patent left main with no significant stenosis (~20% dLM) 2. Patent LAD stent with mild nonobstructive plaquing in the LAD - tapers to small caliber vessel distally 3. Patent left circumflex stent with 50% diffuse in-stent restenosis and 50-60% ostial circumflex stenosis, no high-grade disease throughout the circumflex distribution (no notable change from prior cath) 4. Diffuse nonobstructive RCA stenosis (large dominant vessel)

## 2023-06-01 NOTE — Discharge Instructions (Signed)

## 2023-06-02 ENCOUNTER — Encounter (HOSPITAL_COMMUNITY): Payer: Self-pay | Admitting: Cardiology

## 2023-06-23 ENCOUNTER — Ambulatory Visit (HOSPITAL_COMMUNITY): Payer: Medicare Other | Attending: Internal Medicine

## 2023-06-23 DIAGNOSIS — I351 Nonrheumatic aortic (valve) insufficiency: Secondary | ICD-10-CM | POA: Diagnosis not present

## 2023-06-23 LAB — ECHOCARDIOGRAM COMPLETE
Area-P 1/2: 1.61 cm2
MV M vel: 3.11 m/s
MV Peak grad: 38.7 mmHg
MV VTI: 1.96 cm2
P 1/2 time: 616 msec
S' Lateral: 3.6 cm

## 2023-09-23 ENCOUNTER — Other Ambulatory Visit: Payer: Self-pay

## 2023-09-23 MED ORDER — CLONIDINE HCL 0.1 MG PO TABS: 0.1000 mg | ORAL_TABLET | Freq: Two times a day (BID) | ORAL | 2 refills | Status: DC

## 2023-11-25 NOTE — Progress Notes (Signed)
Cardiology Office Note:  .   Date:  11/26/2023  ID:  Ryan Mendoza, DOB Aug 31, 1934, MRN [id number] PCP: Everlean Cherry, MD  [facility] Providers Cardiologist:  None { History of Present Illness: .   Ryan Mendoza is a 89 y.o. male with history of CAD, MR, HTN, HLD who presents for follow-up.    History of Present Illness   Ryan Mendoza, Ryan Mendoza with a history of CAD, mitral valve regurgitation post mitral clip, aortic regurgitation, CKD stage three, and hypertension, presents for a follow-up visit. Several months ago, he was evaluated for unstable angina and underwent a left heart catheterization, which showed stable coronary anatomy. He reports feeling "pretty good" but notes occasional shortness of breath during physical activity. He has been participating in water-based exercises for back pain, which have been beneficial, and is planning to start aerobic exercises soon. He denies any chest pain but mentions feeling "out of wind" when he is very active. His blood pressure was slightly elevated during the visit, but he attributes this to the stress of the visit and traffic. He reports that his blood pressure is usually stable at home. He is also under the care of a nephrologist for kidney issues.          Problem List CAD -06/01/2016: PCI to mid LAD and mid LCX -LHC 05/2023: 50% ISR LCX 2. Severe MR  -prolapsing A1 and flail medial P2 -NTW mitraclip 01/23/2021 -mild to moderate residual MR 3. Mild to moderate AI 4. HTN 5. HLD -T chol 113, LDL 66, HDL 37, TG 97 6. CKD Stage 3b    ROS: All other ROS reviewed and negative. Pertinent positives noted in the HPI.     Studies Reviewed: Marland Kitchen       TTE 06/23/2023  1. Left ventricular ejection fraction, by estimation, is 50 to 55%. The  left ventricle has low normal function. The left ventricle has no regional  wall motion abnormalities. There is mild concentric left ventricular  hypertrophy. Left ventricular  diastolic  parameters are indeterminate.   2. Right ventricular systolic function is normal. The right ventricular  size is normal.   3. Left atrial size was mildly dilated.   4. S/p mitraclip (01/2021), largest jet anteriorly directed. Moderate to  severe mitral valve regurgitation.   5. AI is eccentric. Aortic valve regurgitation is mild to moderate.  Aortic valve sclerosis/calcification is present, without any evidence of  aortic stenosis.   6. Pulmonic valve regurgitation is moderate.   7. There is mild dilatation of the ascending aorta, measuring 41 mm.  Physical Exam:   VS:  BP (!) 173/83   Pulse 68   Ht 5\' 6"  (1.676 m)   Wt 183 lb 3.2 oz (83.1 kg)   SpO2 98%   BMI 29.57 kg/m    Wt Readings from Last 3 Encounters:  11/26/23 183 lb 3.2 oz (83.1 kg)  06/01/23 170 lb (77.1 kg)  05/27/23 180 lb 6.4 oz (81.8 kg)    GEN: Well nourished, well developed in no acute distress NECK: No JVD; No carotid bruits CARDIAC: RRR, 2/6 HSM, rubs, gallops RESPIRATORY:  Clear to auscultation without rales, wheezing or rhonchi  ABDOMEN: Soft, non-tender, non-distended EXTREMITIES:  No edema; No deformity  ASSESSMENT AND PLAN: .   Assessment and Plan    Coronary Artery Disease Stable post left heart catheterization in June 2024 with patent LAD stent and 50% in-stent re-stenosis of the circumflex stent. No further angina reported. -Continue  Aspirin 81mg  daily and Lipitor 40mg  daily. -continue imdur 30 mg daily   Mitral Valve Regurgitation (status post mitral clip) Mild to moderate MR on recent echo, no signs of heart failure. -Continue Lasix 40mg  twice per week. -ASA 81 mg daily. Abx for SBE ppx  Hypertension Elevated BP at today's visit, but patient reports good control at home. -Continue Amlodipine 10mg  daily, Clonidine 0.1mg  BID, and Imdur 30mg  daily. -Monitor BP at home and report if values are consistently high.  Hyperlipidemia LDL goal achieved. -Continue Lipitor.  General Health  Maintenance / Followup Plans -Continue physical activity as tolerated, including water exercises and aerobic activities. -Follow up in 6 months or sooner if any changes in symptoms or BP control.              Follow-up: Return in about 6 months (around 05/26/2024).  Time Spent with Patient: I have spent a total of 25 minutes caring for this patient today face to face, ordering and reviewing labs/tests, reviewing prior records/medical history, examining the patient, establishing Ryan assessment and plan, communicating results/findings to the patient/family, and documenting in the medical record.   Signed, Lenna Gilford. Flora Lipps, MD, [facility]  [facility]  [address], [address] [place], [place] [address] [phone]  3:39 PM

## 2023-11-26 ENCOUNTER — Ambulatory Visit: Payer: Medicare Other | Attending: Cardiovascular Disease | Admitting: Cardiovascular Disease

## 2023-11-26 ENCOUNTER — Encounter: Payer: Self-pay | Admitting: Cardiovascular Disease

## 2023-11-26 VITALS — BP 173/83 | HR 68 | Ht 66.0 in | Wt 183.2 lb

## 2023-11-26 DIAGNOSIS — E782 Mixed hyperlipidemia: Secondary | ICD-10-CM

## 2023-11-26 DIAGNOSIS — I340 Nonrheumatic mitral (valve) insufficiency: Secondary | ICD-10-CM

## 2023-11-26 DIAGNOSIS — I10 Essential (primary) hypertension: Secondary | ICD-10-CM | POA: Diagnosis not present

## 2023-11-26 DIAGNOSIS — I351 Nonrheumatic aortic (valve) insufficiency: Secondary | ICD-10-CM | POA: Diagnosis not present

## 2023-11-26 DIAGNOSIS — I2510 Atherosclerotic heart disease of native coronary artery without angina pectoris: Secondary | ICD-10-CM

## 2023-11-26 DIAGNOSIS — Z9889 Other specified postprocedural states: Secondary | ICD-10-CM

## 2023-11-26 DIAGNOSIS — Z95818 Presence of other cardiac implants and grafts: Secondary | ICD-10-CM

## 2023-11-26 NOTE — Patient Instructions (Signed)
Medication Instructions:  Your physician recommends that you continue on your current medications as directed. Please refer to the Current Medication list given to you today.  *If you need a refill on your cardiac medications before your next appointment, please call your pharmacy*  Lab Work: None  Follow-Up: At [facility], you and your health needs are our priority.  As part of our continuing mission to provide you with exceptional heart care, we have created designated Provider Care Teams.  These Care Teams include your primary Cardiologist (physician) and Advanced Practice Providers (APPs -  Physician Assistants and Nurse Practitioners) who all work together to provide you with the care you need, when you need it.  Your next appointment:   6 month(s)  Provider:   APP (Nurse Practitioner or Physician's Assistant)

## 2023-12-27 ENCOUNTER — Encounter: Payer: Self-pay | Admitting: Cardiovascular Disease

## 2024-05-19 ENCOUNTER — Encounter: Payer: Self-pay | Admitting: Cardiovascular Disease

## 2024-05-31 ENCOUNTER — Other Ambulatory Visit: Payer: Self-pay | Admitting: Cardiology

## 2024-09-28 ENCOUNTER — Other Ambulatory Visit: Payer: Self-pay

## 2024-09-28 ENCOUNTER — Emergency Department (HOSPITAL_COMMUNITY)

## 2024-09-28 ENCOUNTER — Observation Stay (HOSPITAL_COMMUNITY)
Admission: EM | Admit: 2024-09-28 | Discharge: 2024-09-30 | Disposition: A | Discharge status: Home / Self Care | Attending: Internal Medicine | Admitting: Internal Medicine

## 2024-09-28 DIAGNOSIS — I5020 Unspecified systolic (congestive) heart failure: Secondary | ICD-10-CM | POA: Diagnosis not present

## 2024-09-28 DIAGNOSIS — R079 Chest pain, unspecified: Principal | ICD-10-CM | POA: Diagnosis present

## 2024-09-28 DIAGNOSIS — I130 Hypertensive heart and chronic kidney disease with heart failure and stage 1 through stage 4 chronic kidney disease, or unspecified chronic kidney disease: Secondary | ICD-10-CM | POA: Diagnosis not present

## 2024-09-28 DIAGNOSIS — I2510 Atherosclerotic heart disease of native coronary artery without angina pectoris: Secondary | ICD-10-CM | POA: Insufficient documentation

## 2024-09-28 DIAGNOSIS — K219 Gastro-esophageal reflux disease without esophagitis: Secondary | ICD-10-CM | POA: Diagnosis not present

## 2024-09-28 DIAGNOSIS — N179 Acute kidney failure, unspecified: Secondary | ICD-10-CM | POA: Diagnosis not present

## 2024-09-28 DIAGNOSIS — N1832 Chronic kidney disease, stage 3b: Secondary | ICD-10-CM | POA: Diagnosis not present

## 2024-09-28 DIAGNOSIS — Z79899 Other long term (current) drug therapy: Secondary | ICD-10-CM | POA: Diagnosis not present

## 2024-09-28 DIAGNOSIS — E039 Hypothyroidism, unspecified: Secondary | ICD-10-CM | POA: Diagnosis not present

## 2024-09-28 DIAGNOSIS — E785 Hyperlipidemia, unspecified: Secondary | ICD-10-CM | POA: Diagnosis not present

## 2024-09-28 DIAGNOSIS — Z7982 Long term (current) use of aspirin: Secondary | ICD-10-CM | POA: Diagnosis not present

## 2024-09-28 LAB — PROTIME-INR
INR: 0.9 (ref 0.8–1.2)
Prothrombin Time: 13.1 s (ref 11.4–15.2)

## 2024-09-28 LAB — CBC
HCT: 40.1 % (ref 39.0–52.0)
Hemoglobin: 13.2 g/dL (ref 13.0–17.0)
MCH: 29.3 pg (ref 26.0–34.0)
MCHC: 32.9 g/dL (ref 30.0–36.0)
MCV: 88.9 fL (ref 80.0–100.0)
Platelets: 170 K/uL (ref 150–400)
RBC: 4.51 MIL/uL (ref 4.22–5.81)
RDW: 14.4 % (ref 11.5–15.5)
WBC: 8.1 K/uL (ref 4.0–10.5)
nRBC: 0 % (ref 0.0–0.2)

## 2024-09-28 NOTE — ED Triage Notes (Signed)
 Patient reports central chest pain for several days with SOB and palpitations  , no emesis or diaphoresis .

## 2024-09-29 ENCOUNTER — Encounter (HOSPITAL_COMMUNITY): Payer: Self-pay | Admitting: Family Medicine

## 2024-09-29 ENCOUNTER — Observation Stay (HOSPITAL_COMMUNITY)

## 2024-09-29 DIAGNOSIS — I2510 Atherosclerotic heart disease of native coronary artery without angina pectoris: Secondary | ICD-10-CM | POA: Diagnosis not present

## 2024-09-29 DIAGNOSIS — R9431 Abnormal electrocardiogram [ECG] [EKG]: Secondary | ICD-10-CM

## 2024-09-29 DIAGNOSIS — R079 Chest pain, unspecified: Secondary | ICD-10-CM | POA: Diagnosis not present

## 2024-09-29 DIAGNOSIS — N179 Acute kidney failure, unspecified: Secondary | ICD-10-CM

## 2024-09-29 DIAGNOSIS — E785 Hyperlipidemia, unspecified: Secondary | ICD-10-CM

## 2024-09-29 DIAGNOSIS — I340 Nonrheumatic mitral (valve) insufficiency: Secondary | ICD-10-CM

## 2024-09-29 DIAGNOSIS — I10 Essential (primary) hypertension: Secondary | ICD-10-CM

## 2024-09-29 LAB — TROPONIN I (HIGH SENSITIVITY)
Troponin I (High Sensitivity): 12 ng/L (ref <18)
Troponin I (High Sensitivity): 15 ng/L (ref <18)

## 2024-09-29 LAB — BASIC METABOLIC PANEL WITH GFR
Anion gap: 10 (ref 5–15)
BUN: 49 mg/dL — ABNORMAL HIGH (ref 8–23)
CO2: 21 mmol/L — ABNORMAL LOW (ref 22–32)
Calcium: 8.9 mg/dL (ref 8.9–10.3)
Chloride: 105 mmol/L (ref 98–111)
Creatinine, Ser: 2.57 mg/dL — ABNORMAL HIGH (ref 0.61–1.24)
GFR, Estimated: 23 mL/min — ABNORMAL LOW (ref >60)
Glucose, Bld: 123 mg/dL — ABNORMAL HIGH (ref 70–99)
Potassium: 4.2 mmol/L (ref 3.5–5.1)
Sodium: 136 mmol/L (ref 135–145)

## 2024-09-29 LAB — ECHOCARDIOGRAM COMPLETE
Area-P 1/2: 2.46 cm2
MV VTI: 1.78 cm2
S' Lateral: 4.4 cm

## 2024-09-29 MED ORDER — ACETAMINOPHEN 650 MG RE SUPP: 650.0000 mg | Freq: Four times a day (QID) | RECTAL | Status: DC | PRN

## 2024-09-29 MED ORDER — ATORVASTATIN CALCIUM 40 MG PO TABS
40.0000 mg | ORAL_TABLET | Freq: Every day | ORAL | Status: DC
  Administered 2024-09-29 – 2024-09-30 (×2): 40 mg via ORAL
  Filled 2024-09-29 (×2): qty 1

## 2024-09-29 MED ORDER — PROCHLORPERAZINE EDISYLATE 10 MG/2ML IJ SOLN: 5.0000 mg | Freq: Four times a day (QID) | INTRAMUSCULAR | Status: DC | PRN

## 2024-09-29 MED ORDER — HEPARIN SODIUM (PORCINE) 5000 UNIT/ML IJ SOLN
5000.0000 [IU] | Freq: Three times a day (TID) | INTRAMUSCULAR | Status: DC
  Administered 2024-09-29 – 2024-09-30 (×4): 5000 [IU] via SUBCUTANEOUS
  Filled 2024-09-29 (×5): qty 1

## 2024-09-29 MED ORDER — ESCITALOPRAM OXALATE 10 MG PO TABS
5.0000 mg | ORAL_TABLET | Freq: Every day | ORAL | Status: DC
  Administered 2024-09-29: 5 mg via ORAL
  Filled 2024-09-29: qty 1

## 2024-09-29 MED ORDER — LEVOTHYROXINE SODIUM 50 MCG PO TABS
50.0000 ug | ORAL_TABLET | Freq: Every day | ORAL | Status: DC
  Administered 2024-09-30: 50 ug via ORAL
  Filled 2024-09-29: qty 1

## 2024-09-29 MED ORDER — ACETAMINOPHEN 325 MG PO TABS: 650.0000 mg | ORAL_TABLET | Freq: Four times a day (QID) | ORAL | Status: DC | PRN

## 2024-09-29 MED ORDER — AMLODIPINE BESYLATE 5 MG PO TABS
5.0000 mg | ORAL_TABLET | Freq: Every day | ORAL | Status: DC
  Administered 2024-09-29 – 2024-09-30 (×2): 5 mg via ORAL
  Filled 2024-09-29 (×2): qty 1

## 2024-09-29 MED ORDER — FAMOTIDINE 20 MG PO TABS
40.0000 mg | ORAL_TABLET | Freq: Every morning | ORAL | Status: DC
  Administered 2024-09-30: 40 mg via ORAL
  Filled 2024-09-29: qty 2

## 2024-09-29 MED ORDER — ISOSORBIDE MONONITRATE ER 60 MG PO TB24
60.0000 mg | ORAL_TABLET | Freq: Every day | ORAL | Status: DC
  Administered 2024-09-29 – 2024-09-30 (×2): 60 mg via ORAL
  Filled 2024-09-29: qty 1
  Filled 2024-09-29: qty 2

## 2024-09-29 MED ORDER — NITROGLYCERIN 0.4 MG SL SUBL: 0.4000 mg | SUBLINGUAL_TABLET | SUBLINGUAL | Status: DC | PRN

## 2024-09-29 MED ORDER — ALBUTEROL SULFATE (2.5 MG/3ML) 0.083% IN NEBU: 2.5000 mg | INHALATION_SOLUTION | Freq: Four times a day (QID) | RESPIRATORY_TRACT | Status: DC | PRN

## 2024-09-29 MED ORDER — POLYETHYLENE GLYCOL 3350 17 G PO PACK: 17.0000 g | PACK | Freq: Every day | ORAL | Status: DC | PRN

## 2024-09-29 MED ORDER — HYDRALAZINE HCL 20 MG/ML IJ SOLN: 10.0000 mg | Freq: Four times a day (QID) | INTRAMUSCULAR | Status: DC | PRN

## 2024-09-29 MED ORDER — CLONIDINE HCL 0.1 MG PO TABS
0.1000 mg | ORAL_TABLET | Freq: Two times a day (BID) | ORAL | Status: DC
  Administered 2024-09-29 – 2024-09-30 (×3): 0.1 mg via ORAL
  Filled 2024-09-29 (×3): qty 1

## 2024-09-29 MED ORDER — OXYCODONE HCL 5 MG PO TABS
2.5000 mg | ORAL_TABLET | ORAL | Status: DC | PRN
  Administered 2024-09-29: 5 mg via ORAL
  Filled 2024-09-29: qty 1

## 2024-09-29 MED ORDER — ASPIRIN 81 MG PO TBEC
81.0000 mg | DELAYED_RELEASE_TABLET | Freq: Every day | ORAL | Status: DC
  Administered 2024-09-29 – 2024-09-30 (×2): 81 mg via ORAL
  Filled 2024-09-29 (×2): qty 1

## 2024-09-29 MED ORDER — LAMOTRIGINE 25 MG PO TABS
25.0000 mg | ORAL_TABLET | Freq: Every day | ORAL | Status: DC
  Administered 2024-09-29: 25 mg via ORAL
  Filled 2024-09-29 (×2): qty 1

## 2024-09-29 MED ORDER — ISOSORBIDE MONONITRATE ER 30 MG PO TB24: 30.0000 mg | ORAL_TABLET | Freq: Every day | ORAL | Status: DC

## 2024-09-29 NOTE — ED Provider Notes (Signed)
  EMERGENCY DEPARTMENT AT [facility] Provider Note   CSN: [id number] Arrival date & time: 09/28/24  2310     Patient presents with: Chest Pain   Ryan Mendoza is a 89 y.o. male.   Patient presents to the emergency department for evaluation of chest pain.  Patient experiencing chest heaviness for the last 3 days.  He reports shortness of breath, decreased exercise tolerance and palpitations.  Intermittently he gets a sharp electric-like pain but the underlying pain has been present continuously.  It does feel somewhat like his cardiac symptoms.  He has been taking nitroglycerin  frequently over the last 3 days with improvement of his symptoms but they quickly come back.       Prior to Admission medications   Medication Sig Start Date End Date Taking? Authorizing Provider  amLODipine  (NORVASC ) 10 MG tablet TAKE 1 TABLET BY MOUTH EVERY DAY Patient taking differently: Take 5 mg by mouth in the morning. 10/17/21   Krasowski, Robert J, MD  amoxicillin  (AMOXIL ) 500 MG tablet Take 4 tablets (2,000 mg) one hour prior to all dental visits. 01/30/21   Sebastian Lamarr SAUNDERS, PA-C  aspirin  EC 81 MG tablet Take 81 mg by mouth daily.    [provider]  atorvastatin  (LIPITOR) 40 MG tablet TAKE 1 TABLET BY MOUTH EVERYDAY AT BEDTIME 10/17/21   Bernie Lamar PARAS, MD  carboxymethylcellulose (REFRESH PLUS) 0.5 % SOLN Place 1 drop into both eyes 3 (three) times daily as needed (Dry eyes).    [provider]  cloNIDine  (CATAPRES ) 0.1 MG tablet Take 1 tablet (0.1 mg total) by mouth 2 (two) times daily. 09/23/23   Alvan Ronal BRAVO, MD  escitalopram (LEXAPRO) 5 MG tablet Take 5 mg by mouth at bedtime. 06/15/21   [provider]  famotidine  (PEPCID ) 40 MG tablet Take 40 mg by mouth in the morning.    [provider]  furosemide  (LASIX ) 40 MG tablet Take 40 mg by mouth 2 (two) times a week. 07/24/21   [provider]  isosorbide  mononitrate (IMDUR ) 30 MG  24 hr tablet TAKE 1 TABLET BY MOUTH EVERY DAY 06/01/24   O'Neal, Darryle Ned, MD  lamoTRIgine (LAMICTAL) 25 MG tablet Take 50 tablets by mouth at bedtime. 03/11/21   [provider]  levothyroxine  (SYNTHROID ) 50 MCG tablet Take 50 mcg by mouth daily before breakfast.  09/02/20   [provider]  nitroGLYCERIN  (NITROSTAT ) 0.4 MG SL tablet PLACE 1 TABLET UNDER THE TONGUE EVERY 5 MINUTES AS NEEDED FOR CHEST PAIN. 09/16/22   O'NealDarryle Ned, MD  pantoprazole  (PROTONIX ) 40 MG tablet TAKE 1 TABLET BY MOUTH EVERY DAY Patient not taking: Reported on 11/26/2023 04/06/23   Barbaraann Darryle Ned, MD    Allergies: Patient has no known allergies.    Review of Systems  Updated Vital Signs BP (!) 169/66 (BP Location: Right Arm)   Pulse 60   Temp (!) 97.5 F (36.4 C)   Resp 17   SpO2 93%   Physical Exam Vitals and nursing note reviewed.  Constitutional:      General: He is not in acute distress.    Appearance: He is well-developed.  HENT:     Head: Normocephalic and atraumatic.     Mouth/Throat:     Mouth: Mucous membranes are moist.  Eyes:     General: Vision grossly intact. Gaze aligned appropriately.     Extraocular Movements: Extraocular movements intact.     Conjunctiva/sclera: Conjunctivae normal.  Cardiovascular:  Rate and Rhythm: Normal rate and regular rhythm.     Pulses: Normal pulses.     Heart sounds: Normal heart sounds, S1 normal and S2 normal. No murmur heard.    No friction rub. No gallop.  Pulmonary:     Effort: Pulmonary effort is normal. No respiratory distress.     Breath sounds: Normal breath sounds.  Abdominal:     Palpations: Abdomen is soft.     Tenderness: There is no abdominal tenderness. There is no guarding or rebound.     Hernia: No hernia is present.  Musculoskeletal:        General: No swelling.     Cervical back: Full passive range of motion without pain, normal range of motion and neck supple. No pain with movement, spinous  process tenderness or muscular tenderness. Normal range of motion.     Right lower leg: No edema.     Left lower leg: No edema.  Skin:    General: Skin is warm and dry.     Capillary Refill: Capillary refill takes less than 2 seconds.     Findings: No ecchymosis, erythema, lesion or wound.  Neurological:     Mental Status: He is alert and oriented to person, place, and time.     GCS: GCS eye subscore is 4. GCS verbal subscore is 5. GCS motor subscore is 6.     Cranial Nerves: Cranial nerves 2-12 are intact.     Sensory: Sensation is intact.     Motor: Motor function is intact. No weakness or abnormal muscle tone.     Coordination: Coordination is intact.  Psychiatric:        Mood and Affect: Mood normal.        Speech: Speech normal.        Behavior: Behavior normal.     (all labs ordered are listed, but only abnormal results are displayed) Labs Reviewed  BASIC METABOLIC PANEL WITH GFR - Abnormal; Notable for the following components:      Result Value   CO2 21 (*)    Glucose, Bld 123 (*)    BUN 49 (*)    Creatinine, Ser 2.57 (*)    GFR, Estimated 23 (*)    All other components within normal limits  CBC  PROTIME-INR  TROPONIN I (HIGH SENSITIVITY)  TROPONIN I (HIGH SENSITIVITY)    EKG: EKG Interpretation Date/Time:  Thursday September 28 2024 23:20:24 EDT Ventricular Rate:  59 PR Interval:  170 QRS Duration:  104 QT Interval:  418 QTC Calculation: 413 R Axis:   -22  Text Interpretation: Sinus bradycardia Moderate voltage criteria for LVH, may be normal variant ( R in aVL , Cornell product ) Septal infarct , age undetermined Abnormal ECG When compared with ECG of 24-Jan-2021 06:36, No significant change since last tracing Confirmed by Haze Lonni PARAS ([id number]) on 09/29/2024 12:31:56 AM  Radiology: [facility] Chest 2 View Result Date: 09/28/2024 EXAM: 2 VIEW(S) XRAY OF THE CHEST 09/28/2024 11:29:46 PM COMPARISON: Chest x-ray 12:32:3  and CT chest 142 021. CLINICAL HISTORY:  Chest pain chest pain. FINDINGS: LUNGS AND PLEURA: There is a small nodular density in the right upper lobe which is not definitely seen on prior. No pulmonary edema. No pleural effusion. No pneumothorax. HEART AND MEDIASTINUM: No acute abnormality of the cardiac and mediastinal silhouettes. BONES AND SOFT TISSUES: No acute osseous abnormality. IMPRESSION: 1. No acute findings. 2. Small nodular density in the right upper lobe, indeterminate. Recommend follow-up non-emergent chest CT.  Electronically signed by: Greig Pique MD 09/28/2024 11:48 PM EDT RP Workstation: [id number]     Procedures   Medications Ordered in the ED - No data to display                                  Medical Decision Making Amount and/or Complexity of Data Reviewed External Data Reviewed: notes.    Details: Left heart catheterization in 2024 shows 50% in-stent stenosis of prior circumflex artery stent and 60% ostial lesion, nonobstructive disease elsewhere Labs: ordered. Radiology: ordered.   Differential Diagnosis considered includes, but not limited to: STEMI; NSTEMI; myocarditis; pericarditis; pulmonary embolism; aortic dissection; pneumothorax; pneumonia; gastritis; musculoskeletal pain  Presents to the emergency department for evaluation of chest pain.  Patient has a mixed picture of typical and atypical features.  He reports a heaviness over his chest that improves with nitroglycerin  but comes back.  It is not, however, related to exertion.  Patient reports that he does not typically use nitroglycerin , the last 3 days have been unusual for him.  He does also have some atypical sharp, electric like shock pains that occur sporadically as well.  Reviewing his record reveals previous stenting of the circumflex artery with in-stent stenosis but no high-grade stenosis noted 1 year ago.  Patient's creatinine is 2.57, consistent with acute kidney injury.  It looks at her baseline is 1.6.  Patient reportedly does take  Lasix  twice a week, BUN is elevated and a prerenal pattern.  No signs of volume overload at this time.  Patient will require hospitalization for further management.       Final diagnoses:  Chest pain, unspecified type    ED Discharge Orders     None          Cayla Wiegand, Lonni PARAS, MD 09/29/24 0100

## 2024-09-29 NOTE — Progress Notes (Signed)
  Echocardiogram 2D Echocardiogram has been performed.  Koleen KANDICE Popper, RDCS 09/29/2024, 2:12 PM

## 2024-09-29 NOTE — ED Notes (Signed)
 6E charge RN made aware of pt incoming transfer

## 2024-09-29 NOTE — H&P (Signed)
 [facility] History and Physical  Ryan Mendoza FMW:[id number] DOB: Apr 26, 1934 DOA: 09/28/2024 PCP: Magdaline Debby HERO, MD  Presented from: Home Chief Complaint: Chest pain, shortness of breath  History of Present Illness: Ryan Mendoza is a 89 y.o. male with PMH significant for HTN, HLD, OSA, CAD s/p stent, CKD, MR s/p MitraClip 2022, AVR, BPH, GERD, hypothyroidism,, primary hyperparathyroidism. Patient presented to ED last night with complaint of central chest pain for several days with shortness of breath, palpitation.  Chest pain is intermittent shooting kind of pain in the precordial area and is not related to exertion but shortness of breath worse with exertion..  He has been taking nitroglycerin  frequently over the last 3 days with improvement in symptoms but only to quickly recur.  In the ED, patient was afebrile, heart rate 40s and 50s, blood pressure 169/66, breathing on room air Labs with troponin negative x 2, CBC unremarkable, BMP with BUN/creatinine 49/2.57 Chest x-ray with no acute finding and a small RUL nodule EKG with sinus bradycardia, QTc 413 ms, LVH, nonspecific ST-T wave changes  Hospitalist service was consulted for further management I received this patient as a carryover admission this morning.  At the time of my evaluation, patient was lying on bed.  Not in distress.  His daughter Dorthea was at bedside. History reviewed in detail as above.   Review of Systems:  All systems were reviewed and were negative unless otherwise mentioned in the HPI   Past medical history: Past Medical History:  Diagnosis Date   Anxiety    Aortic valve regurgitation    Arthritis    hands and knees   BPH (benign prostatic hyperplasia)    Bradycardia 11/22/2019   Cataract, left eye    Claustrophobia    Coronary artery disease    Coronary artery disease involving native coronary artery of native heart with angina pectoris 12/08/2018   PTCA and stenting of LAD and circumflex  in 2017  Formatting of this note might be different from the original. PTCA and drug-eluting stent to LAD as well as the circumflex in June 2017   Depression    no current meds for   Elevated cholesterol    Essential hypertension 06/19/2019   Gallstones    GERD (gastroesophageal reflux disease)    History of kidney stones    Hyperparathyroidism, primary 09/27/2017   Hypertension    Hypothyroidism    Mitral regurgitation 12/08/2018   Mild to moderate by echo from August 2019   Myocardial infarction (HCC)    SILENT   Obstructive sleep apnea 12/08/2018   Primary hyperparathyroidism    S/P mitral valve clip implantation 01/23/2021   s/p TEER using a single G4 NT W MitraClip device, placed on the lateral side of A2/P2 with Dr. Wonda Mingle    below right elbow healing well patient stated hurt working on lawn mower   Status post coronary artery stent placement 12/21/2017   Formatting of this note might be different from the original. LAD and Cx 2017   Tinnitus    both ears all the time    Past surgical history: Past Surgical History:  Procedure Laterality Date   BUBBLE STUDY  10/11/2020   Procedure: BUBBLE STUDY;  Surgeon: Okey Vina GAILS, MD;  Location: [facility] ENDOSCOPY;  Service: Cardiovascular;;   CATARACT EXTRACTION W/PHACO Left 04/12/2018   Procedure: CATARACT EXTRACTION PHACO AND INTRAOCULAR LENS PLACEMENT (IOC);  Surgeon: Jaye Fallow, MD;  Location: [facility] ORS;  Service: Ophthalmology;  Laterality:  Left;  US  00:39.5 AP% 16.7 CDE 6.62 Fluid Pack Lot # [id number]   CORONARY ANGIOPLASTY     2017   EYE SURGERY Right    ioc for cataract   LEFT HEART CATH AND CORONARY ANGIOGRAPHY N/A 06/01/2023   Procedure: LEFT HEART CATH AND CORONARY ANGIOGRAPHY;  Surgeon: Anner Alm ORN, MD;  Location: [facility] INVASIVE CV LAB;  Service: Cardiovascular;  Laterality: N/A;   PARATHYROIDECTOMY Right 09/28/2017   Procedure: RIGHT INFERIOR PARATHYROIDECTOMY;  Surgeon: Eletha Boas, MD;  Location: [facility] ORS;   Service: General;  Laterality: Right;   right eye  plug for tear duct surgery     RIGHT/LEFT HEART CATH AND CORONARY ANGIOGRAPHY N/A 01/10/2021   Procedure: RIGHT/LEFT HEART CATH AND CORONARY ANGIOGRAPHY;  Surgeon: Wonda Sharper, MD;  Location: [facility] INVASIVE CV LAB;  Service: Cardiovascular;  Laterality: N/A;   stents to heart x 2  06/2016   TEE WITHOUT CARDIOVERSION N/A 10/11/2020   Procedure: TRANSESOPHAGEAL ECHOCARDIOGRAM (TEE);  Surgeon: Okey Vina GAILS, MD;  Location: [facility] ENDOSCOPY;  Service: Cardiovascular;  Laterality: N/A;   TEE WITHOUT CARDIOVERSION N/A 01/23/2021   Procedure: TRANSESOPHAGEAL ECHOCARDIOGRAM (TEE);  Surgeon: Wonda Sharper, MD;  Location: [facility] INVASIVE CV LAB;  Service: Cardiovascular;  Laterality: N/A;   TONSILLECTOMY     age 30   TRANSCATHETER MITRAL EDGE TO EDGE REPAIR N/A 01/23/2021   Procedure: MITRAL VALVE REPAIR;  Surgeon: Wonda Sharper, MD;  Location: [facility] INVASIVE CV LAB;  Service: Cardiovascular;  Laterality: N/A;    Social History:  reports that he has quit smoking. His smoking use included cigarettes. He has a 10 pack-year smoking history. He has never used smokeless tobacco. He reports that he does not drink alcohol  and does not use drugs.  Allergies:  No Known Allergies Patient has no known allergies.   Family history:  Family History  Problem Relation Age of Onset   Heart disease Mother    Heart attack Mother    Heart disease Father    Heart attack Father      Physical Exam: Vitals:   09/29/24 0641 09/29/24 0700 09/29/24 1000 09/29/24 1300  BP:  (!) 149/52 (!) 140/80 (!) 154/114  Pulse:  (!) 47 (!) 49 64  Resp:  12 13 17   Temp: 97.6 F (36.4 C)  97.7 F (36.5 C) 98 F (36.7 C)  TempSrc: Oral  Oral Oral  SpO2:  100% 95% 98%   Wt Readings from Last 3 Encounters:  11/26/23 83.1 kg  06/01/23 77.1 kg  05/27/23 81.8 kg   There is no height or weight on file to calculate BMI.  General exam: Pleasant, elderly Caucasian male. Skin: No  rashes, lesions or ulcers. HEENT: Atraumatic, normocephalic, no obvious bleeding Lungs: Clear to auscultation bilaterally,  CVS: S1, S2, systolic murmur,   GI/Abd: Soft, nontender, nondistended, bowel sound present,   CNS: Alert, awake, oriented x 3 Psychiatry: Mood appropriate Extremities: No pedal edema, no calf tenderness,    ----------------------------------------------------------------------------------------------------------------------------------------- ----------------------------------------------------------------------------------------------------------------------------------------- -----------------------------------------------------------------------------------------------------------------------------------------  Assessment/Plan: Chest pain at rest Progressive for the last 3 days Associate shortness of breath, palpitation Troponin negative.   EKG without acute ischemic findings Obtain echo to rule out WMA Recent Labs    09/28/24 2324 09/29/24 0132  TROPONINIHS 12 15   H/o MR s/p MitraClip 2022,  AVR Most recent echo from July 2024 with EF 50 to 55%, no WMA, mild concentric LVH, moderate to severe MR, mild to moderate AI, moderate pulm regurgitation. Repeat echo to rule out any worsening of valvular  regurgitation as the cause of her symptoms. Cardiology consulted  AKI on CKD 3b Per [facility] records, baseline creatinine from August 2025 was 2.08.   Presented with creatinine elevated 2.57. Follows up with nephrologist Dr. Jerrye at [facility] Recent Labs    09/28/24 2324  BUN 49*  CREATININE 2.57*  CO2 21*   CAD s/p stent HLD PTA meds- aspirin  81 mg daily, Lipitor 40 mg daily, Imdur  30 mg daily Continue all  Hypertension PTA meds- amlodipine  5 mg daily, clonidine  0.1 mg twice daily, Imdur  30 mg daily, losartan 25 mg daily, Lasix  40 mg 3 times a week Given AKI, I would avoid Lasix  and losartan for now.  Resume amlodipine , clonidine ,  Imdur .  GERD Pepcid   Hypothyroidism Continue Synthroid   Primary hyperparathyroidism   Depression PTA meds- Lexapro, Lamictal 50 mg nightly  Mobility: Independent at baseline PT Orders:   PT Follow up Rec:    Goals of care:   Code Status: Full Code    DVT prophylaxis:  heparin  injection 5,000 Units Start: 09/29/24 0830   Antimicrobials: None Fluid: None Consultants: Cardiology Family Communication: Daughter at bedside  Status: Observation Level of care:  Telemetry Cardiac   Patient is from: Home Anticipated d/c to: Pending clinical course  Diet:  Diet Order             Diet Heart Room service appropriate? Yes; Fluid consistency: Thin  Diet effective now                    ------------------------------------------------------------------------------------- Severity of Illness: The appropriate patient status for this patient is OBSERVATION. Observation status is judged to be reasonable and necessary in order to provide the required intensity of service to ensure the patient's safety. The patient's presenting symptoms, physical exam findings, and initial radiographic and laboratory data in the context of their medical condition is felt to place them at decreased risk for further clinical deterioration. Furthermore, it is anticipated that the patient will be medically stable for discharge from the hospital within 2 midnights of admission.     Home Meds: Prior to Admission medications   Medication Sig Start Date End Date Taking? Authorizing Provider  acetaminophen  (TYLENOL ) 500 MG tablet Take 1,000 mg by mouth every 6 (six) hours as needed for mild pain (pain score 1-3) or moderate pain (pain score 4-6).   Yes [provider]  amLODipine  (NORVASC ) 5 MG tablet Take 5 mg by mouth daily. 08/09/24  Yes [provider]  amoxicillin  (AMOXIL ) 500 MG tablet Take 4 tablets (2,000 mg) one hour prior to all dental visits. 01/30/21  Yes Sebastian Lamarr SAUNDERS,  PA-C  aspirin  EC 81 MG tablet Take 81 mg by mouth daily.   Yes [provider]  atorvastatin  (LIPITOR) 40 MG tablet TAKE 1 TABLET BY MOUTH EVERYDAY AT BEDTIME 10/17/21  Yes Krasowski, Robert J, MD  carboxymethylcellulose (REFRESH PLUS) 0.5 % SOLN Place 1 drop into both eyes 3 (three) times daily as needed (Dry eyes).   Yes [provider]  Cholecalciferol 25 MCG (1000 UT) capsule Take 1,000 Units by mouth in the morning.   Yes [provider]  cloNIDine  (CATAPRES ) 0.1 MG tablet Take 1 tablet (0.1 mg total) by mouth 2 (two) times daily. 09/23/23  Yes BranchRonal BRAVO, MD  escitalopram (LEXAPRO) 5 MG tablet Take 5 mg by mouth at bedtime. 06/15/21  Yes [provider]  famotidine  (PEPCID ) 40 MG tablet Take 40 mg by mouth in the morning.   Yes [provider]  furosemide  (LASIX ) 40 MG tablet Take 40 mg by mouth 3 (three) times a week. Take one tablet by mouth three times per week on Tuesday, Wednesday, and Friday. 07/24/21  Yes [provider]  isosorbide  mononitrate (IMDUR ) 30 MG 24 hr tablet TAKE 1 TABLET BY MOUTH EVERY DAY 06/01/24  Yes O'Neal, Darryle Ned, MD  lamoTRIgine (LAMICTAL) 25 MG tablet Take 50 tablets by mouth at bedtime. 03/11/21  Yes [provider]  levothyroxine  (SYNTHROID ) 50 MCG tablet Take 50 mcg by mouth daily before breakfast.  09/02/20  Yes [provider]  losartan (COZAAR) 25 MG tablet Take 25 mg by mouth daily. 07/26/24  Yes [provider]  nitroGLYCERIN  (NITROSTAT ) 0.4 MG SL tablet PLACE 1 TABLET UNDER THE TONGUE EVERY 5 MINUTES AS NEEDED FOR CHEST PAIN. 09/16/22  Yes O'Neal, Darryle Ned, MD    Labs on Admission:   CBC: Recent Labs  Lab 09/28/24 2324  WBC 8.1  HGB 13.2  HCT 40.1  MCV 88.9  PLT 170    Basic Metabolic Panel: Recent Labs  Lab 09/28/24 2324  NA 136  K 4.2  CL 105  CO2 21*  GLUCOSE 123*  BUN 49*  CREATININE 2.57*  CALCIUM  8.9    Liver Function Tests: No results  for input(s): AST, ALT, ALKPHOS, BILITOT, PROT, ALBUMIN in the last 168 hours. No results for input(s): LIPASE, AMYLASE in the last 168 hours. No results for input(s): AMMONIA in the last 168 hours.  Cardiac Enzymes: No results for input(s): CKTOTAL, CKMB, CKMBINDEX, TROPONINI in the last 168 hours.  BNP (last 3 results) No results for input(s): BNP in the last 8760 hours.  ProBNP (last 3 results) No results for input(s): PROBNP in the last 8760 hours.  CBG: No results for input(s): GLUCAP in the last 168 hours.  Lipase  No results found for: LIPASE   Urinalysis    Component Value Date/Time   COLORURINE YELLOW 01/21/2021 1114   APPEARANCEUR CLEAR 01/21/2021 1114   LABSPEC 1.025 01/21/2021 1114   PHURINE 5.0 01/21/2021 1114   GLUCOSEU NEGATIVE 01/21/2021 1114   HGBUR NEGATIVE 01/21/2021 1114   BILIRUBINUR NEGATIVE 01/21/2021 1114   KETONESUR NEGATIVE 01/21/2021 1114   PROTEINUR NEGATIVE 01/21/2021 1114   NITRITE NEGATIVE 01/21/2021 1114   LEUKOCYTESUR NEGATIVE 01/21/2021 1114     Drugs of Abuse  No results found for: LABOPIA, COCAINSCRNUR, LABBENZ, AMPHETMU, THCU, LABBARB    Radiological Exams on Admission: DG Chest 2 View Result Date: 09/28/2024 EXAM: 2 VIEW(S) XRAY OF THE CHEST 09/28/2024 11:29:46 PM COMPARISON: Chest x-ray 12:32:3  and CT chest 142 021. CLINICAL HISTORY: Chest pain chest pain. FINDINGS: LUNGS AND PLEURA: There is a small nodular density in the right upper lobe which is not definitely seen on prior. No pulmonary edema. No pleural effusion. No pneumothorax. HEART AND MEDIASTINUM: No acute abnormality of the cardiac and mediastinal silhouettes. BONES AND SOFT TISSUES: No acute osseous abnormality. IMPRESSION: 1. No acute findings. 2. Small nodular density in the right upper lobe, indeterminate. Recommend follow-up non-emergent chest CT. Electronically signed by: Greig Pique MD 09/28/2024 11:48 PM EDT RP  Workstation: [id number]     Signed, Chapman Rota, MD [facility] 09/29/2024

## 2024-09-29 NOTE — Consult Note (Addendum)
 Cardiology Consultation   Patient ID: Ryan Mendoza MRN: [id number]; DOB: 09/29/1934  Admit date: 09/28/2024 Date of Consult: 09/29/2024  PCP:  Magdaline Debby HERO, MD   [facility] Providers Cardiologist:  Darryle ONEIDA Decent, MD       Patient Profile: Ryan Mendoza is a 89 y.o. male with a hx of CAD, mitral valve regurgitation s/p mitral clip, AI, CKD stage III, HTN who is being seen 09/29/2024 for the evaluation of chest pain at the request of Dr Arlice.  History of Present Illness: Ryan Mendoza is an 89 year old male with above medical history. Previously followed by Dr Bernie but has been more recently followed by Dr Burton.   Patient previously had stenting of the LAD and left circumflex in 2017. In fall of 2021, he had worsening dyspnea. Underwent TEE 10/11/20 EF 50-55%, low normal RV systolic function. There was severe mitral regurgitation, moderate aortic valve regurgitation. Patient was referred to Dr. Wonda for further eval. Underwent R/L heart cath 01/10/21 that showed patent LAD stent with mild nonobstructive plaquing in the LAD, patent left circumflex stent with 50% diffuse in-stent restenosis and 50% ostial stenosis. There was diffuse nonobstructive RCA stenosis. Normal RH pressures. Underwent MitraClip on 01/23/21.   Echocardiogram in 02/2021 showed EF 55-60%, no regional wall motion abnormalities, grade I DD, normal RV systolic function, normal PA systolic pressure, mild-moderate MR and no MS. AI was mild-moderate.   In 01/2022, patient wore a cardiac monitor for evaluation of dizziness that showed brief ectopic atrial tachycardia with no significant bradycardia, rare ectopy.   In 05/2023, patient was seen for evaluation of persistent chest pressure.  Improved with nitroglycerin .  Patient was also very fatigued.  He underwent cardiac catheterization 06/01/2023 that showed stable CAD with no change from 2022.  Left main was patent with no significant stenosis, LAD stent was  patent with mild nonobstructive plaquing in the LAD.  Left circumflex stents had 50% diffuse in-stent restenosis and 50-60% ostial circumflex stenosis.  Diffuse nonobstructive RCA stenosis present.  Normal LVEDP.  Started on Imdur .  Echocardiogram in 06/2023 showed EF 50-55%, no regional wall motion abnormalities, normal RV systolic function.  There was moderate-severe mitral valve regurgitation.  Aortic regurgitation mild-moderate.  Her last by cardiology 11/26/2023.  At that time, patient denied chest pain.  He had started to exercise more but felt like he was getting short of breath when active.  Reported he had been followed by nephrology for CKD.  Remain on aspirin , Lipitor, Imdur , Lasix  40 mg twice per week.  Patient presented to the ED on 10/16 complaining of chest pain that had been ongoing for several days.  Also had shortness of breath and palpitations.  He had been taking nitroglycerin  frequently over the past 3 days with improvement in his symptoms, but they returned quickly. hsTn negative x2. Creatinine elevated to 2.57. CBC within normal limits. CXR showed no acute findings, small nodular density in the right upper lobe.   On interview, patient reports that he has been feeling poorly and has had low energy for a few weeks. Last week, he was at the beach when he started to have episodes of chest pain. Pain feels like a shock, followed by some pressure-like sensations. These episodes have been occurring randomly. Not associated with exertion. Not positional or pleuritic. No obvious triggers. Yesterday evening, he had been sitting in his recliner when he had an episode of this pain. Pain resolved, but then returned about 15 minutes later. He  took a nitroglycerin  and symptoms improved but again returned. He decided to come to the ED for further evaluation. He has occasional shortness of breath on exertion. No orthopnea. No recent URI symptoms. He was nauseous last week. No diarrhea or vomiting    Past Medical History:  Diagnosis Date   Anxiety    Aortic valve regurgitation    Arthritis    hands and knees   BPH (benign prostatic hyperplasia)    Bradycardia 11/22/2019   Cataract, left eye    Claustrophobia    Coronary artery disease    Coronary artery disease involving native coronary artery of native heart with angina pectoris 12/08/2018   PTCA and stenting of LAD and circumflex in 2017  Formatting of this note might be different from the original. PTCA and drug-eluting stent to LAD as well as the circumflex in June 2017   Depression    no current meds for   Elevated cholesterol    Essential hypertension 06/19/2019   Gallstones    GERD (gastroesophageal reflux disease)    History of kidney stones    Hyperparathyroidism, primary 09/27/2017   Hypertension    Hypothyroidism    Mitral regurgitation 12/08/2018   Mild to moderate by echo from August 2019   Myocardial infarction (HCC)    SILENT   Obstructive sleep apnea 12/08/2018   Primary hyperparathyroidism    S/P mitral valve clip implantation 01/23/2021   s/p TEER using a single G4 NT W MitraClip device, placed on the lateral side of A2/P2 with Dr. Wonda Mingle    below right elbow healing well patient stated hurt working on lawn mower   Status post coronary artery stent placement 12/21/2017   Formatting of this note might be different from the original. LAD and Cx 2017   Tinnitus    both ears all the time    Past Surgical History:  Procedure Laterality Date   BUBBLE STUDY  10/11/2020   Procedure: BUBBLE STUDY;  Surgeon: Okey Vina GAILS, MD;  Location: [facility] ENDOSCOPY;  Service: Cardiovascular;;   CATARACT EXTRACTION W/PHACO Left 04/12/2018   Procedure: CATARACT EXTRACTION PHACO AND INTRAOCULAR LENS PLACEMENT (IOC);  Surgeon: Jaye Fallow, MD;  Location: [facility] ORS;  Service: Ophthalmology;  Laterality: Left;  US  00:39.5 AP% 16.7 CDE 6.62 Fluid Pack Lot # [id number]   CORONARY ANGIOPLASTY     2017   EYE SURGERY Right     ioc for cataract   LEFT HEART CATH AND CORONARY ANGIOGRAPHY N/A 06/01/2023   Procedure: LEFT HEART CATH AND CORONARY ANGIOGRAPHY;  Surgeon: Anner Alm ORN, MD;  Location: [facility] INVASIVE CV LAB;  Service: Cardiovascular;  Laterality: N/A;   PARATHYROIDECTOMY Right 09/28/2017   Procedure: RIGHT INFERIOR PARATHYROIDECTOMY;  Surgeon: Eletha Boas, MD;  Location: [facility] ORS;  Service: General;  Laterality: Right;   right eye  plug for tear duct surgery     RIGHT/LEFT HEART CATH AND CORONARY ANGIOGRAPHY N/A 01/10/2021   Procedure: RIGHT/LEFT HEART CATH AND CORONARY ANGIOGRAPHY;  Surgeon: Wonda Sharper, MD;  Location: [facility] INVASIVE CV LAB;  Service: Cardiovascular;  Laterality: N/A;   stents to heart x 2  06/2016   TEE WITHOUT CARDIOVERSION N/A 10/11/2020   Procedure: TRANSESOPHAGEAL ECHOCARDIOGRAM (TEE);  Surgeon: Okey Vina GAILS, MD;  Location: [facility] ENDOSCOPY;  Service: Cardiovascular;  Laterality: N/A;   TEE WITHOUT CARDIOVERSION N/A 01/23/2021   Procedure: TRANSESOPHAGEAL ECHOCARDIOGRAM (TEE);  Surgeon: Wonda Sharper, MD;  Location: [facility] INVASIVE CV LAB;  Service: Cardiovascular;  Laterality: N/A;  TONSILLECTOMY     age 14   TRANSCATHETER MITRAL EDGE TO EDGE REPAIR N/A 01/23/2021   Procedure: MITRAL VALVE REPAIR;  Surgeon: Wonda Sharper, MD;  Location: [facility] INVASIVE CV LAB;  Service: Cardiovascular;  Laterality: N/A;     Scheduled Meds:  heparin   5,000 Units Subcutaneous Q8H   Continuous Infusions:  PRN Meds: acetaminophen  **OR** acetaminophen , acetaminophen , albuterol, bisacodyl, hydrALAZINE , nitroGLYCERIN , oxyCODONE, polyethylene glycol, prochlorperazine  Allergies:   No Known Allergies  Social History:   Social History   Socioeconomic History   Marital status: Married    Spouse name: Not on file   Number of children: 3   Years of education: Not on file   Highest education level: Not on file  Occupational History   Not on file  Tobacco Use   Smoking status: Former    Current  packs/day: 1.00    Average packs/day: 1 pack/day for 10.0 years (10.0 ttl pk-yrs)    Types: Cigarettes   Smokeless tobacco: Never   Tobacco comments:    quit 1974  Vaping Use   Vaping status: Never Used  Substance and Sexual Activity   Alcohol  use: No   Drug use: No   Sexual activity: Not on file  Other Topics Concern   Not on file  Social History Narrative   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 : Never true  Transportation Needs: Not on file (07/21/2023)  Physical Activity: Not on file  Stress: Not on file  Social Connections: Not on file  Intimate Partner Violence: Not on file    Family History:   Family History  Problem Relation Age of Onset   Heart disease Mother    Heart attack Mother    Heart disease Father    Heart attack Father      ROS:  Please see the history of present illness.   All other ROS reviewed and negative.     Physical Exam/Data: Vitals:   09/29/24 0443 09/29/24 0641 09/29/24 0700 09/29/24 1000  BP: 129/62  (!) 149/52 (!) 140/80  Pulse: (!) 52  (!) 47 (!) 49  Resp: 13  12 13   Temp:  97.6 F (36.4 C)  97.7 F (36.5 C)  TempSrc:  Oral  Oral  SpO2: 100%  100% 95%   No intake or output data in the 24 hours ending 09/29/24 1232    11/26/2023    3:24 PM 06/01/2023    5:44 AM 05/27/2023    4:25 PM  Last 3 Weights  Weight (lbs) 183 lb 3.2 oz 170 lb 180 lb 6.4 oz  Weight (kg) 83.099 kg 77.111 kg 81.829 kg     There is no height or weight on file to calculate BMI.  General:  Well nourished, well developed, in no acute distress. Laying in the bed with head elevated   HEENT: normal Neck: no JVD Cardiac:  normal S1, S2; RRR; no murmur Lungs:  clear to auscultation bilaterally, no wheezing, rhonchi or rales. Normal WOB on room air  Abd: soft, nontender  Ext: no edema in BLE Musculoskeletal:  No deformities  Skin: warm and dry  Neuro:  CNs 2-12 intact  Psych:  Normal affect   EKG:  The EKG was personally reviewed and demonstrates:  Normal sinus rhythm, LVH Telemetry:  Telemetry was personally reviewed and demonstrates:  NSR   Relevant CV Studies: Cardiac Studies & Procedures   ______________________________________________________________________________________________ CARDIAC CATHETERIZATION  CARDIAC CATHETERIZATION 06/01/2023  Conclusion   Ost Cx to Prox Cx lesion is 55% stenosed.  Mid Cx to Dist Cx STENT is 45% stenosed.   Mid LAD STENT is 30% stenosed.   Prox RCA lesion is 55% stenosed.   LV end diastolic pressure is normal.   There is no aortic valve stenosis.   -------------------------   Recommend Aspirin  81mg  daily for moderate CAD.  SUMMARY Stable CAD - no change from 2022 1. Patent left main with no significant stenosis (~20% dLM) 2. Patent LAD stent with mild nonobstructive plaquing in the LAD - tapers to small caliber vessel distally 3. Patent left circumflex stent with 50% diffuse in-stent restenosis and 50-60% ostial circumflex stenosis, no high-grade disease throughout the circumflex distribution (no notable change from prior cath) 4. Diffuse nonobstructive RCA stenosis (large dominant vessel) 5. Normal LVEDP with systemic HTN  RECOMMENDATIONS D/C Home after bedrest -- consider non-angina cause for CP; however, will add Imdur  30 mg empirically.   Alm Clay, MD  Findings Coronary Findings Diagnostic  Dominance: Right  Left Main The vessel exhibits minimal luminal irregularities. Patent with no significant stenosis.  Divides into the LAD, intermediate branch, and left circumflex.  Left Anterior Descending Mid LAD  lesion is 30% stenosed. The lesion was previously treated using a stent (unknown type) over 2 years ago.  Left Circumflex Ost Cx to Prox Cx lesion is 55% stenosed. Mid Cx to Dist Cx lesion is 45% stenosed. The lesion was previously treated using a stent (unknown type) over 2 years ago.  Right Coronary Artery There is mild diffuse disease throughout the vessel. The right coronary artery is a large, dominant vessel.  The vessel has diffuse plaquing throughout.  There are no high-grade stenoses identified.  The PDA is large caliber.  The first PLA is medium caliber.  The PDA branch has diffuse plaquing but no more than 30% stenosis. Prox RCA lesion is 55% stenosed.  Intervention  No interventions have been documented.   CARDIAC CATHETERIZATION  CARDIAC CATHETERIZATION 01/23/2021  Conclusion Successful transcatheter edge-to-edge mitral valve repair using a single G4 NT W MitraClip device, placed on the lateral side of A2/P2.  Mitral regurgitation reduced from 4+ preprocedure to 1+ post procedure with a residual eccentric jet along the medial aspect of P2.  Recommend: Aspirin  and clopidogrel  x3 months Limited echo tomorrow morning   STRESS TESTS  ECHOCARDIOGRAM STRESS TEST 12/19/2018  Narrative *[facility]* [address] [place], [place] [address] [phone]  ------------------------------------------------------------------- Stress Echocardiography  Patient:    Ryan Mendoza, Ryan Mendoza MR #:       [id number] Study Date: 12/19/2018 Gender:     M Age:        79 Height:     172.7 cm Weight:     88.2 kg BSA:        2.08 m^2 Pt. Status: Room:  ATTENDING    Lamar Fitch, MD ORDERING     Lamar Fitch, MD REFERRING    Dottie Heath, Norleen MANO SONOGRAPHER  Jimmy Reel, RDCS PERFORMING   [facility], [place]  cc:  -------------------------------------------------------------------  -------------------------------------------------------------------  Indications:       CAD of native vessels 414.01.  Dyspnea 786.09.  ------------------------------------------------------------------- History:   PMH:   Mitral regurgitation.  ------------------------------------------------------------------- Study Conclusions  - Stress ECG conclusions: There were no stress arrhythmias or conduction abnormalities. The stress ECG was negative for ischemia. - Staged echo: There was no echocardiographic evidence for stress-induced ischemia.  Impressions:  - Fair exercise tolerance No chest pain during the exercise. No evidence of ischemia base on ECG and echo criteria. Overall it is NEGATIVE stress test for exercise induced ischemia.  ------------------------------------------------------------------- Study data:   Study status:  Routine.  Consent:  The risks, benefits, and alternatives to the procedure were explained to the patient and informed consent was obtained.  Procedure:  The patient reported no pain pre or post test. Initial setup. The patient was brought to the laboratory. A baseline ECG was recorded. Surface ECG leads and automatic cuff blood pressure measurements were monitored. Treadmill exercise testing was performed using the Bruce protocol. The patient exercised for 7 min 0.02 sec, to protocol stage 3, to a maximal work rate of 10.1 mets. The patient was positioned for image acquisition and recovery monitoring. Transthoracic stress echocardiography for chest pain evaluation. Image quality was adequate. Images were captured at baseline and peak exercise.  Study completion:  The patient tolerated the procedure well. There were no complications.          Bruce protocol. Stress echocardiography.  Birthdate:  Patient birthdate: 1934-04-24.  Age:  Patient is 89 yr old.  Sex:  Gender: male. BMI: 29.6 kg/m^2.  Blood pressure:     140/82  Patient status: Outpatient.  Study date:  Study date: 12/19/2018. Study time:  09:55 AM.  -------------------------------------------------------------------  ------------------------------------------------------------------- Baseline ECG:  Normal.  ------------------------------------------------------------------- Stress protocol:  +--------+---------------+---+------------+--------+ !Stage   !Time into phase!HR !BP (mmHg)   !Symptoms! +--------+---------------+---+------------+--------+ !Baseline!---------------!67 !138/81 (100)!None    ! +--------+---------------+---+------------+--------+ !Stage 1 !---------------!87 !------------!--------! +--------+---------------+---+------------+--------+ !Stage 2 !---------------!890!781/31 (118)!--------! +--------+---------------+---+------------+--------+ !Stage 3 !---------------!127!------------!--------! +--------+---------------+---+------------+--------+ !Recovery!4 min 10 sec   !66 !181/70 (107)!--------! +--------+---------------+---+------------+--------+  ------------------------------------------------------------------- Stress results:   Maximal heart rate during stress was 127 bpm (92% of maximal predicted heart rate). The maximal predicted heart rate was 138 bpm.The target heart rate was achieved. The heart rate response to stress was normal. There was a normal resting blood pressure with an appropriate response to stress. The rate-pressure product for the peak heart rate and blood pressure was 76237 mm Hg/min.  The patient experienced no chest pain during stress.  ------------------------------------------------------------------- Stress ECG:  There were no stress arrhythmias or conduction abnormalities.  The stress ECG was negative for ischemia.  ------------------------------------------------------------------- Baseline:  - The estimated LV ejection fraction was 55%. - Normal wall motion; no LV regional wall motion abnormalities.  Peak stress:  - The estimated LV ejection fraction was  65%. - Normal wall motion; no LV regional wall motion abnormalities.  ------------------------------------------------------------------- Stress echo results:     Left ventricular ejection fraction was normal at rest and with stress. There was no echocardiographic evidence for stress-induced ischemia.  ------------------------------------------------------------------- Measurements  Left ventricle                              Value        Reference LV ID, ED, PLAX chordal             (H)     59    mm     43 - 52  LV ID, ES, PLAX chordal             (H)     39    mm     23 - 38 LV fx shortening, PLAX chordal              34    %      >=29 LV PW thickness, ED                         14    mm     --------- IVS/LV PW ratio, ED                         1            <=1.3  Ventricular septum                          Value        Reference IVS thickness, ED                           14    mm     ---------  Aorta                                       Value        Reference Aortic root ID, ED                          35    mm     ---------  Left atrium                                 Value        Reference LA ID, A-P, ES                              44    mm     --------- LA ID/bsa, A-P                              2.12  cm/m^2 <=2.2  Tricuspid valve                             Value        Reference Tricuspid maximal inflow velocity,          255   cm/s   --------- PISA Tricuspid regurg peak velocity              252   cm/s   --------- Tricuspid peak RV-RA gradient               25    mm Hg  ---------  Legend: (L)  and  (H)  mark values outside specified reference range.  ------------------------------------------------------------------- Prepared and Electronically Authenticated by  Lamar Fitch, MD 2020-01-06T12:54:30   ECHOCARDIOGRAM  ECHOCARDIOGRAM COMPLETE 06/23/2023  Narrative ECHOCARDIOGRAM REPORT    Patient Name:   Ryan Mendoza Date of Exam: 06/23/2023 Medical  Rec #:  [id number]  Height:       66.0 in Accession #:    [id number]    Weight:       170.0 lb Date of Birth:  Aug 14, 1934    BSA:          1.866 m Patient Age:    88 years      BP:           158/66 mmHg Patient Gender: M             HR:           51 bpm. Exam Location:  [address]  Procedure: 2D Echo, Cardiac Doppler and Color Doppler  Indications:    I35.1 Nonrheumatic aortic (valve) insufficiency  History:        Patient has prior history of Echocardiogram examinations, most recent 01/05/2022. Previous Myocardial Infarction and CAD; Risk Factors:Hypertension, Dyslipidemia and Sleep Apnea. S/P mitral valve clip implantation. Bradycardia.  Sonographer:    Jon Hacker RCS Referring Phys: DEBBY CHRISTELLA BLANCH  IMPRESSIONS   1. Left ventricular ejection fraction, by estimation, is 50 to 55%. The left ventricle has low normal function. The left ventricle has no regional wall motion abnormalities. There is mild concentric left ventricular hypertrophy. Left ventricular diastolic parameters are indeterminate. 2. Right ventricular systolic function is normal. The right ventricular size is normal. 3. Left atrial size was mildly dilated. 4. S/p mitraclip (01/2021), largest jet anteriorly directed. Moderate to severe mitral valve regurgitation. 5. AI is eccentric. Aortic valve regurgitation is mild to moderate. Aortic valve sclerosis/calcification is present, without any evidence of aortic stenosis. 6. Pulmonic valve regurgitation is moderate. 7. There is mild dilatation of the ascending aorta, measuring 41 mm.  Comparison(s): No significant change from prior study.  FINDINGS Left Ventricle: Left ventricular ejection fraction, by estimation, is 50 to 55%. The left ventricle has low normal function. The left ventricle has no regional wall motion abnormalities. The left ventricular internal cavity size was normal in size. There is mild concentric left ventricular hypertrophy. Left ventricular  diastolic parameters are indeterminate.  Right Ventricle: The right ventricular size is normal. Right ventricular systolic function is normal.  Left Atrium: Left atrial size was mildly dilated.  Right Atrium: Right atrial size was normal in size.  Pericardium: There is no evidence of pericardial effusion.  Mitral Valve: S/p mitraclip (01/2021), largest jet anteriorly directed. Moderate to severe mitral valve regurgitation. MV peak gradient, 5.7 mmHg. The mean mitral valve gradient is 2.0 mmHg.  Tricuspid Valve: The tricuspid valve is normal in structure. Tricuspid valve regurgitation is trivial.  Aortic Valve: AI is eccentric. Aortic valve regurgitation is mild to moderate. Aortic regurgitation PHT measures 616 msec. Aortic valve sclerosis/calcification is present, without any evidence of aortic stenosis.  Pulmonic Valve: The pulmonic valve was not well visualized. Pulmonic valve regurgitation is moderate.  Aorta: There is mild dilatation of the ascending aorta, measuring 41 mm.  IAS/Shunts: The interatrial septum was not well visualized.   LEFT VENTRICLE PLAX 2D LVIDd:         5.10 cm LVIDs:         3.60 cm LV PW:         1.30 cm LV IVS:        1.30 cm LVOT diam:     2.30 cm LV SV:         75 LV SV Index:   40 LVOT Area:     4.15 cm   RIGHT VENTRICLE RV Basal diam:  4.50  cm RV S prime:     12.40 cm/s TAPSE (M-mode): 3.0 cm RVSP:           17.1 mmHg  LEFT ATRIUM             Index        RIGHT ATRIUM           Index LA diam:        4.70 cm 2.52 cm/m   RA Pressure: 3.00 mmHg LA Vol (A2C):   62.0 ml 33.22 ml/m  RA Area:     20.10 cm LA Vol (A4C):   66.0 ml 35.36 ml/m  RA Volume:   48.80 ml  26.15 ml/m LA Biplane Vol: 68.3 ml 36.60 ml/m AORTIC VALVE             PULMONIC VALVE LVOT Vmax:   97.40 cm/s  PR End Diast Vel: 4.08 msec LVOT Vmean:  55.600 cm/s LVOT VTI:    0.181 m AI PHT:      616 msec  AORTA Ao Root diam: 3.40 cm Ao Asc diam:  3.95 cm  MITRAL VALVE                 TRICUSPID VALVE MV Area (PHT): 1.61 cm     TR Peak grad:   14.1 mmHg MV Area VTI:   1.96 cm     TR Vmax:        188.00 cm/s MV Peak grad:  5.7 mmHg     Estimated RAP:  3.00 mmHg MV Mean grad:  2.0 mmHg     RVSP:           17.1 mmHg MV Vmax:       1.19 m/s MV Vmean:      69.5 cm/s    SHUNTS MV Decel Time: 472 msec     Systemic VTI:  0.18 m MR Peak grad: 38.7 mmHg     Systemic Diam: 2.30 cm MR Mean grad: 26.5 mmHg MR Vmax:      311.00 cm/s MR Vmean:     245.0 cm/s MV E velocity: 80.70 cm/s MV A velocity: 124.00 cm/s MV E/A ratio:  0.65  Photographer signed by Ronal Ross Signature Date/Time: 06/23/2023/2:36:25 PM    Final   TEE  ECHO TEE 01/23/2021  Narrative TRANSESOPHOGEAL ECHO REPORT    Patient Name:   Ryan Mendoza Date of Exam: 01/23/2021 Medical Rec #:  [id number]     Height:       67.0 in Accession #:    [id number]    Weight:       176.2 lb Date of Birth:  1934/10/29    BSA:          1.916 m Patient Age:    86 years      BP:           122/46 mmHg Patient Gender: M             HR:           56 bpm. Exam Location:  Inpatient  Procedure: Transesophageal Echo, Color Doppler, Cardiac Doppler and 3D Echo  Indications:     Severe mitral regurgitation [[id number]]  History:         Patient has prior history of Echocardiogram examinations, most recent 10/13/2020. CAD and Previous Myocardial Infarction, Mitral Valve Disease, Arrythmias:PAC and Bradycardia, Signs/Symptoms:Dyspnea; Risk Factors:Hypertension and Sleep Apnea.  Mitral Valve: Mitra Clip NTW valve is present in the mitral position. Procedure  Date: 01/23/2021.  Sonographer:     Annabella Fell RDCS Referring Phys:  [phone] MICHAEL COOPER Diagnosing Phys: Maude Emmer MD  PROCEDURE: After discussion of the risks and benefits of a TEE, an informed consent was obtained from the patient. The patient was intubated. The transesophogeal probe was passed without difficulty through the esophogus  of the patient. Imaged were obtained with the patient in a supine position. Sedation performed by different physician. The patient was monitored while under deep sedation. Anesthestetic sedation was provided intravenously by Anesthesiology: 100mg  of Propofol , 60mg  of Lidocaine . The patient's vital signs; including heart rate, blood pressure, and oxygen  saturation; remained stable throughout the procedure. The patient developed no complications during the procedure.  IMPRESSIONS   1. Left ventricular ejection fraction, by estimation, is 60 to 65%. The left ventricle has normal function. 2. Right ventricular systolic function is normal. The right ventricular size is normal. 3. Left atrial size was moderately dilated. No left atrial/left atrial appendage thrombus was detected. 4. Right atrial size was moderately dilated. 5. Pre-Clip: Severe MR Two eccentric jets. Largest from large prolapsing A1 segment directed posteriorly. Smaller anteriorly directed jet from small flail medial P2 segment MVA 6.45 cm2 mean grdient 2 mmHg  Post Clip A single NTW clip placed between A12/P2 mild grade 1 residual posteriorly directed MR Mean gradient 3 mmHg MVA 4.39 cm2 . The mitral valve is abnormal. Severe mitral valve regurgitation. No evidence of mitral stenosis. There is a Systems developer NTW present in the mitral position. Procedure Date: 01/23/2021. 6. Tricuspid valve regurgitation is moderate. 7. The aortic valve is tricuspid. Aortic valve regurgitation is moderate to severe. 8. Post transeptal left to right shunting only.  FINDINGS Left Ventricle: Left ventricular ejection fraction, by estimation, is 60 to 65%. The left ventricle has normal function. The left ventricular internal cavity size was normal in size. There is no left ventricular hypertrophy.  Right Ventricle: The right ventricular size is normal. No increase in right ventricular wall thickness. Right ventricular systolic function is normal.  Left  Atrium: Left atrial size was moderately dilated. No left atrial/left atrial appendage thrombus was detected.  Right Atrium: Right atrial size was moderately dilated.  Pericardium: There is no evidence of pericardial effusion.  Mitral Valve: Pre-Clip: Severe MR Two eccentric jets. Largest from large prolapsing A1 segment directed posteriorly. Smaller anteriorly directed jet from small flail medial P2 segment MVA 6.45 cm2 mean grdient 2 mmHg  Post Clip A single NTW clip placed between A12/P2 mild grade 1 residual posteriorly directed MR Mean gradient 3 mmHg MVA 4.39 cm2. The mitral valve is abnormal. Severe mitral valve regurgitation. There is a Systems developer NTW present in the mitral position. Procedure Date: 01/23/2021. No evidence of mitral valve stenosis. MV peak gradient, 6.7 mmHg. The mean mitral valve gradient is 3.0 mmHg.  Tricuspid Valve: The tricuspid valve is normal in structure. Tricuspid valve regurgitation is moderate.  Aortic Valve: The aortic valve is tricuspid. Aortic valve regurgitation is moderate to severe.  Pulmonic Valve: The pulmonic valve was normal in structure. Pulmonic valve regurgitation is mild.  Aorta: The aortic root is normal in size and structure.  IAS/Shunts: Post transeptal left to right shunting only.   LEFT VENTRICLE PLAX 2D LVOT diam:     2.30 cm LV SV:         108 LV SV Index:   57 LVOT Area:     4.15 cm   AORTIC VALVE LVOT Vmax:   106.00 cm/s LVOT Vmean:  64.600 cm/s LVOT VTI:    0.261 m  MITRAL VALVE MV Area (PHT): 2.10 cm      SHUNTS MV Area VTI:   2.16 cm      Systemic VTI:  0.26 m MV Peak grad:  6.7 mmHg      Systemic Diam: 2.30 cm MV Mean grad:  3.0 mmHg MV Vmax:       1.29 m/s MV Vmean:      79.4 cm/s MR Peak grad:    92.2 mmHg MR Mean grad:    61.0 mmHg MR Vmax:         480.00 cm/s MR Vmean:        367.0 cm/s MR PISA:         1.57 cm MR PISA Eff ROA: 13 mm MR PISA Radius:  0.50 cm  Maude Emmer MD Electronically signed  by Maude Emmer MD Signature Date/Time: 01/23/2021/1:54:07 PM    Final  MONITORS  LONG TERM MONITOR (3-14 DAYS) 01/27/2022  Narrative Enrollment 01/14/2022-01/20/2022 (6 days 2 hours). Patient had a min HR of 42 bpm (sinus bradycardia), max HR of 182 bpm (5 beats supraventricular tachycardia, 1.6 second duration), and avg HR of 57 bpm (sinus bradycardia). Predominant underlying rhythm was Sinus Rhythm. 35 Supraventricular Tachycardia runs occurred, the run with the fastest interval lasting 5 beats with (1.6 seconds) a max rate of 182 bpm, the longest lasting 19 beats (10.1 second duration) with an avg rate of 110 bpm. Isolated SVEs were rare (<1.0%), SVE Couplets were rare (<1.0%), and SVE Triplets were rare (<1.0%). Isolated VEs were rare (<1.0%), VE Couplets were rare (<1.0%), and no VE Triplets were present. Ventricular Bigeminy was present.  Impression: 1. Brief ectopic atrial tachycardia detected (longest 10.1 seconds). 2. No significant bradycardia. 3. Rare ectopy.  Darryle T. Barbaraann, MD, [facility]  [facility] [address] [place], [place] [address] [phone] 1:53 PM       ______________________________________________________________________________________________       Laboratory Data: High Sensitivity Troponin:   Recent Labs  Lab 09/28/24 2324 09/29/24 0132  TROPONINIHS 12 15     Chemistry Recent Labs  Lab 09/28/24 2324  NA 136  K 4.2  CL 105  CO2 21*  GLUCOSE 123*  BUN 49*  CREATININE 2.57*  CALCIUM  8.9  GFRNONAA 23*  ANIONGAP 10    No results for input(s): PROT, ALBUMIN, AST, ALT, ALKPHOS, BILITOT in the last 168 hours. Lipids No results for input(s): CHOL, TRIG, HDL, LABVLDL, LDLCALC, CHOLHDL in the last 168 hours.  Hematology Recent Labs  Lab 09/28/24 2324  WBC 8.1  RBC 4.51  HGB 13.2  HCT 40.1  MCV 88.9  MCH 29.3  MCHC 32.9  RDW 14.4  PLT 170   Thyroid  No results for input(s): TSH,  FREET4 in the last 168 hours.  BNPNo results for input(s): BNP, PROBNP in the last 168 hours.  DDimer No results for input(s): DDIMER in the last 168 hours.  Radiology/Studies:  DG Chest 2 View Result Date: 09/28/2024 EXAM: 2 VIEW(S) XRAY OF THE CHEST 09/28/2024 11:29:46 PM COMPARISON: Chest x-ray 12:32:3  and CT chest 142 021. CLINICAL HISTORY: Chest pain chest pain. FINDINGS: LUNGS AND PLEURA: There is a small nodular density in the right upper lobe which is not definitely seen on prior. No pulmonary edema. No pleural effusion. No pneumothorax. HEART AND MEDIASTINUM: No acute abnormality of the cardiac and mediastinal silhouettes. BONES AND SOFT TISSUES: No acute osseous abnormality. IMPRESSION: 1. No  acute findings. 2. Small nodular density in the right upper lobe, indeterminate. Recommend follow-up non-emergent chest CT. Electronically signed by: Greig Pique MD 09/28/2024 11:48 PM EDT RP Workstation: [id number]     Assessment and Plan:  CAD  Chest pain  - Patient previously had stenting of the LAD and left circumflex in 2017  - Most recent cath in 05/2023 showed patent left main with no significant stenosis, patent LAD stent with mild nonobstructive plaquing in the LAD. Patent left circumflex stent with 50% in-stent restenosis and 50-60% ostial circumflex stenosis. Diffuse nonobstructive RCA stenosis  - Now presented with atypical chest pain. Feels like shocks followed by pressure. Occurs randomly, not associated with exertion - EKG without ischemic changes. hsTn neg x2  - Echocardiogram pending  - Not cath candidate with CKD and creatinine 2.57. Additionally, I do not think cath is warranted with atypical symptoms. Plan for outpatient stress PET  - Increase imdur  to 60 mg daily  - Continue amlodipine  5 mg daily  - Continue lipitor 40 mg daily   Mitral regurgitation  S/p mitraclip - Previously had mitraclip in 2022 - Most recent echocardiogram from 06/2023 showed EF 50-55%, no  regional wall motion abnormalities, normal RV systolic function, moderate-severe MR  - Echo pending this admission  - Continue lasix  40 mg three times weekly. He appears euvolemic on exam   HTN  - Continue amlodipine  5 mg daily, clonidine  0.1 mg BID. Increase imdur  to 60 mg for antianginal benefit as above   HLD  - Lipid panel from 01/2023 showed LDL 66  - Continue lipitor 40 mg daily   CKD stage IIIb  - Creatinine previously 2.08 in 07/2024. Creatinine 2.57 on admission  - Follows with nephrology   Risk Assessment/Risk Scores:       For questions or updates, please contact [facility] Please consult [email or web address] for contact info under   Signed, Rollo FABIENE Louder, PA-C  09/29/2024 12:32 PM  Patient seen and examined.  Agree with above documentation.  Ryan Mendoza is an 89 year old male with a history of CAD, mitral regurgitation status post MitraClip, CKD stage III, hypertension who we are consulted by Dr. Arlice for chest pain evaluation.  He has a history of CAD, underwent stenting of LAD in LCx in 2017.  He was found to have severe mitral regurgitation on TEE 09/2020.  LHC/RHC 12/2020 showed patent LAD stent, patent LCx stent with 50% diffuse in-stent restenosis and 50% ostial stenosis, diffuse nonobstructive RCA stenosis.  He underwent MitraClip on 01/2021.  Most recent cath 05/2023 showed no change in CAD from 2022.  Echocardiogram 06/2023 showed moderate to severe mitral regurgitation.  He presented to ED yesterday with chest pain.  Initial vital signs notable for BP 169/66, pulse 60, SpO2 93% on room air.  Labs notable for creatinine 2.57, troponin 12 >15, WBC 8.1, hemoglobin 13.2.  EKG showed sinus bradycardia, rate 59, LVH, nonspecific T wave flattening.  On exam, patient is alert and oriented, regular rate and rhythm, 2/6 systolic murmur, lungs CTAB, no LE edema or JVD.  For his chest pain, given atypical description of pain and troponins negative, do not recommend inpatient  ischemic evaluation.  Given his history of CAD status post LAD and LCx stent, and considering his renal disease, would recommend outpatient stress PET to evaluate for ischemia.  In the meantime, he has been hypertensive, will titrate up his Imdur .  Will follow-up echocardiogram.  Lonni LITTIE Nanas, MD

## 2024-09-30 ENCOUNTER — Other Ambulatory Visit: Payer: Self-pay | Admitting: Physician Assistant

## 2024-09-30 DIAGNOSIS — R072 Precordial pain: Secondary | ICD-10-CM

## 2024-09-30 DIAGNOSIS — R079 Chest pain, unspecified: Secondary | ICD-10-CM | POA: Diagnosis not present

## 2024-09-30 DIAGNOSIS — I25119 Atherosclerotic heart disease of native coronary artery with unspecified angina pectoris: Secondary | ICD-10-CM

## 2024-09-30 LAB — CBC
HCT: 36.5 % — ABNORMAL LOW (ref 39.0–52.0)
Hemoglobin: 12 g/dL — ABNORMAL LOW (ref 13.0–17.0)
MCH: 29.2 pg (ref 26.0–34.0)
MCHC: 32.9 g/dL (ref 30.0–36.0)
MCV: 88.8 fL (ref 80.0–100.0)
Platelets: 163 K/uL (ref 150–400)
RBC: 4.11 MIL/uL — ABNORMAL LOW (ref 4.22–5.81)
RDW: 14.3 % (ref 11.5–15.5)
WBC: 6.4 K/uL (ref 4.0–10.5)
nRBC: 0 % (ref 0.0–0.2)

## 2024-09-30 LAB — BASIC METABOLIC PANEL WITH GFR
Anion gap: 10 (ref 5–15)
BUN: 44 mg/dL — ABNORMAL HIGH (ref 8–23)
CO2: 25 mmol/L (ref 22–32)
Calcium: 9 mg/dL (ref 8.9–10.3)
Chloride: 107 mmol/L (ref 98–111)
Creatinine, Ser: 2.45 mg/dL — ABNORMAL HIGH (ref 0.61–1.24)
GFR, Estimated: 25 mL/min — ABNORMAL LOW (ref >60)
Glucose, Bld: 112 mg/dL — ABNORMAL HIGH (ref 70–99)
Potassium: 4.8 mmol/L (ref 3.5–5.1)
Sodium: 142 mmol/L (ref 135–145)

## 2024-09-30 MED ORDER — ISOSORBIDE MONONITRATE ER 60 MG PO TB24: 60.0000 mg | ORAL_TABLET | Freq: Every day | ORAL | 0 refills | Status: AC

## 2024-09-30 MED ORDER — LAMOTRIGINE 25 MG PO TABS: 25.0000 mg | ORAL_TABLET | Freq: Every day | ORAL | Status: AC

## 2024-09-30 NOTE — Progress Notes (Signed)
 Progress Note  Patient Name: Ryan Mendoza Date of Encounter: 09/30/2024  Primary Cardiologist: Darryle ONEIDA Decent, MD   Subjective   Dyspnea is improved. No chest pain.   Inpatient Medications    Scheduled Meds:  amLODipine   5 mg Oral Daily   aspirin  EC  81 mg Oral Daily   atorvastatin   40 mg Oral Daily   cloNIDine   0.1 mg Oral BID   escitalopram  5 mg Oral QHS   famotidine   40 mg Oral q AM   heparin   5,000 Units Subcutaneous Q8H   isosorbide  mononitrate  60 mg Oral Daily   lamoTRIgine  25 mg Oral QHS   levothyroxine   50 mcg Oral Q0600   Continuous Infusions:  PRN Meds: acetaminophen  **OR** acetaminophen , acetaminophen , albuterol, bisacodyl, hydrALAZINE , nitroGLYCERIN , oxyCODONE, polyethylene glycol, prochlorperazine   Vital Signs    Vitals:   09/29/24 2301 09/29/24 2337 09/30/24 0306 09/30/24 0915  BP: 108/72 139/66 136/60 (!) 173/64  Pulse: 68 71 (!) 53 (!) 51  Resp: 14 15 16 18   Temp: 98.4 F (36.9 C) 98.2 F (36.8 C) (!) 97.5 F (36.4 C) 97.8 F (36.6 C)  TempSrc: Oral Oral Oral Oral  SpO2: 97% 100% 94% 98%  Weight:  79.3 kg    Height:  5' 6 (1.676 m)      Intake/Output Summary (Last 24 hours) at 09/30/2024 1042 Last data filed at 09/30/2024 0600 Gross per 24 hour  Intake 240 ml  Output 350 ml  Net -110 ml   Filed Weights   09/29/24 2337  Weight: 79.3 kg    Telemetry    Sinus brady at 45/min - Personally Reviewed  ECG    none - Personally Reviewed  Physical Exam   GEN: elderly man, o acute distress.   Neck: No JVD Cardiac: RRR, no murmurs, rubs, or gallops.  Respiratory: Clear to auscultation bilaterally. GI: Soft, nontender, non-distended  MS: No edema; No deformity. Neuro:  Nonfocal  Psych: Normal affect   Labs    Chemistry Recent Labs  Lab 09/28/24 2324 09/30/24 0455  NA 136 142  K 4.2 4.8  CL 105 107  CO2 21* 25  GLUCOSE 123* 112*  BUN 49* 44*  CREATININE 2.57* 2.45*  CALCIUM  8.9 9.0  GFRNONAA 23* 25*  ANIONGAP  10 10     Hematology Recent Labs  Lab 09/28/24 2324 09/30/24 0455  WBC 8.1 6.4  RBC 4.51 4.11*  HGB 13.2 12.0*  HCT 40.1 36.5*  MCV 88.9 88.8  MCH 29.3 29.2  MCHC 32.9 32.9  RDW 14.4 14.3  PLT 170 163    Cardiac EnzymesNo results for input(s): TROPONINI in the last 168 hours. No results for input(s): TROPIPOC in the last 168 hours.   BNPNo results for input(s): BNP, PROBNP in the last 168 hours.   DDimer No results for input(s): DDIMER in the last 168 hours.   Radiology    ECHOCARDIOGRAM COMPLETE Result Date: 09/29/2024    ECHOCARDIOGRAM REPORT   Patient Name:   Ryan Mendoza Date of Exam: 09/29/2024 Medical Rec #:  [id number]     Height:       66.0 in Accession #:    [id number]    Weight:       183.2 lb Date of Birth:  03/08/1934    BSA:          1.927 m Patient Age:    89 years      BP:  149/52 mmHg Patient Gender: M             HR:           56 bpm. Exam Location:  Inpatient Procedure: 2D Echo, 3D Echo, Cardiac Doppler and Color Doppler (Both Spectral            and Color Flow Doppler were utilized during procedure). Indications:    Abnormal ECG 794.31/R94.31  History:        Patient has prior history of Echocardiogram examinations, most                 recent 06/23/2023. Previous Myocardial Infarction and CAD,                 Abnormal ECG, Mitral Valve Disease and s/p Mitral valve repair,                 there is a MitraClip present in the Mitral position, procedure                 date: 01/23/21, Signs/Symptoms:Chest Pain; Risk                 Factors:Hypertension, Dyslipidemia and Sleep Apnea.                  Mitral Valve: Mitra-Clip valve is present in the mitral                 position. Procedure Date: 01/23/2021.  Sonographer:    Koleen Popper RDCS Referring Phys: [id number] BINAYA DAHAL IMPRESSIONS  1. Left ventricular ejection fraction, by estimation, is 50 to 55%. Left ventricular ejection fraction by 3D volume is 49 %. The left ventricle has low normal  function. The left ventricle demonstrates global hypokinesis. The left ventricular internal cavity size was moderately dilated. There is mild concentric left ventricular hypertrophy. Left ventricular diastolic parameters are indeterminate.  2. Right ventricular systolic function is normal. The right ventricular size is normal. There is normal pulmonary artery systolic pressure.  3. Right atrial size was mild to moderately dilated.  4. The mitral valve has been repaired/replaced. Moderate to severe mitral valve regurgitation. Mild mitral stenosis. The mean mitral valve gradient is 3.0 mmHg with average heart rate of 56 bpm. There is a Mitra-Clip present in the mitral position. Procedure Date: 01/23/2021. Echo findings are consistent with regurgitation the mitral prosthesis.  5. The aortic valve is tricuspid. Aortic valve regurgitation is mild. Aortic valve sclerosis/calcification is present, without any evidence of aortic stenosis.  6. Aortic dilatation noted. There is mild dilatation of the ascending aorta, measuring 42 mm.  7. The inferior vena cava is normal in size with greater than 50% respiratory variability, suggesting right atrial pressure of 3 mmHg. Comparison(s): A prior study was performed on 06/23/2023. The LV cavity has dilated from 5.1 cm to 6.4 cm. Otherwise, no significant changes. Conclusion(s)/Recommendation(s): Findings concerning for moderate to severe mitral regurgitation in the setting of Mitra Clip, would recommend Transesophageal Echocardiogram for clarification. Critical findings reported to Chapman Rota, MD and acknowledged. FINDINGS  Left Ventricle: Left ventricular ejection fraction, by estimation, is 50 to 55%. Left ventricular ejection fraction by 3D volume is 49 %. The left ventricle has low normal function. The left ventricle demonstrates global hypokinesis. The left ventricular internal cavity size was moderately dilated. There is mild concentric left ventricular hypertrophy. Left  ventricular diastolic function could not be evaluated due to mitral valve repair. Left ventricular diastolic parameters are indeterminate. Right Ventricle: The right  ventricular size is normal. Right vetricular wall thickness was not well visualized. Right ventricular systolic function is normal. There is normal pulmonary artery systolic pressure. The tricuspid regurgitant velocity is 2.63 m/s, and with an assumed right atrial pressure of 3 mmHg, the estimated right ventricular systolic pressure is 30.7 mmHg. Left Atrium: Left atrial size was normal in size. Right Atrium: Right atrial size was mild to moderately dilated. Pericardium: There is no evidence of pericardial effusion. Mitral Valve: The mitral valve has been repaired/replaced. Moderate to severe mitral valve regurgitation, with eccentric anteriorly directed jet. There is a Mitra-Clip present in the mitral position. Procedure Date: 01/23/2021. Echo findings are consistent with blood flow regurgitation of the mitral prosthesis. Mild mitral valve stenosis. MV peak gradient, 6.2 mmHg. The mean mitral valve gradient is 3.0 mmHg with average heart rate of 56 bpm. Tricuspid Valve: The tricuspid valve is normal in structure. Tricuspid valve regurgitation is mild. Aortic Valve: The aortic valve is tricuspid. Aortic valve regurgitation is mild. Aortic valve sclerosis/calcification is present, without any evidence of aortic stenosis. Pulmonic Valve: The pulmonic valve was not well visualized. Pulmonic valve regurgitation is mild. Aorta: The aortic root is normal in size and structure and aortic dilatation noted. There is mild dilatation of the ascending aorta, measuring 42 mm. Venous: The inferior vena cava is normal in size with greater than 50% respiratory variability, suggesting right atrial pressure of 3 mmHg. IAS/Shunts: The interatrial septum was not well visualized. Additional Comments: 3D was performed not requiring image post processing on an independent  workstation and was abnormal.  LEFT VENTRICLE PLAX 2D LVIDd:         6.40 cm         Diastology LVIDs:         4.40 cm         LV e' medial:    5.44 cm/s LV PW:         1.30 cm         LV E/e' medial:  22.6 LV IVS:        1.30 cm         LV e' lateral:   7.29 cm/s LVOT diam:     2.00 cm         LV E/e' lateral: 16.9 LV SV:         83 LV SV Index:   43 LVOT Area:     3.14 cm        3D Volume EF                                LV 3D EF:    Left                                             ventricul                                             ar                                             ejection  fraction                                             by 3D                                             volume is                                             49 %.                                 3D Volume EF:                                3D EF:        49 %                                LV EDV:       294 ml                                LV ESV:       149 ml                                LV SV:        145 ml RIGHT VENTRICLE             IVC RV S prime:     19.70 cm/s  IVC diam: 1.20 cm TAPSE (M-mode): 3.2 cm LEFT ATRIUM             Index        RIGHT ATRIUM           Index LA diam:        4.70 cm 2.44 cm/m   RA Area:     19.30 cm LA Vol (A2C):   47.9 ml 24.86 ml/m  RA Volume:   55.00 ml  28.55 ml/m LA Vol (A4C):   41.7 ml 21.64 ml/m LA Biplane Vol: 45.5 ml 23.62 ml/m  AORTIC VALVE LVOT Vmax:   115.00 cm/s LVOT Vmean:  72.800 cm/s LVOT VTI:    0.263 m  AORTA Ao Root diam: 3.30 cm Ao Asc diam:  4.20 cm MITRAL VALVE                TRICUSPID VALVE MV Area (PHT): 2.46 cm     TR Peak grad:   27.7 mmHg MV Area VTI:   1.78 cm     TR Vmax:        263.00 cm/s MV Peak grad:  6.2 mmHg MV Mean grad:  3.0 mmHg     SHUNTS MV Vmax:       1.24 m/s     Systemic VTI:  0.26 m MV Vmean:  77.7 cm/s    Systemic Diam: 2.00 cm MV Decel Time: 308 msec MV E velocity: 123.00 cm/s MV A velocity:  116.00 cm/s MV E/A ratio:  1.06 Emeline Calender Electronically signed by Emeline Calender Signature Date/Time: 09/29/2024/5:18:51 PM    Final    DG Chest 2 View Result Date: 09/28/2024 EXAM: 2 VIEW(S) XRAY OF THE CHEST 09/28/2024 11:29:46 PM COMPARISON: Chest x-ray 12:32:3  and CT chest 142 021. CLINICAL HISTORY: Chest pain chest pain. FINDINGS: LUNGS AND PLEURA: There is a small nodular density in the right upper lobe which is not definitely seen on prior. No pulmonary edema. No pleural effusion. No pneumothorax. HEART AND MEDIASTINUM: No acute abnormality of the cardiac and mediastinal silhouettes. BONES AND SOFT TISSUES: No acute osseous abnormality. IMPRESSION: 1. No acute findings. 2. Small nodular density in the right upper lobe, indeterminate. Recommend follow-up non-emergent chest CT. Electronically signed by: Greig Pique MD 09/28/2024 11:48 PM EDT RP Workstation: [id number]    Cardiac Studies   See above  Patient Profile     89 y.o. male admitted with chest pain with known CAD.  Assessment & Plan    Chest pain - note enzymes negative and chest pain is resolved. Plan for outpatient stress PET as per Dr. Kate. Note recs to increase imdur  to 50 mg daily. Stage 3B renal failure -his creatinine is stable. No change in meds.  Chronic systolic and diastolic CHF - Lasix  40 mg 3 times a week recommended.  [facility] will sign off.   The patient is ready for discharge today from a cardiac standpoint. Medication Recommendations:  see above Other recommendations (labs, testing, etc):  Stress PET in the next month Follow up as an outpatient:  with Dr. Barbaraann in after PET.   For questions or updates, please contact [facility] Please consult [email or web address] for contact info under Cardiology/STEMI.      Signed, Danelle Birmingham, MD  09/30/2024, 10:42 AM  Patient ID: Landy LELON Moats, male   DOB: Feb 06, 1934, 89 y.o.   MRN: [id number]

## 2024-09-30 NOTE — Care Management Obs Status (Signed)
 MEDICARE OBSERVATION STATUS NOTIFICATION   Patient Details  Name: Ryan Mendoza MRN: [id number] Date of Birth: 03-03-1934   Medicare Observation Status Notification Given:  Yes    Siani Utke G., RN 09/30/2024, 8:42 AM

## 2024-09-30 NOTE — Plan of Care (Signed)

## 2024-09-30 NOTE — Plan of Care (Signed)
  Problem: Clinical Measurements: Goal: Ability to maintain clinical measurements within normal limits will improve Outcome: Progressing Goal: Diagnostic test results will improve Outcome: Progressing Goal: Cardiovascular complication will be avoided Outcome: Progressing   Problem: Activity: Goal: Risk for activity intolerance will decrease Outcome: Progressing   Problem: Pain Managment: Goal: General experience of comfort will improve and/or be controlled Outcome: Progressing

## 2024-09-30 NOTE — Discharge Summary (Signed)
 Physician Discharge Summary  Ryan Mendoza FMW:[id number] DOB: 02-15-1934 DOA: 09/28/2024  PCP: Magdaline Debby HERO, MD  Admit date: 09/28/2024 Discharge date: 09/30/2024  Admitted from: Home Discharge disposition: Home  Recommendations at discharge:  Losartan has been held because of acute impairment in kidney function.  Imdur  dose has been increased from 30 mg to 60 mg daily Continue other medicines as before Follow-up with cardiology and nephrology as an outpatient   Subjective: Patient was seen and examined this morning.  Lying down in bed.  Not in distress.  Feels better.  Chest pain, shortness of breath improving.  Wants to go home today.  Seen by cardiology earlier. Chart reviewed. Afebrile, heart rate in 50s to 70s, blood pressure 170s this morning, breathing on room air. Labs this morning with BUNs/creatinine 44/2.45.  Brief narrative: Ryan Mendoza is a 89 y.o. male with PMH significant for HTN, HLD, OSA, CAD s/p stent, CKD, MR s/p MitraClip 2022, AVR, BPH, GERD, hypothyroidism,, primary hyperparathyroidism. Patient presented to ED last night with complaint of central chest pain for several days with shortness of breath, palpitation.  Chest pain is intermittent shooting kind of pain in the precordial area and is not related to exertion but shortness of breath worse with exertion..  He has been taking nitroglycerin  frequently over the last 3 days with improvement in symptoms but only to quickly recur.  In the ED, patient was afebrile, heart rate 40s and 50s, blood pressure 169/66, breathing on room air Labs with troponin negative x 2, CBC unremarkable, BMP with BUN/creatinine 49/2.57 Chest x-ray with no acute finding and a small RUL nodule EKG with sinus bradycardia, QTc 413 ms, LVH, nonspecific ST-T wave changes Admitted to [facility] Cardiology consulted  10/17, echo showed EF of 50 to 55%, LV global hypokinesis, mild LVH, moderately dilated RV size, moderate to severe MR in the  setting of MitraClip.  Hospital course: Chest pain at rest Presented with intermittent sharp precordial pain for the last 3 days Troponin negative.   EKG without acute ischemic findings Echocardiogram without WMA. Seen by cardiology.   Likely atypical noncardiac etiology.  But given history of CAD/stent, outpatient stress PET planned to evaluate ischemia Recent Labs    09/28/24 2324 09/29/24 0132  TROPONINIHS 12 15   CAD s/p stent HLD PTA meds- aspirin  81 mg daily, Lipitor 40 mg daily, Imdur  30 mg daily Continue continued on aspirin  and Lipitor.  Imdur  dose has been increased to 60 mg daily   Mild systolic CHF H/o MR s/p MitraClip 2022,  AVR HTN Most recent echo from July 2024 with EF 50 to 55%, no WMA, mild concentric LVH, moderate to severe MR, mild to moderate AI, moderate pulm regurgitation. 10/17, echo showed EF of 50 to 55%, LV global hypokinesis, mild LVH, moderately dilated RV size, moderate to severe MR in the setting of MitraClip. I discussed the findings with cardiologist Dr. Waddell this morning.  Recommended outpatient follow-up.  Probably not a candidate for further inpatient intervention. PTA meds- amlodipine  5 mg daily, clonidine  0.1 mg twice daily, Imdur  30 mg daily, losartan 25 mg daily, Lasix  40 mg 3 times a week Given AKI, losartan was held.  Continue amlodipine , clonidine , Lasix  and increased dose of Imdur .  AKI on CKD 3b Per [facility] records, baseline creatinine from August 2025 was 2.08.   Presented with creatinine elevated 2.57. Creatinine somewhat better at 2.45 today.  Losartan held. Continue to follow up with nephrologist Dr. Jerrye at [facility]. Recent Labs  09/28/24 2324 09/30/24 0455  BUN 49* 44*  CREATININE 2.57* 2.45*  CO2 21* 25   GERD Pepcid   Hypothyroidism Continue Synthroid   Primary hyperparathyroidism   Depression PTA meds- Lexapro, Lamictal 50 mg nightly  Mobility: Independent at baseline  Goals of care:   Code Status: Full  Code    Diet:  Diet Order             Diet general           Diet Heart Room service appropriate? Yes; Fluid consistency: Thin  Diet effective now                   Nutritional status:  Body mass index is 28.22 kg/m.       Wounds:  -    Discharge Medications:   Allergies as of 09/30/2024   No Known Allergies      Medication List     STOP taking these medications    losartan 25 MG tablet Commonly known as: COZAAR       TAKE these medications    acetaminophen  500 MG tablet Commonly known as: TYLENOL  Take 1,000 mg by mouth every 6 (six) hours as needed for mild pain (pain score 1-3) or moderate pain (pain score 4-6).   amLODipine  5 MG tablet Commonly known as: NORVASC  Take 5 mg by mouth daily.   amoxicillin  500 MG tablet Commonly known as: AMOXIL  Take 4 tablets (2,000 mg) one hour prior to all dental visits.   aspirin  EC 81 MG tablet Take 81 mg by mouth daily.   atorvastatin  40 MG tablet Commonly known as: LIPITOR TAKE 1 TABLET BY MOUTH EVERYDAY AT BEDTIME   carboxymethylcellulose 0.5 % Soln Commonly known as: REFRESH PLUS Place 1 drop into both eyes 3 (three) times daily as needed (Dry eyes).   Cholecalciferol 25 MCG (1000 UT) capsule Take 1,000 Units by mouth in the morning.   cloNIDine  0.1 MG tablet Commonly known as: CATAPRES  Take 1 tablet (0.1 mg total) by mouth 2 (two) times daily.   escitalopram 5 MG tablet Commonly known as: LEXAPRO Take 5 mg by mouth at bedtime.   famotidine  40 MG tablet Commonly known as: PEPCID  Take 40 mg by mouth in the morning.   furosemide  40 MG tablet Commonly known as: LASIX  Take 40 mg by mouth 3 (three) times a week. Take one tablet by mouth three times per week on Tuesday, Wednesday, and Friday.   isosorbide  mononitrate 60 MG 24 hr tablet Commonly known as: IMDUR  Take 1 tablet (60 mg total) by mouth daily. Start taking on: October 01, 2024 What changed:  medication strength how much to  take   lamoTRIgine 25 MG tablet Commonly known as: LAMICTAL Take 1 tablet (25 mg total) by mouth at bedtime. What changed: how much to take   levothyroxine  50 MCG tablet Commonly known as: SYNTHROID  Take 50 mcg by mouth daily before breakfast.   nitroGLYCERIN  0.4 MG SL tablet Commonly known as: NITROSTAT  PLACE 1 TABLET UNDER THE TONGUE EVERY 5 MINUTES AS NEEDED FOR CHEST PAIN.         Follow ups:    Follow-up Information     Magdaline Debby HERO, MD Follow up.   Specialty: Family Medicine Contact information: [address] [place] [address] [phone]                 Discharge Instructions:   Discharge Instructions     Call MD for:  difficulty breathing,  headache or visual disturbances   Complete by: As directed    Call MD for:  extreme fatigue   Complete by: As directed    Call MD for:  hives   Complete by: As directed    Call MD for:  persistant dizziness or light-headedness   Complete by: As directed    Call MD for:  persistant nausea and vomiting   Complete by: As directed    Call MD for:  severe uncontrolled pain   Complete by: As directed    Call MD for:  temperature >100.4   Complete by: As directed    Diet general   Complete by: As directed    Discharge instructions   Complete by: As directed    Recommendations at discharge:   Losartan has been held because of acute impairment in kidney function.  Imdur  dose has been increased from 30 mg to 60 mg daily  Continue other medicines as before  Follow-up with cardiology and nephrology as an outpatient  Discharge instructions for CHF Check weight daily -preferably same time every day. Restrict fluid intake to 1200 ml daily Restrict salt intake to less than 2 g daily. Call MD if you have one of the following symptoms 1) 3 pound weight gain in 24 hours or 5 pounds in 1 week  2) swelling in the hands, feet or stomach  3) progressive shortness of breath 4) if you have to sleep  on extra pillows at night in order to breathe       General discharge instructions: Follow with Primary MD Magdaline Debby HERO, MD in 7 days  Please request your PCP  to go over your hospital tests, procedures, radiology results at the follow up. Please get your medicines reviewed and adjusted.  Your PCP may decide to repeat certain labs or tests as needed. Do not drive, operate heavy machinery, perform activities at heights, swimming or participation in water activities or provide baby sitting services if your were admitted for syncope or siezures until you have seen by Primary MD or a Neurologist and advised to do so again. [place]  Controlled Substance Reporting System database was reviewed. Do not drive, operate heavy machinery, perform activities at heights, swim, participate in water activities or provide baby-sitting services while on medications for pain, sleep and mood until your outpatient physician has reevaluated you and advised to do so again.  You are strongly recommended to comply with the dose, frequency and duration of prescribed medications. Activity: As tolerated with Full fall precautions use walker/cane & assistance as needed Avoid using any recreational substances like cigarette, tobacco, alcohol , or non-prescribed drug. If you experience worsening of your admission symptoms, develop shortness of breath, life threatening emergency, suicidal or homicidal thoughts you must seek medical attention immediately by calling 911 or calling your MD immediately  if symptoms less severe. You must read complete instructions/literature along with all the possible adverse reactions/side effects for all the medicines you take and that have been prescribed to you. Take any new medicine only after you have completely understood and accepted all the possible adverse reactions/side effects.  Wear Seat belts while driving. You were cared for by a hospitalist during your hospital stay. If you have  any questions about your discharge medications or the care you received while you were in the hospital after you are discharged, you can call the unit and ask to speak with the hospitalist or the covering physician. Once you are discharged, your primary care physician will  handle any further medical issues. Please note that NO REFILLS for any discharge medications will be authorized once you are discharged, as it is imperative that you return to your primary care physician (or establish a relationship with a primary care physician if you do not have one).   Increase activity slowly   Complete by: As directed        Discharge Exam:   Vitals:   09/29/24 2337 09/30/24 0306 09/30/24 0915 09/30/24 1221  BP: 139/66 136/60 (!) 173/64   Pulse: 71 (!) 53 (!) 51   Resp: 15 16 18 18   Temp: 98.2 F (36.8 C) (!) 97.5 F (36.4 C) 97.8 F (36.6 C) 98.4 F (36.9 C)  TempSrc: Oral Oral Oral Oral  SpO2: 100% 94% 98%   Weight: 79.3 kg     Height: 5' 6 (1.676 m)       Body mass index is 28.22 kg/m.   General exam: Pleasant, elderly Caucasian male. Skin: No rashes, lesions or ulcers. HEENT: Atraumatic, normocephalic, no obvious bleeding Lungs: Clear to auscultation bilaterally,  CVS: S1, S2, systolic murmur,   GI/Abd: Soft, nontender, nondistended, bowel sound present,   CNS: Alert, awake, oriented x 3 Psychiatry: Mood appropriate Extremities: No pedal edema, no calf tenderness,    The results of significant diagnostics from this hospitalization (including imaging, microbiology, ancillary and laboratory) are listed below for reference.    Procedures and Diagnostic Studies:   ECHOCARDIOGRAM COMPLETE Result Date: 09/29/2024    ECHOCARDIOGRAM REPORT   Patient Name:   Ryan Mendoza Date of Exam: 09/29/2024 Medical Rec #:  [id number]     Height:       66.0 in Accession #:    [id number]    Weight:       183.2 lb Date of Birth:  Apr 15, 1934    BSA:          1.927 m Patient Age:    89 years      BP:            149/52 mmHg Patient Gender: M             HR:           56 bpm. Exam Location:  Inpatient Procedure: 2D Echo, 3D Echo, Cardiac Doppler and Color Doppler (Both Spectral            and Color Flow Doppler were utilized during procedure). Indications:    Abnormal ECG 794.31/R94.31  History:        Patient has prior history of Echocardiogram examinations, most                 recent 06/23/2023. Previous Myocardial Infarction and CAD,                 Abnormal ECG, Mitral Valve Disease and s/p Mitral valve repair,                 there is a MitraClip present in the Mitral position, procedure                 date: 01/23/21, Signs/Symptoms:Chest Pain; Risk                 Factors:Hypertension, Dyslipidemia and Sleep Apnea.                  Mitral Valve: Mitra-Clip valve is present in the mitral                 position. Procedure  Date: 01/23/2021.  Sonographer:    Koleen Popper RDCS Referring Phys: [id number] Collin Rengel IMPRESSIONS  1. Left ventricular ejection fraction, by estimation, is 50 to 55%. Left ventricular ejection fraction by 3D volume is 49 %. The left ventricle has low normal function. The left ventricle demonstrates global hypokinesis. The left ventricular internal cavity size was moderately dilated. There is mild concentric left ventricular hypertrophy. Left ventricular diastolic parameters are indeterminate.  2. Right ventricular systolic function is normal. The right ventricular size is normal. There is normal pulmonary artery systolic pressure.  3. Right atrial size was mild to moderately dilated.  4. The mitral valve has been repaired/replaced. Moderate to severe mitral valve regurgitation. Mild mitral stenosis. The mean mitral valve gradient is 3.0 mmHg with average heart rate of 56 bpm. There is a Mitra-Clip present in the mitral position. Procedure Date: 01/23/2021. Echo findings are consistent with regurgitation the mitral prosthesis.  5. The aortic valve is tricuspid. Aortic valve regurgitation  is mild. Aortic valve sclerosis/calcification is present, without any evidence of aortic stenosis.  6. Aortic dilatation noted. There is mild dilatation of the ascending aorta, measuring 42 mm.  7. The inferior vena cava is normal in size with greater than 50% respiratory variability, suggesting right atrial pressure of 3 mmHg. Comparison(s): A prior study was performed on 06/23/2023. The LV cavity has dilated from 5.1 cm to 6.4 cm. Otherwise, no significant changes. Conclusion(s)/Recommendation(s): Findings concerning for moderate to severe mitral regurgitation in the setting of Mitra Clip, would recommend Transesophageal Echocardiogram for clarification. Critical findings reported to Chapman Rota, MD and acknowledged. FINDINGS  Left Ventricle: Left ventricular ejection fraction, by estimation, is 50 to 55%. Left ventricular ejection fraction by 3D volume is 49 %. The left ventricle has low normal function. The left ventricle demonstrates global hypokinesis. The left ventricular internal cavity size was moderately dilated. There is mild concentric left ventricular hypertrophy. Left ventricular diastolic function could not be evaluated due to mitral valve repair. Left ventricular diastolic parameters are indeterminate. Right Ventricle: The right ventricular size is normal. Right vetricular wall thickness was not well visualized. Right ventricular systolic function is normal. There is normal pulmonary artery systolic pressure. The tricuspid regurgitant velocity is 2.63 m/s, and with an assumed right atrial pressure of 3 mmHg, the estimated right ventricular systolic pressure is 30.7 mmHg. Left Atrium: Left atrial size was normal in size. Right Atrium: Right atrial size was mild to moderately dilated. Pericardium: There is no evidence of pericardial effusion. Mitral Valve: The mitral valve has been repaired/replaced. Moderate to severe mitral valve regurgitation, with eccentric anteriorly directed jet. There is a  Mitra-Clip present in the mitral position. Procedure Date: 01/23/2021. Echo findings are consistent with blood flow regurgitation of the mitral prosthesis. Mild mitral valve stenosis. MV peak gradient, 6.2 mmHg. The mean mitral valve gradient is 3.0 mmHg with average heart rate of 56 bpm. Tricuspid Valve: The tricuspid valve is normal in structure. Tricuspid valve regurgitation is mild. Aortic Valve: The aortic valve is tricuspid. Aortic valve regurgitation is mild. Aortic valve sclerosis/calcification is present, without any evidence of aortic stenosis. Pulmonic Valve: The pulmonic valve was not well visualized. Pulmonic valve regurgitation is mild. Aorta: The aortic root is normal in size and structure and aortic dilatation noted. There is mild dilatation of the ascending aorta, measuring 42 mm. Venous: The inferior vena cava is normal in size with greater than 50% respiratory variability, suggesting right atrial pressure of 3 mmHg. IAS/Shunts: The interatrial septum was not well  visualized. Additional Comments: 3D was performed not requiring image post processing on an independent workstation and was abnormal.  LEFT VENTRICLE PLAX 2D LVIDd:         6.40 cm         Diastology LVIDs:         4.40 cm         LV e' medial:    5.44 cm/s LV PW:         1.30 cm         LV E/e' medial:  22.6 LV IVS:        1.30 cm         LV e' lateral:   7.29 cm/s LVOT diam:     2.00 cm         LV E/e' lateral: 16.9 LV SV:         83 LV SV Index:   43 LVOT Area:     3.14 cm        3D Volume EF                                LV 3D EF:    Left                                             ventricul                                             ar                                             ejection                                             fraction                                             by 3D                                             volume is                                             49 %.                                 3D  Volume EF:                                3D  EF:        49 %                                LV EDV:       294 ml                                LV ESV:       149 ml                                LV SV:        145 ml RIGHT VENTRICLE             IVC RV S prime:     19.70 cm/s  IVC diam: 1.20 cm TAPSE (M-mode): 3.2 cm LEFT ATRIUM             Index        RIGHT ATRIUM           Index LA diam:        4.70 cm 2.44 cm/m   RA Area:     19.30 cm LA Vol (A2C):   47.9 ml 24.86 ml/m  RA Volume:   55.00 ml  28.55 ml/m LA Vol (A4C):   41.7 ml 21.64 ml/m LA Biplane Vol: 45.5 ml 23.62 ml/m  AORTIC VALVE LVOT Vmax:   115.00 cm/s LVOT Vmean:  72.800 cm/s LVOT VTI:    0.263 m  AORTA Ao Root diam: 3.30 cm Ao Asc diam:  4.20 cm MITRAL VALVE                TRICUSPID VALVE MV Area (PHT): 2.46 cm     TR Peak grad:   27.7 mmHg MV Area VTI:   1.78 cm     TR Vmax:        263.00 cm/s MV Peak grad:  6.2 mmHg MV Mean grad:  3.0 mmHg     SHUNTS MV Vmax:       1.24 m/s     Systemic VTI:  0.26 m MV Vmean:      77.7 cm/s    Systemic Diam: 2.00 cm MV Decel Time: 308 msec MV E velocity: 123.00 cm/s MV A velocity: 116.00 cm/s MV E/A ratio:  1.06 Emeline Calender Electronically signed by Emeline Calender Signature Date/Time: 09/29/2024/5:18:51 PM    Final    DG Chest 2 View Result Date: 09/28/2024 EXAM: 2 VIEW(S) XRAY OF THE CHEST 09/28/2024 11:29:46 PM COMPARISON: Chest x-ray 12:32:3  and CT chest 142 021. CLINICAL HISTORY: Chest pain chest pain. FINDINGS: LUNGS AND PLEURA: There is a small nodular density in the right upper lobe which is not definitely seen on prior. No pulmonary edema. No pleural effusion. No pneumothorax. HEART AND MEDIASTINUM: No acute abnormality of the cardiac and mediastinal silhouettes. BONES AND SOFT TISSUES: No acute osseous abnormality. IMPRESSION: 1. No acute findings. 2. Small nodular density in the right upper lobe, indeterminate. Recommend follow-up non-emergent chest CT. Electronically signed by: Greig Pique MD  09/28/2024 11:48 PM EDT RP Workstation: [id number]     Labs:   Basic Metabolic Panel: Recent Labs  Lab 09/28/24 2324 09/30/24 0455  NA 136 142  K 4.2 4.8  CL 105 107  CO2 21* 25  GLUCOSE 123* 112*  BUN 49* 44*  CREATININE 2.57* 2.45*  CALCIUM  8.9 9.0   GFR Estimated Creatinine Clearance: 21 mL/min (A) (by C-G formula based on SCr of 2.45 mg/dL (H)). Liver Function Tests: No results for input(s): AST, ALT, ALKPHOS, BILITOT, PROT, ALBUMIN in the last 168 hours. No results for input(s): LIPASE, AMYLASE in the last 168 hours. No results for input(s): AMMONIA in the last 168 hours. Coagulation profile Recent Labs  Lab 09/28/24 2324  INR 0.9    CBC: Recent Labs  Lab 09/28/24 2324 09/30/24 0455  WBC 8.1 6.4  HGB 13.2 12.0*  HCT 40.1 36.5*  MCV 88.9 88.8  PLT 170 163   Cardiac Enzymes: No results for input(s): CKTOTAL, CKMB, CKMBINDEX, TROPONINI in the last 168 hours. BNP: Invalid input(s): POCBNP CBG: No results for input(s): GLUCAP in the last 168 hours. D-Dimer No results for input(s): DDIMER in the last 72 hours. Hgb A1c No results for input(s): HGBA1C in the last 72 hours. Lipid Profile No results for input(s): CHOL, HDL, LDLCALC, TRIG, CHOLHDL, LDLDIRECT in the last 72 hours. Thyroid  function studies No results for input(s): TSH, T4TOTAL, T3FREE, THYROIDAB in the last 72 hours.  Invalid input(s): FREET3 Anemia work up No results for input(s): VITAMINB12, FOLATE, FERRITIN, TIBC, IRON, RETICCTPCT in the last 72 hours. Microbiology No results found for this or any previous visit (from the past 240 hours).  Time coordinating discharge: 45 minutes  Signed: Avraj Lindroth  [facility] 09/30/2024, 2:16 PM

## 2024-10-19 ENCOUNTER — Ambulatory Visit: Admitting: Cardiology

## 2024-10-19 ENCOUNTER — Other Ambulatory Visit: Payer: Self-pay | Admitting: Cardiovascular Disease

## 2024-10-20 MED ORDER — CLONIDINE HCL 0.1 MG PO TABS: 0.1000 mg | ORAL_TABLET | Freq: Two times a day (BID) | ORAL | 0 refills | Status: AC

## 2024-10-23 ENCOUNTER — Ambulatory Visit: Admitting: Physician Assistant

## 2024-10-23 ENCOUNTER — Encounter (HOSPITAL_COMMUNITY): Payer: Self-pay

## 2024-10-24 ENCOUNTER — Telehealth (HOSPITAL_COMMUNITY): Payer: Self-pay | Admitting: Emergency Medicine

## 2024-10-24 NOTE — Telephone Encounter (Signed)
Attempted to call patient regarding upcoming cardiac PET appointment. Left message on voicemail with name and callback number Aidan Moten RN Navigator Cardiac Imaging [facility] [phone] Office [phone] Cell  

## 2024-10-25 ENCOUNTER — Ambulatory Visit (HOSPITAL_COMMUNITY)
Admission: RE | Admit: 2024-10-25 | Discharge: 2024-10-25 | Disposition: A | Discharge status: Home / Self Care | Source: Ambulatory Visit | Attending: Cardiovascular Disease | Admitting: Cardiovascular Disease

## 2024-10-25 ENCOUNTER — Ambulatory Visit: Payer: Self-pay | Admitting: Cardiovascular Disease

## 2024-10-25 ENCOUNTER — Telehealth: Payer: Self-pay | Admitting: Cardiovascular Disease

## 2024-10-25 DIAGNOSIS — R072 Precordial pain: Secondary | ICD-10-CM | POA: Diagnosis not present

## 2024-10-25 DIAGNOSIS — I25119 Atherosclerotic heart disease of native coronary artery with unspecified angina pectoris: Secondary | ICD-10-CM | POA: Insufficient documentation

## 2024-10-25 DIAGNOSIS — R9389 Abnormal findings on diagnostic imaging of other specified body structures: Secondary | ICD-10-CM

## 2024-10-25 LAB — NM PET CT CARDIAC PERFUSION MULTI W/ABSOLUTE BLOODFLOW
Nuc Rest EF: 42 %
Nuc Stress EF: 48 %
Rest Nuclear Isotope Dose: 20.4 mCi
ST Depression (mm): 0 mm
Stress Nuclear Isotope Dose: 20.4 mCi
TID: 0.99

## 2024-10-25 MED ORDER — RUBIDIUM RB82 GENERATOR (RUBYFILL)
20.4400 | PACK | Freq: Once | INTRAVENOUS | Status: AC
  Administered 2024-10-25: 20.44 via INTRAVENOUS

## 2024-10-25 MED ORDER — REGADENOSON 0.4 MG/5ML IV SOLN
INTRAVENOUS | Status: AC
  Filled 2024-10-25: qty 5

## 2024-10-25 MED ORDER — REGADENOSON 0.4 MG/5ML IV SOLN
0.4000 mg | Freq: Once | INTRAVENOUS | Status: AC
  Administered 2024-10-25: 0.4 mg via INTRAVENOUS

## 2024-10-25 MED ORDER — RUBIDIUM RB82 GENERATOR (RUBYFILL)
20.4400 | PACK | Freq: Once | INTRAVENOUS | Status: AC
  Administered 2024-10-25: 20.43 via INTRAVENOUS

## 2024-10-25 NOTE — Telephone Encounter (Signed)
 Calling with results from PET CT. Please advise

## 2024-10-25 NOTE — Telephone Encounter (Signed)
 RN placed an order for MRI of the abdomen.  Notified patient's daughter  of the order per Dr Barbaraann.  She states she saw the information on Mychart.

## 2024-10-25 NOTE — Telephone Encounter (Signed)
 Please called this number back [phone] . Please advise

## 2024-10-25 NOTE — Telephone Encounter (Signed)
 Reviewed CT. He needs a MRI of his abdomen for liver mass. Can we arrange?   Signed, Darryle DASEN. Barbaraann, MD, [facility]  [facility]  [address] [place], [place] [address] [phone]

## 2024-10-25 NOTE — Telephone Encounter (Signed)
 Radiologist overread results from cardiac PET is in [facility] for provider review. Routed to Dr. Barbaraann and LOIS Ferretti RN

## 2024-11-07 NOTE — Telephone Encounter (Signed)
 Spoke with radiology dept at [facility]. Order and pt demographics faxed to number provided [phone]). Confirmation received of successful fax. Pt and family aware via mychart.

## 2024-11-08 ENCOUNTER — Ambulatory Visit: Admitting: Cardiology

## 2024-11-15 ENCOUNTER — Encounter: Payer: Self-pay | Admitting: Cardiovascular Disease

## 2024-11-15 NOTE — Telephone Encounter (Signed)
 Called Ryan Mendoza.  MRI concerning for possible liver malignancy.  It appears the biopsy is recommended.  I have recommended he discuss this with his primary care physician on next steps.  He may need to visit with oncology or GI first but I believe his primary care physician can help coordinate all of this.  I will also send a MyChart message show his daughter may be aware of the plan.  Signed, Darryle DASEN. Barbaraann, MD, [facility]  [facility]  [address] [place], [place] [address] [phone]  10:35 AM

## 2024-11-30 ENCOUNTER — Ambulatory Visit: Admitting: Physician Assistant

## 2024-12-05 ENCOUNTER — Ambulatory Visit: Admitting: Cardiovascular Disease

## 2024-12-21 NOTE — Progress Notes (Unsigned)
 " Cardiology Office Note:  .   Date:  12/21/2024  ID:  Ryan Mendoza, DOB 10-Dec-1934, MRN [id number] PCP: Ryan Debby HERO, MD  [facility] Providers Cardiologist:  Ryan ONEIDA Decent, MD   History of Present Illness: .   No chief complaint on file.   Ryan Mendoza is a 89 y.o. male with below history who presents for follow-up.   History of Present Illness               Problem List CAD -06/01/2016: PCI to mid LAD and mid LCX -LHC 05/2023: 50% ISR LCX 2. Severe MR  -prolapsing A1 and flail medial P2 -NTW [place] 01/23/2021 -mild to moderate residual MR 3. Mild to moderate AI 4. HTN 5. HLD -T chol 113, LDL 66, HDL 37, TG 97 6. CKD Stage 3b 7. Liver Cancer     ROS: All other ROS reviewed and negative. Pertinent positives noted in the HPI.     Studies Reviewed: [facility]RA       LHC 06/01/2023 Stable CAD - no change from 2022 1. Patent left main with no significant stenosis (~20% dLM) 2. Patent LAD stent with mild nonobstructive plaquing in the LAD - tapers to small caliber vessel distally 3. Patent left circumflex stent with 50% diffuse in-stent restenosis and 50-60% ostial circumflex stenosis, no high-grade disease throughout the circumflex distribution (no notable change from prior cath) 4. Diffuse nonobstructive RCA stenosis (large dominant vessel) 5. Normal LVEDP with systemic HTN  NM PET 10/25/2024   Normal perfusion.  Myocardial blood flow reserve is mildly reduced (1.86) but can be inaccurate in setting of prior coronary stenting.  LVEF mildly reduced (42% at rest, 48% at stress).  No high risk findings such as TID or drop in EF with stress.  Overall, study is low risk   LV perfusion is normal. There is no evidence of ischemia. There is no evidence of infarction.   Rest left ventricular function is abnormal. Rest global function is mildly reduced. Rest EF: 42%. Stress left ventricular function is abnormal. Stress global function is mildly reduced. Stress EF: 48%. End  diastolic cavity size is normal. End systolic cavity size is normal.   Myocardial blood flow reserve is not reported in this patient due to technical or patient-specific concerns that affect accuracy.   Coronary calcium  assessment not performed due to prior revascularization.   The study is normal. The study is low risk.   Electronically signed by Ryan Nanas, MD    Physical Exam:   VS:  There were no vitals taken for this visit.   Wt Readings from Last 3 Encounters:  09/29/24 174 lb 13.2 oz (79.3 kg)  11/26/23 183 lb 3.2 oz (83.1 kg)  06/01/23 170 lb (77.1 kg)    GEN: Well nourished, well developed in no acute distress NECK: No JVD; No carotid bruits CARDIAC: ***RRR, no murmurs, rubs, gallops RESPIRATORY:  Clear to auscultation without rales, wheezing or rhonchi  ABDOMEN: Soft, non-tender, non-distended EXTREMITIES:  No edema; No deformity  ASSESSMENT AND PLAN: .   Assessment and Plan                 {Are you ordering a CV Procedure (e.g. stress test, cath, DCCV, TEE, etc)?   Press F2        :[id number]}   Follow-up: No follow-ups on file.  Signed, Ryan ONEIDA. Decent, MD, [facility]  [facility]  [address] [place], [place] [address] [phone]  5:55  PM   "

## 2024-12-25 ENCOUNTER — Ambulatory Visit: Admitting: Cardiovascular Disease

## 2024-12-25 DIAGNOSIS — I25119 Atherosclerotic heart disease of native coronary artery with unspecified angina pectoris: Secondary | ICD-10-CM

## 2024-12-25 DIAGNOSIS — I351 Nonrheumatic aortic (valve) insufficiency: Secondary | ICD-10-CM

## 2024-12-25 DIAGNOSIS — I340 Nonrheumatic mitral (valve) insufficiency: Secondary | ICD-10-CM

## 2024-12-25 DIAGNOSIS — I10 Essential (primary) hypertension: Secondary | ICD-10-CM

## 2024-12-25 DIAGNOSIS — Z95818 Presence of other cardiac implants and grafts: Secondary | ICD-10-CM

## 2024-12-28 ENCOUNTER — Encounter: Payer: Self-pay | Admitting: Cardiovascular Disease
